# Patient Record
Sex: Female | Born: 1955 | Race: White | Hispanic: No | Marital: Married | Smoking: Never smoker
Health system: Southern US, Community
[De-identification: ages and names within clinical notes are randomized; demographics above are authoritative.]

## PROBLEM LIST (undated history)

## (undated) DIAGNOSIS — C4491 Basal cell carcinoma of skin, unspecified: Secondary | ICD-10-CM

## (undated) DIAGNOSIS — H04123 Dry eye syndrome of bilateral lacrimal glands: Secondary | ICD-10-CM

## (undated) DIAGNOSIS — F419 Anxiety disorder, unspecified: Secondary | ICD-10-CM

## (undated) DIAGNOSIS — M069 Rheumatoid arthritis, unspecified: Secondary | ICD-10-CM

## (undated) DIAGNOSIS — M1990 Unspecified osteoarthritis, unspecified site: Secondary | ICD-10-CM

## (undated) DIAGNOSIS — I499 Cardiac arrhythmia, unspecified: Secondary | ICD-10-CM

## (undated) DIAGNOSIS — F32A Depression, unspecified: Secondary | ICD-10-CM

## (undated) DIAGNOSIS — R200 Anesthesia of skin: Secondary | ICD-10-CM

## (undated) DIAGNOSIS — F329 Major depressive disorder, single episode, unspecified: Secondary | ICD-10-CM

## (undated) DIAGNOSIS — G4730 Sleep apnea, unspecified: Secondary | ICD-10-CM

## (undated) DIAGNOSIS — O2230 Deep phlebothrombosis in pregnancy, unspecified trimester: Secondary | ICD-10-CM

## (undated) HISTORY — DX: Basal cell carcinoma of skin, unspecified: C44.91

## (undated) HISTORY — DX: Anxiety disorder, unspecified: F41.9

## (undated) HISTORY — DX: Rheumatoid arthritis, unspecified: M06.9

## (undated) HISTORY — DX: Deep phlebothrombosis in pregnancy, unspecified trimester: O22.30

## (undated) HISTORY — DX: Depression, unspecified: F32.A

## (undated) HISTORY — DX: Major depressive disorder, single episode, unspecified: F32.9

---

## 1987-04-06 HISTORY — PX: KNEE ARTHROSCOPY: SUR90

## 2004-04-05 HISTORY — PX: ENDOMETRIAL ABLATION: SHX621

## 2004-04-05 HISTORY — PX: FACIAL COSMETIC SURGERY: SHX629

## 2007-04-06 DIAGNOSIS — O2230 Deep phlebothrombosis in pregnancy, unspecified trimester: Secondary | ICD-10-CM

## 2007-04-06 HISTORY — PX: KNEE ARTHROSCOPY: SUR90

## 2007-04-06 HISTORY — DX: Deep phlebothrombosis in pregnancy, unspecified trimester: O22.30

## 2007-06-06 ENCOUNTER — Ambulatory Visit (HOSPITAL_COMMUNITY): Admission: RE | Admit: 2007-06-06 | Discharge: 2007-06-06 | Payer: Self-pay | Admitting: Orthopedic Surgery

## 2007-06-06 ENCOUNTER — Encounter (INDEPENDENT_AMBULATORY_CARE_PROVIDER_SITE_OTHER): Payer: Self-pay | Admitting: Orthopedic Surgery

## 2007-06-06 ENCOUNTER — Ambulatory Visit: Payer: Self-pay | Admitting: Vascular Surgery

## 2007-06-06 ENCOUNTER — Encounter: Payer: Self-pay | Admitting: Internal Medicine

## 2007-06-07 ENCOUNTER — Telehealth: Payer: Self-pay | Admitting: Internal Medicine

## 2007-06-07 ENCOUNTER — Ambulatory Visit: Payer: Self-pay | Admitting: Internal Medicine

## 2007-06-07 DIAGNOSIS — I82409 Acute embolism and thrombosis of unspecified deep veins of unspecified lower extremity: Secondary | ICD-10-CM | POA: Insufficient documentation

## 2007-06-07 DIAGNOSIS — Z86718 Personal history of other venous thrombosis and embolism: Secondary | ICD-10-CM | POA: Insufficient documentation

## 2007-06-07 LAB — CONVERTED CEMR LAB
Albumin: 4 g/dL (ref 3.5–5.2)
Basophils Absolute: 0 10*3/uL (ref 0.0–0.1)
Bilirubin, Direct: 0.1 mg/dL (ref 0.0–0.3)
Calcium: 9.5 mg/dL (ref 8.4–10.5)
Chloride: 102 meq/L (ref 96–112)
Cholesterol: 185 mg/dL (ref 0–200)
Eosinophils Absolute: 0.3 10*3/uL (ref 0.0–0.6)
Eosinophils Relative: 4.5 % (ref 0.0–5.0)
GFR calc Af Amer: 113 mL/min
GFR calc non Af Amer: 93 mL/min
Glucose, Bld: 89 mg/dL (ref 70–99)
Glucose, Urine, Semiquant: NEGATIVE
INR: 1
Lymphocytes Relative: 26.3 % (ref 12.0–46.0)
MCHC: 34.2 g/dL (ref 30.0–36.0)
MCV: 92.9 fL (ref 78.0–100.0)
Neutro Abs: 3.4 10*3/uL (ref 1.4–7.7)
Neutrophils Relative %: 61.2 % (ref 43.0–77.0)
Nitrite: NEGATIVE
Platelets: 220 10*3/uL (ref 150–400)
Prothrombin Time: 12.2 s
RBC: 4.33 M/uL (ref 3.87–5.11)
Sodium: 137 meq/L (ref 135–145)
Specific Gravity, Urine: 1.02
TSH: 1.35 microintl units/mL (ref 0.35–5.50)
Total CHOL/HDL Ratio: 3.4
Triglycerides: 83 mg/dL (ref 0–149)
WBC Urine, dipstick: NEGATIVE
WBC: 5.6 10*3/uL (ref 4.5–10.5)
pH: 6

## 2007-06-09 ENCOUNTER — Ambulatory Visit: Payer: Self-pay | Admitting: Internal Medicine

## 2007-06-13 ENCOUNTER — Ambulatory Visit: Payer: Self-pay | Admitting: Internal Medicine

## 2007-06-15 ENCOUNTER — Other Ambulatory Visit: Admission: RE | Admit: 2007-06-15 | Discharge: 2007-06-15 | Payer: Self-pay | Admitting: Obstetrics & Gynecology

## 2007-06-15 ENCOUNTER — Ambulatory Visit: Payer: Self-pay | Admitting: Internal Medicine

## 2007-06-15 DIAGNOSIS — R7401 Elevation of levels of liver transaminase levels: Secondary | ICD-10-CM | POA: Insufficient documentation

## 2007-06-15 DIAGNOSIS — R7402 Elevation of levels of lactic acid dehydrogenase (LDH): Secondary | ICD-10-CM | POA: Insufficient documentation

## 2007-06-15 DIAGNOSIS — IMO0002 Reserved for concepts with insufficient information to code with codable children: Secondary | ICD-10-CM | POA: Insufficient documentation

## 2007-06-15 DIAGNOSIS — R740 Nonspecific elevation of levels of transaminase and lactic acid dehydrogenase [LDH]: Secondary | ICD-10-CM

## 2007-06-15 DIAGNOSIS — J309 Allergic rhinitis, unspecified: Secondary | ICD-10-CM | POA: Insufficient documentation

## 2007-06-15 LAB — CONVERTED CEMR LAB
HCV Ab: NEGATIVE
Hep A IgM: NEGATIVE
Hep B C IgM: NEGATIVE
Hepatitis B Surface Ag: NEGATIVE
INR: 2.8
Prothrombin Time: 20.2 s

## 2007-06-16 LAB — CONVERTED CEMR LAB
ALT: 72 units/L — ABNORMAL HIGH (ref 0–35)
AST: 29 units/L (ref 0–37)
Albumin: 4.1 g/dL (ref 3.5–5.2)
Alkaline Phosphatase: 49 units/L (ref 39–117)
Saturation Ratios: 30.5 % (ref 20.0–50.0)

## 2007-07-03 ENCOUNTER — Ambulatory Visit: Payer: Self-pay | Admitting: Cardiovascular Disease

## 2007-07-03 ENCOUNTER — Ambulatory Visit: Payer: Self-pay | Admitting: Internal Medicine

## 2007-07-05 ENCOUNTER — Ambulatory Visit: Payer: Self-pay | Admitting: Internal Medicine

## 2007-07-05 DIAGNOSIS — J0120 Acute ethmoidal sinusitis, unspecified: Secondary | ICD-10-CM | POA: Insufficient documentation

## 2007-07-05 LAB — CONVERTED CEMR LAB
AST: 18 units/L (ref 0–37)
Albumin: 4.1 g/dL (ref 3.5–5.2)
Total Bilirubin: 1.2 mg/dL (ref 0.3–1.2)

## 2007-07-11 ENCOUNTER — Encounter: Payer: Self-pay | Admitting: Internal Medicine

## 2007-08-03 ENCOUNTER — Ambulatory Visit: Payer: Self-pay | Admitting: Internal Medicine

## 2007-08-03 LAB — CONVERTED CEMR LAB
INR: 3.1
Prothrombin Time: 21.3 s

## 2007-08-23 ENCOUNTER — Ambulatory Visit: Payer: Self-pay

## 2007-08-23 ENCOUNTER — Ambulatory Visit: Payer: Self-pay | Admitting: Cardiovascular Disease

## 2007-08-30 ENCOUNTER — Telehealth: Payer: Self-pay | Admitting: Internal Medicine

## 2007-09-01 ENCOUNTER — Ambulatory Visit: Payer: Self-pay | Admitting: Internal Medicine

## 2007-09-06 ENCOUNTER — Ambulatory Visit: Payer: Self-pay | Admitting: Internal Medicine

## 2007-09-06 LAB — CONVERTED CEMR LAB
INR: 1.1
Prothrombin Time: 13.3 s

## 2007-09-11 LAB — CONVERTED CEMR LAB: Protein S Ag, Total: 92 % (ref 70–140)

## 2007-09-12 ENCOUNTER — Encounter: Payer: Self-pay | Admitting: Internal Medicine

## 2007-10-11 ENCOUNTER — Telehealth: Payer: Self-pay | Admitting: Internal Medicine

## 2007-10-12 ENCOUNTER — Ambulatory Visit: Payer: Self-pay | Admitting: Internal Medicine

## 2007-10-12 DIAGNOSIS — J329 Chronic sinusitis, unspecified: Secondary | ICD-10-CM | POA: Insufficient documentation

## 2007-10-13 ENCOUNTER — Encounter: Admission: RE | Admit: 2007-10-13 | Discharge: 2007-10-13 | Payer: Self-pay | Admitting: Internal Medicine

## 2007-10-27 ENCOUNTER — Telehealth: Payer: Self-pay | Admitting: Internal Medicine

## 2007-11-02 ENCOUNTER — Ambulatory Visit: Payer: Self-pay | Admitting: Internal Medicine

## 2007-11-02 LAB — CONVERTED CEMR LAB: Anticardiolipin IgM: <7 (ref <10)

## 2007-11-08 ENCOUNTER — Ambulatory Visit: Payer: Self-pay | Admitting: Cardiology

## 2007-11-10 ENCOUNTER — Telehealth: Payer: Self-pay | Admitting: Internal Medicine

## 2007-11-13 ENCOUNTER — Telehealth: Payer: Self-pay | Admitting: Internal Medicine

## 2007-11-14 ENCOUNTER — Telehealth: Payer: Self-pay | Admitting: Internal Medicine

## 2007-11-24 ENCOUNTER — Telehealth: Payer: Self-pay | Admitting: Internal Medicine

## 2007-11-28 ENCOUNTER — Ambulatory Visit: Payer: Self-pay | Admitting: Internal Medicine

## 2007-11-28 DIAGNOSIS — M67919 Unspecified disorder of synovium and tendon, unspecified shoulder: Secondary | ICD-10-CM | POA: Insufficient documentation

## 2007-11-28 DIAGNOSIS — M719 Bursopathy, unspecified: Secondary | ICD-10-CM

## 2007-12-04 ENCOUNTER — Telehealth: Payer: Self-pay | Admitting: Internal Medicine

## 2007-12-07 ENCOUNTER — Encounter: Admission: RE | Admit: 2007-12-07 | Discharge: 2007-12-07 | Payer: Self-pay | Admitting: Internal Medicine

## 2007-12-13 ENCOUNTER — Encounter: Payer: Self-pay | Admitting: Internal Medicine

## 2008-01-04 HISTORY — PX: NASAL POLYP SURGERY: SHX186

## 2008-01-10 ENCOUNTER — Encounter: Payer: Self-pay | Admitting: Internal Medicine

## 2008-01-30 ENCOUNTER — Encounter: Payer: Self-pay | Admitting: Internal Medicine

## 2008-02-14 ENCOUNTER — Encounter: Payer: Self-pay | Admitting: Internal Medicine

## 2008-03-20 ENCOUNTER — Encounter: Payer: Self-pay | Admitting: Internal Medicine

## 2008-04-09 ENCOUNTER — Telehealth: Payer: Self-pay | Admitting: Internal Medicine

## 2008-04-09 ENCOUNTER — Ambulatory Visit: Payer: Self-pay | Admitting: Internal Medicine

## 2008-04-09 DIAGNOSIS — N3000 Acute cystitis without hematuria: Secondary | ICD-10-CM | POA: Insufficient documentation

## 2008-04-10 ENCOUNTER — Encounter: Payer: Self-pay | Admitting: Internal Medicine

## 2008-04-12 ENCOUNTER — Telehealth: Payer: Self-pay | Admitting: Internal Medicine

## 2008-05-01 ENCOUNTER — Encounter: Payer: Self-pay | Admitting: Internal Medicine

## 2008-06-26 ENCOUNTER — Other Ambulatory Visit: Admission: RE | Admit: 2008-06-26 | Discharge: 2008-06-26 | Payer: Self-pay | Admitting: Obstetrics & Gynecology

## 2008-07-30 ENCOUNTER — Encounter: Payer: Self-pay | Admitting: Internal Medicine

## 2008-11-26 ENCOUNTER — Encounter: Payer: Self-pay | Admitting: Internal Medicine

## 2009-01-29 ENCOUNTER — Telehealth: Payer: Self-pay | Admitting: Internal Medicine

## 2009-05-08 ENCOUNTER — Ambulatory Visit: Payer: Self-pay | Admitting: Internal Medicine

## 2009-05-26 ENCOUNTER — Telehealth: Payer: Self-pay | Admitting: Internal Medicine

## 2009-06-05 ENCOUNTER — Ambulatory Visit: Payer: Self-pay | Admitting: Internal Medicine

## 2009-07-29 ENCOUNTER — Encounter: Payer: Self-pay | Admitting: Internal Medicine

## 2009-08-01 LAB — CONVERTED CEMR LAB: Pap Smear: NORMAL

## 2009-09-04 ENCOUNTER — Ambulatory Visit: Payer: Self-pay | Admitting: Internal Medicine

## 2009-09-04 DIAGNOSIS — R635 Abnormal weight gain: Secondary | ICD-10-CM | POA: Insufficient documentation

## 2009-12-18 ENCOUNTER — Ambulatory Visit: Payer: Self-pay | Admitting: Internal Medicine

## 2010-03-10 ENCOUNTER — Ambulatory Visit: Payer: Self-pay | Admitting: Internal Medicine

## 2010-03-10 LAB — CONVERTED CEMR LAB
ALT: 18 units/L (ref 0–35)
AST: 19 units/L (ref 0–37)
Albumin: 4.4 g/dL (ref 3.5–5.2)
Alkaline Phosphatase: 62 units/L (ref 39–117)
BUN: 14 mg/dL (ref 6–23)
Basophils Absolute: 0 10*3/uL (ref 0.0–0.1)
Calcium: 9.3 mg/dL (ref 8.4–10.5)
Chloride: 102 meq/L (ref 96–112)
Cholesterol: 236 mg/dL — ABNORMAL HIGH (ref 0–200)
Creatinine, Ser: 0.6 mg/dL (ref 0.4–1.2)
Eosinophils Absolute: 0.2 10*3/uL (ref 0.0–0.7)
Eosinophils Relative: 3.7 % (ref 0.0–5.0)
MCHC: 34.2 g/dL (ref 30.0–36.0)
MCV: 91.1 fL (ref 78.0–100.0)
Monocytes Absolute: 0.4 10*3/uL (ref 0.1–1.0)
Neutrophils Relative %: 57.4 % (ref 43.0–77.0)
Nitrite: NEGATIVE
Platelets: 198 10*3/uL (ref 150.0–400.0)
Protein, U semiquant: NEGATIVE
TSH: 2.14 microintl units/mL (ref 0.35–5.50)
Total Protein: 7.2 g/dL (ref 6.0–8.3)
Triglycerides: 99 mg/dL (ref 0.0–149.0)
Urobilinogen, UA: 0.2
VLDL: 19.8 mg/dL (ref 0.0–40.0)
WBC: 4.7 10*3/uL (ref 4.5–10.5)

## 2010-03-23 ENCOUNTER — Ambulatory Visit: Payer: Self-pay | Admitting: Internal Medicine

## 2010-03-23 DIAGNOSIS — E785 Hyperlipidemia, unspecified: Secondary | ICD-10-CM | POA: Insufficient documentation

## 2010-03-23 DIAGNOSIS — N943 Premenstrual tension syndrome: Secondary | ICD-10-CM | POA: Insufficient documentation

## 2010-03-23 DIAGNOSIS — F988 Other specified behavioral and emotional disorders with onset usually occurring in childhood and adolescence: Secondary | ICD-10-CM | POA: Insufficient documentation

## 2010-03-23 LAB — HM COLONOSCOPY

## 2010-03-24 ENCOUNTER — Telehealth: Payer: Self-pay | Admitting: *Deleted

## 2010-04-17 ENCOUNTER — Telehealth: Payer: Self-pay | Admitting: Internal Medicine

## 2010-05-05 NOTE — Assessment & Plan Note (Signed)
Summary: 1 month fup//ccm   Vital Signs:  Patient profile:   55 year old female Height:      66 inches Weight:      185 pounds BMI:     29.97 Temp:     98.2 degrees F oral Pulse rate:   76 / minute Resp:     14 per minute BP sitting:   132 / 80  (left arm)  Vitals Entered By: Willy Eddy, LPN (June 06, 4130 12:10 PM) CC: roa- -wellbutrin caused hives which are improving but contniues to take benadryl each night- pristiq is working well with no side effects   Primary Care Provider:  [name] MD  CC:  roa- -wellbutrin caused hives which are improving but contniues to take benadryl each night- pristiq is working well with no side effects.  History of Present Illness: Just had a flu like ilness good response to  pristiq mild episodes of SOB with walking but can do the treadmill without and new SOB  Preventive Screening-Counseling & Management  Alcohol-Tobacco     Smoking Status: never     Passive Smoke Exposure: no  Current Problems (verified): 1)  ? of Obstructive Sleep Apnea  (ICD-327.23) 2)  Acute Cystitis  (ICD-595.0) 3)  Rotator Cuff Syndrome, Left  (ICD-726.10) 4)  Sinusitis, Chronic  (ICD-473.9) 5)  Acute Ethmoidal Sinusitis  (ICD-461.2) 6)  Transaminases, Serum, Elevated  (ICD-790.4) 7)  Medial Meniscus Tear, Left  (ICD-836.0) 8)  Depression  (ICD-311) 9)  Allergic Rhinitis  (ICD-477.9) 10)  Coumadin Therapy  (ICD-V58.61) 11)  Encounter For Therapeutic Drug Monitoring  (ICD-V58.83) 12)  Dvt  (ICD-453.40) 13)  Physical Examination  (ICD-V70.0)  Current Medications (verified): 1)  Lexapro 20 Mg Tabs (Escitalopram Oxalate) .Marland Kitchen.. 1 Once Daily 2)  Omnaris 50 Mcg/act  Susp (Ciclesonide) .... Two Spary To Each Nostril Once A Day 3)  Pristiq 50 Mg Xr24h-Tab (Desvenlafaxine Succinate) .... One By Mouth Dailt  Allergies (verified): 1)  ! Wellbutrin Xl (Bupropion Hcl)  Past History:  Family History: Last updated: 05/08/2009 Father: living-51 years  old Mother: living 63years old Siblings: 2 brothers that are healthy  daughter with elevated testosterone  Social History: Last updated: 06/15/2007 Married Never Smoked  Risk Factors: Smoking Status: never (06/05/2009) Passive Smoke Exposure: no (06/05/2009)  Past medical, surgical, family and social histories (including risk factors) reviewed, and no changes noted (except as noted below).  Past Medical History: Reviewed history from 11/28/2007 and no changes required. Allergic rhinitis Depression meniscal tear  Past Surgical History: Reviewed history from 08/03/2007 and no changes required. arthroscopy for knee  Family History: Reviewed history from 05/08/2009 and no changes required. Father: living-16 years old Mother: living 38years old Siblings: 2 brothers that are healthy  daughter with elevated testosterone  Social History: Reviewed history from 06/15/2007 and no changes required. Married Never Smoked  Review of Systems  The patient denies anorexia, fever, weight loss, weight gain, vision loss, decreased hearing, hoarseness, chest pain, syncope, dyspnea on exertion, peripheral edema, prolonged cough, headaches, hemoptysis, abdominal pain, melena, severe indigestion/heartburn, hematuria, incontinence, genital sores, muscle weakness, suspicious skin lesions, transient blindness, difficulty walking, depression, unusual weight change, abnormal bleeding, enlarged lymph nodes, angioedema, and breast masses.    Physical Exam  General:  Well-developed, well-nourished, normal body habitus; no deformities, normal grooming.   Eyes:  pupils equal and pupils round.   Ears:  R ear normal and L ear normal.   Nose:  mucosal edema, airflow obstruction, and  R nasal polyp.   Mouth:  Oral mucosa and oropharynx without lesions or exudates.  Teeth in good repair. Neck:  No deformities, masses, or tenderness noted. Lungs:  Normal respiratory effort, chest expands symmetrically. Lungs  are clear to auscultation, no crackles or wheezes. Heart:  Normal rate and regular rhythm. S1 and S2 normal without gallop, murmur, click, rub or other extra sounds.   Impression & Recommendations:  Problem # 1:  DEPRESSION (ICD-311)  The following medications were removed from the medication list:    Lexapro 20 Mg Tabs (Escitalopram oxalate) .Marland Kitchen... 1 once daily Her updated medication list for this problem includes:    Pristiq 50 Mg Xr24h-tab (Desvenlafaxine succinate) ..... One by mouth dailt  Discussed treatment options, including trial of antidpressant medication. Will refer to behavioral health. Follow-up call in in 24-48 hours and recheck in 2 weeks, sooner as needed. Patient agrees to call if any worsening of symptoms or thoughts of doing harm arise. Verified that the patient has no suicidal ideation at this time.   Problem # 2:  COUMADIN THERAPY (ICD-V58.61)  Complete Medication List: 1)  Omnaris 50 Mcg/act Susp (Ciclesonide) .... Two spary to each nostril once a day 2)  Pristiq 50 Mg Xr24h-tab (Desvenlafaxine succinate) .... One by mouth dailt 3)  Phentermine Hcl 37.5 Mg Caps (Phentermine hcl) .... One by mouth q am  Patient Instructions: 1)  Please schedule a follow-up appointment in 3 months. 2)  DASH diet Prescriptions: PHENTERMINE HCL 37.5 MG CAPS (PHENTERMINE HCL) one by mouth q AM  #30 x 2   Entered and Authorized by:   [name] MD   Signed by:   [name] MD on 06/05/2009   Method used:   Print then Give to Patient   RxID:   [id number]

## 2010-05-05 NOTE — Progress Notes (Signed)
Summary: switch meds?  Phone Note Call from Patient   Caller: Patient Call For: [name] MD Summary of Call: started back on Wellbutrin 3 weeks ago- hands and feet are itching. Should she give it more time or switch to another med? [facility] her ph [phone] Initial call taken by: Raechel Ache, RN,  May 26, 2009 10:22 AM  Follow-up for Phone Call        trial of pristiq 50 mg  add to med list to replace welbutrin may call in one by mouth daily Follow-up by: [name] MD,  May 26, 2009 2:31 PM    New/Updated Medications: PRISTIQ 50 MG XR24H-TAB (DESVENLAFAXINE SUCCINATE) one by mouth dailt Prescriptions: PRISTIQ 50 MG XR24H-TAB (DESVENLAFAXINE SUCCINATE) one by mouth dailt  #50 x 1   Entered by:   Lynann Beaver CMA   Authorized by:   [name] MD   Signed by:   Lynann Beaver CMA on 05/26/2009   Method used:   Electronically to        [id number]* (retail)       [address]       [place], [place]  [address]       Ph: [phone]       Fax: [phone]   RxID:   [id number]

## 2010-05-05 NOTE — Letter (Signed)
Summary: [facility]  [facility]   Imported By: Maryln Gottron 08/06/2009 14:43:26  _____________________________________________________________________  External Attachment:    Type:   Image     Comment:   External Document

## 2010-05-05 NOTE — Assessment & Plan Note (Signed)
Summary: CONCERNS ABOUT SLEEP APNEA // RS   Vital Signs:  Patient profile:   55 year old female Height:      66 inches Weight:      188 pounds BMI:     30.45 Temp:     98.2 degrees F oral Pulse rate:   76 / minute Resp:     14 per minute BP sitting:   134 / 80  (left arm)  Vitals Entered By: Willy Eddy, LPN (May 08, 2009 12:55 PM)  Nutrition Counseling: Patient's BMI is greater than 25 and therefore counseled on weight management options. CC: to discuss blood clotting problem/c/o snoring   Primary Care Provider:  [name] MD  CC:  to discuss blood clotting problem/c/o snoring.  History of Present Illness: OV to discuss hx of DVT and   her desire to consider HRT OV to reviewed apnea pt possible has obstructive type apnea with snoring due to hx of nasal polyps ( see [facility] ENt NOTE) She does not have excessive daytime sleepyness at this time but snores loudly at night she is not obese I have spent greater that 30 min face to face evaluating this patient over 1/2 was counsilling  Preventive Screening-Counseling & Management  Alcohol-Tobacco     Smoking Status: never     Passive Smoke Exposure: no  Problems Prior to Update: 1)  Acute Cystitis  (ICD-595.0) 2)  Rotator Cuff Syndrome, Left  (ICD-726.10) 3)  Sinusitis, Chronic  (ICD-473.9) 4)  Acute Ethmoidal Sinusitis  (ICD-461.2) 5)  Transaminases, Serum, Elevated  (ICD-790.4) 6)  Medial Meniscus Tear, Left  (ICD-836.0) 7)  Depression  (ICD-311) 8)  Allergic Rhinitis  (ICD-477.9) 9)  Coumadin Therapy  (ICD-V58.61) 10)  Encounter For Therapeutic Drug Monitoring  (ICD-V58.83) 11)  Dvt  (ICD-453.40) 12)  Physical Examination  (ICD-V70.0)  Current Problems (verified): 1)  Acute Cystitis  (ICD-595.0) 2)  Rotator Cuff Syndrome, Left  (ICD-726.10) 3)  Sinusitis, Chronic  (ICD-473.9) 4)  Acute Ethmoidal Sinusitis  (ICD-461.2) 5)  Transaminases, Serum, Elevated  (ICD-790.4) 6)  Medial Meniscus Tear,  Left  (ICD-836.0) 7)  Depression  (ICD-311) 8)  Allergic Rhinitis  (ICD-477.9) 9)  Coumadin Therapy  (ICD-V58.61) 10)  Encounter For Therapeutic Drug Monitoring  (ICD-V58.83) 11)  Dvt  (ICD-453.40) 12)  Physical Examination  (ICD-V70.0)  Medications Prior to Update: 1)  Prozac 20 Mg  Caps (Fluoxetine Hcl) .... Once Daily 2)  Omnaris 50 Mcg/act  Susp (Ciclesonide) .... Two Spary To Each Nostril Once A Day 3)  Prednisone 2.5 Mg Tabs (Prednisone) .Marland Kitchen.. 1 Once Daily Titrating Off Since Nasal Surgery 4)  Levaquin 750 Mg Tabs (Levofloxacin) .... One By Mouth Daily For 3 Days  Current Medications (verified): 1)  Lexapro 20 Mg Tabs (Escitalopram Oxalate) .Marland Kitchen.. 1 Once Daily 2)  Omnaris 50 Mcg/act  Susp (Ciclesonide) .... Two Spary To Each Nostril Once A Day 3)  Bupropion Hcl 150 Mg Xr24h-Tab (Bupropion Hcl) .... One By Mouth Q Am  Allergies (verified): No Known Drug Allergies  Past History:  Family History: Last updated: 05/08/2009 Father: living-69 years old Mother: living 81years old Siblings: 2 brothers that are healthy  daughter with elevated testosterone  Social History: Last updated: 06/15/2007 Married Never Smoked  Risk Factors: Smoking Status: never (05/08/2009) Passive Smoke Exposure: no (05/08/2009)  Past medical, surgical, family and social histories (including risk factors) reviewed, and no changes noted (except as noted below).  Past Medical History: Reviewed history from 11/28/2007 and no  changes required. Allergic rhinitis Depression meniscal tear  Past Surgical History: Reviewed history from 08/03/2007 and no changes required. arthroscopy for knee  Family History: Reviewed history from 06/15/2007 and no changes required. Father: living-38 years old Mother: living 28years old Siblings: 2 brothers that are healthy  daughter with elevated testosterone  Social History: Reviewed history from 06/15/2007 and no changes required. Married Never  Smoked  Review of Systems  The patient denies anorexia, fever, weight loss, weight gain, vision loss, decreased hearing, hoarseness, chest pain, syncope, dyspnea on exertion, peripheral edema, prolonged cough, headaches, hemoptysis, abdominal pain, melena, hematochezia, severe indigestion/heartburn, hematuria, incontinence, genital sores, muscle weakness, suspicious skin lesions, transient blindness, difficulty walking, depression, unusual weight change, abnormal bleeding, enlarged lymph nodes, angioedema, and breast masses.    Physical Exam  General:  Well-developed, well-nourished, normal body habitus; no deformities, normal grooming.   Eyes:  pupils equal and pupils round.   Ears:  R ear normal and L ear normal.   Nose:  mucosal edema, airflow obstruction, and R nasal polyp.   Mouth:  Oral mucosa and oropharynx without lesions or exudates.  Teeth in good repair. Neck:  No deformities, masses, or tenderness noted. Lungs:  Normal respiratory effort, chest expands symmetrically. Lungs are clear to auscultation, no crackles or wheezes. Heart:  Normal rate and regular rhythm. S1 and S2 normal without gallop, murmur, click, rub or other extra sounds. Abdomen:  Bowel sounds positive,abdomen soft and non-tender without masses, organomegaly or hernias noted.   Impression & Recommendations:  Problem # 1:  DVT (ICD-453.40) Assessment Unchanged monitering hx of dvt with desire to resum HRT for symptomatice menopause Orders: Venipuncture ([address]) T- * Misc. Laboratory test [phone])  Problem # 2:  DEPRESSION (ICD-311) weigth gain failed weight watchers menopausal mood improved with change from prozac  Her updated medication list for this problem includes:    Lexapro 20 Mg Tabs (Escitalopram oxalate) .Marland Kitchen... 1 once daily    Bupropion Hcl 150 Mg Xr24h-tab (Bupropion hcl) ..... One by mouth q am  Discussed treatment options, including trial of antidpressant medication. Will refer to behavioral  health. Follow-up call in in 24-48 hours and recheck in 2 weeks, sooner as needed. Patient agrees to call if any worsening of symptoms or thoughts of doing harm arise. Verified that the patient has no suicidal ideation at this time.   Problem # 3:  SINUSITIS, CHRONIC (ICD-473.9) Assessment: Deteriorated due to the snoirn add nasal sterioid and consider formal sleep study The following medications were removed from the medication list:    Levaquin 750 Mg Tabs (Levofloxacin) ..... One by mouth daily for 3 days Her updated medication list for this problem includes:    Omnaris 50 Mcg/act Susp (Ciclesonide) .Marland Kitchen..Marland Kitchen Two spary to each nostril once a day  Take antibiotics for full duration. Discussed treatment options including indications for coronal CT scan of sinuses and ENT referral.   Complete Medication List: 1)  Lexapro 20 Mg Tabs (Escitalopram oxalate) .Marland Kitchen.. 1 once daily 2)  Omnaris 50 Mcg/act Susp (Ciclesonide) .... Two spary to each nostril once a day 3)  Bupropion Hcl 150 Mg Xr24h-tab (Bupropion hcl) .... One by mouth q am  Patient Instructions: 1)  Please schedule a follow-up appointment in 1 month. 2)  cut the lexapro in 1/2 for now Prescriptions: BUPROPION HCL 150 MG XR24H-TAB (BUPROPION HCL) one by mouth q AM  #30 x 6   Entered by:   Willy Eddy, LPN   Authorized by:   Balinda Quails  Lovell Sheehan MD   Signed by:   Willy Eddy, LPN on 13/11/6576   Method used:   Electronically to        [facility] [id number]* (retail)       [address]       [place], [place]  [address]       Ph: [phone]       Fax: [phone]   RxID:   [id number]

## 2010-05-05 NOTE — Assessment & Plan Note (Signed)
Summary: 3 month rov.//njr [facility] BMP/NJR   Vital Signs:  Patient profile:   55 year old female Height:      66 inches Weight:      166 pounds BMI:     26.89 Temp:     98.2 degrees F oral Pulse rate:   68 / minute Resp:     14 per minute BP sitting:   120 / 70  (left arm)  Vitals Entered By: Willy Eddy, LPN (December 18, 2009 12:11 PM) CC: roa Is Patient Diabetic? No   Primary Care Provider:  [name] MD  CC:  roa.  History of Present Illness: The phenterimine did not work she is discussing the optifast  vs the HcG diet we dicussed the rationsal for the optifast  I have spent greater that 30 min face to face evaluating this patient of which 20 min was in counsilling for weight loss efforts and review of exisiting diet  Preventive Screening-Counseling & Management  Alcohol-Tobacco     Smoking Status: never     Passive Smoke Exposure: no  Problems Prior to Update: 1)  Weight Gain  (ICD-783.1) 2)  ? of Obstructive Sleep Apnea  (ICD-327.23) 3)  Acute Cystitis  (ICD-595.0) 4)  Rotator Cuff Syndrome, Left  (ICD-726.10) 5)  Sinusitis, Chronic  (ICD-473.9) 6)  Acute Ethmoidal Sinusitis  (ICD-461.2) 7)  Transaminases, Serum, Elevated  (ICD-790.4) 8)  Medial Meniscus Tear, Left  (ICD-836.0) 9)  Depression  (ICD-311) 10)  Allergic Rhinitis  (ICD-477.9) 11)  Coumadin Therapy  (ICD-V58.61) 12)  Encounter For Therapeutic Drug Monitoring  (ICD-V58.83) 13)  Dvt  (ICD-453.40) 14)  Physical Examination  (ICD-V70.0)  Current Problems (verified): 1)  Weight Gain  (ICD-783.1) 2)  ? of Obstructive Sleep Apnea  (ICD-327.23) 3)  Acute Cystitis  (ICD-595.0) 4)  Rotator Cuff Syndrome, Left  (ICD-726.10) 5)  Sinusitis, Chronic  (ICD-473.9) 6)  Acute Ethmoidal Sinusitis  (ICD-461.2) 7)  Transaminases, Serum, Elevated  (ICD-790.4) 8)  Medial Meniscus Tear, Left  (ICD-836.0) 9)  Depression  (ICD-311) 10)  Allergic Rhinitis  (ICD-477.9) 11)  Coumadin Therapy   (ICD-V58.61) 12)  Encounter For Therapeutic Drug Monitoring  (ICD-V58.83) 13)  Dvt  (ICD-453.40) 14)  Physical Examination  (ICD-V70.0)  Medications Prior to Update: 1)  Omnaris 50 Mcg/act  Susp (Ciclesonide) .... Two Spary To Each Nostril Once A Day 2)  Pristiq 50 Mg Xr24h-Tab (Desvenlafaxine Succinate) .... One By Mouth Dailt 3)  Phentermine Hcl 37.5 Mg Caps (Phentermine Hcl) .... One By Mouth Daily  Current Medications (verified): 1)  Omnaris 50 Mcg/act  Susp (Ciclesonide) .... Two Spary To Each Nostril Once A Day 2)  Pristiq 50 Mg Xr24h-Tab (Desvenlafaxine Succinate) .... One By Mouth Dailt 3)  Diclofenac Sodium 50 Mg Tbec (Diclofenac Sodium) .Marland Kitchen.. 1 Once Daily  Allergies (verified): 1)  ! Wellbutrin Xl (Bupropion Hcl)  Past History:  Family History: Last updated: 05/08/2009 Father: living-71 years old Mother: living 24years old Siblings: 2 brothers that are healthy  daughter with elevated testosterone  Social History: Last updated: 06/15/2007 Married Never Smoked  Risk Factors: Smoking Status: never (12/18/2009) Passive Smoke Exposure: no (12/18/2009)  Past medical, surgical, family and social histories (including risk factors) reviewed, and no changes noted (except as noted below).  Past Medical History: Reviewed history from 11/28/2007 and no changes required. Allergic rhinitis Depression meniscal tear  Past Surgical History: Reviewed history from 08/03/2007 and no changes required. arthroscopy for knee  Family History: Reviewed history from 05/08/2009 and  no changes required. Father: living-12 years old Mother: living 41years old Siblings: 2 brothers that are healthy  daughter with elevated testosterone  Social History: Reviewed history from 06/15/2007 and no changes required. Married Never Smoked  Review of Systems       Flu Vaccine Consent Questions     Do you have a history of severe allergic reactions to this vaccine? no    Any prior history  of allergic reactions to egg and/or gelatin? no    Do you have a sensitivity to the preservative Thimersol? no    Do you have a past history of Guillan-Barre Syndrome? no    Do you currently have an acute febrile illness? no    Have you ever had a severe reaction to latex? no    Vaccine information given and explained to patient? yes    Are you currently pregnant? no    Lot Number:[id number]   Exp Date:10/03/2010   Site Given  Left Deltoid IM   Physical Exam  General:  Well-developed, well-nourished, normal body habitus; no deformities, normal grooming.   Eyes:  pupils equal and pupils round.   Ears:  R ear normal and L ear normal.   Nose:  mucosal edema, airflow obstruction, and R nasal polyp.   Mouth:  Oral mucosa and oropharynx without lesions or exudates.  Teeth in good repair. Neck:  No deformities, masses, or tenderness noted. Lungs:  Normal respiratory effort, chest expands symmetrically. Lungs are clear to auscultation, no crackles or wheezes. Heart:  Normal rate and regular rhythm. S1 and S2 normal without gallop, murmur, click, rub or other extra sounds.   Impression & Recommendations:  Problem # 1:  DEPRESSION (ICD-311)  Her updated medication list for this problem includes:    Pristiq 50 Mg Xr24h-tab (Desvenlafaxine succinate) ..... One by mouth dailt  Discussed treatment options, including trial of antidpressant medication. Will refer to behavioral health. Follow-up call in in 24-48 hours and recheck in 2 weeks, sooner as needed. Patient agrees to call if any worsening of symptoms or thoughts of doing harm arise. Verified that the patient has no suicidal ideation at this time.   Problem # 2:  WEIGHT GAIN (ICD-783.1) Assessment: Unchanged  Orders: Nutrition Referral (Nutrition)  Problem # 3:  SINUSITIS, CHRONIC (ICD-473.9)  stable, has hx of polyps and is being montered at [facility] Her updated medication list for this problem includes:    Omnaris 50 Mcg/act Susp  (Ciclesonide) .Marland Kitchen..Marland Kitchen Two spary to each nostril once a day  Take antibiotics for full duration. Discussed treatment options including indications for coronal CT scan of sinuses and ENT referral.   Problem # 4:  ALLERGIC RHINITIS (ICD-477.9)  Her updated medication list for this problem includes:    Omnaris 50 Mcg/act Susp (Ciclesonide) .Marland Kitchen..Marland Kitchen Two spary to each nostril once a day  Discussed use of allergy medications and environmental measures.   Problem # 5:  WEIGHT GAIN (ICD-783.1)  Orders: Nutrition Referral (Nutrition)  Complete Medication List: 1)  Omnaris 50 Mcg/act Susp (Ciclesonide) .... Two spary to each nostril once a day 2)  Pristiq 50 Mg Xr24h-tab (Desvenlafaxine succinate) .... One by mouth dailt 3)  Diclofenac Sodium 50 Mg Tbec (Diclofenac sodium) .Marland Kitchen.. 1 once daily  Other Orders: Admin 1st Vaccine (57846) Flu Vaccine 7yrs + (96295)  Patient Instructions: 1)  Please schedule a follow-up appointment in 4-11months.  CPX

## 2010-05-05 NOTE — Assessment & Plan Note (Signed)
Summary: 3 month fup//ccm   Vital Signs:  Patient profile:   55 year old female Height:      66 inches Weight:      165 pounds BMI:     26.73 Temp:     98.2 degrees F oral Pulse rate:   72 / minute Resp:     14 per minute BP sitting:   122 / 80  (left arm)  Vitals Entered By: Willy Eddy, LPN (September 04, 1608 11:56 AM) CC: roa   Primary Care Provider:  [name] MD  CC:  roa.  History of Present Illness: The results of the sinus surveilence showed minute  polyps with persistant imporvemnt from the presurgical state. Weigth loss and metabolic discussion moold stable review of diet and fitness I have spent greater that 30 min face to face evaluating this patient   Preventive Screening-Counseling & Management  Alcohol-Tobacco     Smoking Status: never     Passive Smoke Exposure: no  Problems Prior to Update: 1)  Weight Gain  (ICD-783.1) 2)  ? of Obstructive Sleep Apnea  (ICD-327.23) 3)  Acute Cystitis  (ICD-595.0) 4)  Rotator Cuff Syndrome, Left  (ICD-726.10) 5)  Sinusitis, Chronic  (ICD-473.9) 6)  Acute Ethmoidal Sinusitis  (ICD-461.2) 7)  Transaminases, Serum, Elevated  (ICD-790.4) 8)  Medial Meniscus Tear, Left  (ICD-836.0) 9)  Depression  (ICD-311) 10)  Allergic Rhinitis  (ICD-477.9) 11)  Coumadin Therapy  (ICD-V58.61) 12)  Encounter For Therapeutic Drug Monitoring  (ICD-V58.83) 13)  Dvt  (ICD-453.40) 14)  Physical Examination  (ICD-V70.0)  Current Problems (verified): 1)  ? of Obstructive Sleep Apnea  (ICD-327.23) 2)  Acute Cystitis  (ICD-595.0) 3)  Rotator Cuff Syndrome, Left  (ICD-726.10) 4)  Sinusitis, Chronic  (ICD-473.9) 5)  Acute Ethmoidal Sinusitis  (ICD-461.2) 6)  Transaminases, Serum, Elevated  (ICD-790.4) 7)  Medial Meniscus Tear, Left  (ICD-836.0) 8)  Depression  (ICD-311) 9)  Allergic Rhinitis  (ICD-477.9) 10)  Coumadin Therapy  (ICD-V58.61) 11)  Encounter For Therapeutic Drug Monitoring  (ICD-V58.83) 12)  Dvt  (ICD-453.40) 13)   Physical Examination  (ICD-V70.0)  Medications Prior to Update: 1)  Omnaris 50 Mcg/act  Susp (Ciclesonide) .... Two Spary To Each Nostril Once A Day 2)  Pristiq 50 Mg Xr24h-Tab (Desvenlafaxine Succinate) .... One By Mouth Dailt 3)  Phentermine Hcl 37.5 Mg Caps (Phentermine Hcl) .... One By Mouth Q Am  Current Medications (verified): 1)  Omnaris 50 Mcg/act  Susp (Ciclesonide) .... Two Spary To Each Nostril Once A Day 2)  Pristiq 50 Mg Xr24h-Tab (Desvenlafaxine Succinate) .... One By Mouth Dailt 3)  Phentermine Hcl 37.5 Mg Caps (Phentermine Hcl) .... One By Mouth Daily  Allergies (verified): 1)  ! Wellbutrin Xl (Bupropion Hcl)  Past History:  Family History: Last updated: 05/08/2009 Father: living-73 years old Mother: living 44years old Siblings: 2 brothers that are healthy  daughter with elevated testosterone  Social History: Last updated: 06/15/2007 Married Never Smoked  Risk Factors: Smoking Status: never (09/04/2009) Passive Smoke Exposure: no (09/04/2009)  Past medical, surgical, family and social histories (including risk factors) reviewed, and no changes noted (except as noted below).  Past Medical History: Reviewed history from 11/28/2007 and no changes required. Allergic rhinitis Depression meniscal tear  Past Surgical History: Reviewed history from 08/03/2007 and no changes required. arthroscopy for knee  Family History: Reviewed history from 05/08/2009 and no changes required. Father: living-59 years old Mother: living 26years old Siblings: 2 brothers that are healthy  daughter with elevated testosterone  Social History: Reviewed history from 06/15/2007 and no changes required. Married Never Smoked  Review of Systems  The patient denies anorexia, fever, weight loss, weight gain, vision loss, decreased hearing, hoarseness, chest pain, syncope, dyspnea on exertion, peripheral edema, prolonged cough, headaches, hemoptysis, abdominal pain, melena,  hematochezia, severe indigestion/heartburn, hematuria, incontinence, genital sores, muscle weakness, suspicious skin lesions, transient blindness, difficulty walking, depression, unusual weight change, abnormal bleeding, enlarged lymph nodes, angioedema, and breast masses.    Physical Exam  General:  Well-developed, well-nourished, normal body habitus; no deformities, normal grooming.   Eyes:  pupils equal and pupils round.   Ears:  R ear normal and L ear normal.   Nose:  mucosal edema, airflow obstruction, and R nasal polyp.   Mouth:  Oral mucosa and oropharynx without lesions or exudates.  Teeth in good repair. Neck:  No deformities, masses, or tenderness noted. Lungs:  Normal respiratory effort, chest expands symmetrically. Lungs are clear to auscultation, no crackles or wheezes. Heart:  Normal rate and regular rhythm. S1 and S2 normal without gallop, murmur, click, rub or other extra sounds. Abdomen:  Bowel sounds positive,abdomen soft and non-tender without masses, organomegaly or hernias noted.   Impression & Recommendations:  Problem # 1:  SINUSITIS, CHRONIC (ICD-473.9)  Her updated medication list for this problem includes:    Omnaris 50 Mcg/act Susp (Ciclesonide) .Marland Kitchen..Marland Kitchen Two spary to each nostril once a day  Take antibiotics for full duration. Discussed treatment options including indications for coronal CT scan of sinuses and ENT referral.   Problem # 2:  WEIGHT GAIN (ICD-783.1) the pheterimine helped weight loss  Problem # 3:  TRANSAMINASES, SERUM, ELEVATED (ICD-790.4) moniter  Problem # 4:  DEPRESSION (ICD-311) Assessment: Unchanged reviewee medications and effects Her updated medication list for this problem includes:    Pristiq 50 Mg Xr24h-tab (Desvenlafaxine succinate) ..... One by mouth dailt  Discussed treatment options, including trial of antidpressant medication. Will refer to behavioral health. Follow-up call in in 24-48 hours and recheck in 2 weeks, sooner as  needed. Patient agrees to call if any worsening of symptoms or thoughts of doing harm arise. Verified that the patient has no suicidal ideation at this time.   Complete Medication List: 1)  Omnaris 50 Mcg/act Susp (Ciclesonide) .... Two spary to each nostril once a day 2)  Pristiq 50 Mg Xr24h-tab (Desvenlafaxine succinate) .... One by mouth dailt 3)  Phentermine Hcl 37.5 Mg Caps (Phentermine hcl) .... One by mouth daily  Patient Instructions: 1)  "dr oz hcg diet" 2)  Please schedule a follow-up appointment in 3 months.  Prescriptions: PHENTERMINE HCL 37.5 MG CAPS (PHENTERMINE HCL) one by mouth daily  #30 x 2   Entered and Authorized by:   [name] MD   Signed by:   [name] MD on 09/04/2009   Method used:   Print then Give to Patient   RxID:   [id number]

## 2010-05-07 NOTE — Progress Notes (Signed)
Summary: please fax lab results  Phone Note Call from Patient Call back at Work Phone [phone]   Caller: Patient---live call Reason for Call: Lab or Test Results Summary of Call: pt would like for her labs to be sent to Dr Leda Quail ----fax---[phone]. Initial call taken by: Warnell Forester,  April 17, 2010 10:12 AM  Follow-up for Phone Call        done Follow-up by: Willy Eddy, LPN,  April 17, 2010 10:47 AM

## 2010-05-07 NOTE — Assessment & Plan Note (Signed)
Summary: CPX // RS---PT [facility] // RS rsc bmp/njr   Vital Signs:  Patient profile:   55 year old female Height:      66 inches Weight:      174 pounds BMI:     28.19 Temp:     98.2 degrees F oral Pulse rate:   68 / minute Resp:     14 per minute BP sitting:   138 / 82  (left arm)  Vitals Entered By: Willy Eddy, LPN (March 23, 2010 12:08 PM) CC: cpx, Lipid Management Is Patient Diabetic? No   Primary Care Provider:  [name] MD  CC:  cpx and Lipid Management.  History of Present Illness: The pt was asked about all immunizations, health maint. services that are appropriate to their age and was given guidance on diet exercize  and weight management  The  pts lipid panel has deteriorated weight gain recent increase in pristiq hx of ADD? no testing but both childern have ben diagnosed has issues with focus and hx of depression ?PMDD  Lipid Management History:      Positive NCEP/ATP III risk factors include early menopause without estrogen hormone replacement.  Negative NCEP/ATP III risk factors include female age less than 55 years old, HDL cholesterol greater than 60, no family history for ischemic heart disease, non-tobacco-user status, non-hypertensive, no ASHD (atherosclerotic heart disease), no prior stroke/TIA, no peripheral vascular disease, and no history of aortic aneurysm.     Preventive Screening-Counseling & Management  Alcohol-Tobacco     Smoking Status: never     Passive Smoke Exposure: no  Problems Prior to Update: 1)  Weight Gain  (ICD-783.1) 2)  ? of Obstructive Sleep Apnea  (ICD-327.23) 3)  Acute Cystitis  (ICD-595.0) 4)  Rotator Cuff Syndrome, Left  (ICD-726.10) 5)  Sinusitis, Chronic  (ICD-473.9) 6)  Acute Ethmoidal Sinusitis  (ICD-461.2) 7)  Transaminases, Serum, Elevated  (ICD-790.4) 8)  Medial Meniscus Tear, Left  (ICD-836.0) 9)  Depression  (ICD-311) 10)  Allergic Rhinitis  (ICD-477.9) 11)  Coumadin Therapy  (ICD-V58.61) 12)   Encounter For Therapeutic Drug Monitoring  (ICD-V58.83) 13)  Dvt  (ICD-453.40) 14)  Physical Examination  (ICD-V70.0)  Current Problems (verified): 1)  Weight Gain  (ICD-783.1) 2)  ? of Obstructive Sleep Apnea  (ICD-327.23) 3)  Acute Cystitis  (ICD-595.0) 4)  Rotator Cuff Syndrome, Left  (ICD-726.10) 5)  Sinusitis, Chronic  (ICD-473.9) 6)  Acute Ethmoidal Sinusitis  (ICD-461.2) 7)  Transaminases, Serum, Elevated  (ICD-790.4) 8)  Medial Meniscus Tear, Left  (ICD-836.0) 9)  Depression  (ICD-311) 10)  Allergic Rhinitis  (ICD-477.9) 11)  Coumadin Therapy  (ICD-V58.61) 12)  Encounter For Therapeutic Drug Monitoring  (ICD-V58.83) 13)  Dvt  (ICD-453.40) 14)  Physical Examination  (ICD-V70.0)  Medications Prior to Update: 1)  Omnaris 50 Mcg/act  Susp (Ciclesonide) .... Two Spary To Each Nostril Once A Day 2)  Pristiq 50 Mg Xr24h-Tab (Desvenlafaxine Succinate) .... One By Mouth Dailt 3)  Diclofenac Sodium 50 Mg Tbec (Diclofenac Sodium) .Marland Kitchen.. 1 Once Daily  Current Medications (verified): 1)  Omnaris 50 Mcg/act  Susp (Ciclesonide) .... Two Spary To Each Nostril Once A Day 2)  Pristiq 100 Mg Xr24h-Tab (Desvenlafaxine Succinate) .Marland Kitchen.. 1 Once Daily 3)  Diclofenac Sodium 50 Mg Tbec (Diclofenac Sodium) .Marland Kitchen.. 1 Once Daily  Allergies (verified): 1)  ! Wellbutrin Xl (Bupropion Hcl)  Past History:  Family History: Last updated: 05/08/2009 Father: living-75 years old Mother: living 12years old Siblings: 2 brothers that are healthy  daughter with elevated testosterone  Social History: Last updated: 06/15/2007 Married Never Smoked  Risk Factors: Smoking Status: never (03/23/2010) Passive Smoke Exposure: no (03/23/2010)  Past medical, surgical, family and social histories (including risk factors) reviewed, and no changes noted (except as noted below).  Past Medical History: Reviewed history from 11/28/2007 and no changes required. Allergic rhinitis Depression meniscal tear  Past  Surgical History: Reviewed history from 08/03/2007 and no changes required. arthroscopy for knee  Family History: Reviewed history from 05/08/2009 and no changes required. Father: living-18 years old Mother: living 72years old Siblings: 2 brothers that are healthy  daughter with elevated testosterone  Social History: Reviewed history from 06/15/2007 and no changes required. Married Never Smoked  Review of Systems  The patient denies anorexia, fever, weight loss, weight gain, vision loss, decreased hearing, hoarseness, chest pain, syncope, dyspnea on exertion, peripheral edema, prolonged cough, headaches, hemoptysis, abdominal pain, melena, hematochezia, severe indigestion/heartburn, hematuria, incontinence, genital sores, muscle weakness, suspicious skin lesions, transient blindness, difficulty walking, depression, unusual weight change, abnormal bleeding, enlarged lymph nodes, angioedema, and breast masses.    Physical Exam  General:  Well-developed, well-nourished, normal body habitus; no deformities, normal grooming.   Head:  normocephalic and atraumatic.   Eyes:  pupils equal and pupils round.   Ears:  R ear normal and L ear normal.   Nose:  no external deformity and no nasal discharge.   Mouth:  good dentition and pharynx pink and moist.   Neck:  No deformities, masses, or tenderness noted. Chest Wall:  No deformities, masses, or tenderness noted. Lungs:  Normal respiratory effort, chest expands symmetrically. Lungs are clear to auscultation, no crackles or wheezes. Heart:  Normal rate and regular rhythm. S1 and S2 normal without gallop, murmur, click, rub or other extra sounds. Abdomen:  Bowel sounds positive,abdomen soft and non-tender without masses, organomegaly or hernias noted. Msk:  normal ROM and no joint tenderness.   Pulses:  R and L carotid,radial,femoral,dorsalis pedis and posterior tibial pulses are full and equal bilaterally Extremities:  No clubbing, cyanosis,  edema, or deformity noted with normal full range of motion of all joints.   Neurologic:  No cranial nerve deficits noted. Station and gait are normal. Plantar reflexes are down-going bilaterally. DTRs are symmetrical throughout. Sensory, motor and coordinative functions appear intact.   Impression & Recommendations:  Problem # 1:  TRANSAMINASES, SERUM, ELEVATED (ICD-790.4) Assessment Improved resolved  Problem # 2:  HYPERCHOLESTEROLEMIA, BORDERLINE (ICD-272.4) Assessment: New  Labs Reviewed: SGOT: 19 (03/10/2010)   SGPT: 18 (03/10/2010)  Lipid Goals: Chol Goal: 200 (03/23/2010)   HDL Goal: 40 (03/23/2010)   LDL Goal: 160 (03/23/2010)   TG Goal: 150 (03/23/2010)  10 Yr Risk Heart Disease: 4 %   HDL:67.20 (03/10/2010), 53.9 (06/07/2007)  LDL:115 (06/07/2007)  Chol:236 (03/10/2010), 185 (06/07/2007)  Trig:99.0 (03/10/2010), 83 (06/07/2007)  Problem # 3:  WEIGHT GAIN (ICD-783.1) Assessment: Deteriorated weigth gain   the pristiq the GYN doubled the pristiq for menopasuall symptoms  Problem # 4:  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 1 once daily    Aspirin 81 Mg Tbec (Aspirin) ..... One by mouth daily  Headache diary reviewed.  Problem # 5:  ADD (ICD-314.00) Assessment: New  Problem # 6:  PHYSICAL EXAMINATION (ICD-V70.0) Assessment: Unchanged The pt was asked about all immunizations, health maint. services  that are appropriate to their age and was given guidance on diet exercize  and weight management  Mammogram: normal (08/01/2009) Pap smear: normal (08/01/2009) Colonoscopy: abnormal (03/04/2006) Td Booster: Td (04/05/2006)   Flu Vax: Fluvax 3+ (12/18/2009)   Chol: 236 (03/10/2010)   HDL: 67.20 (03/10/2010)   LDL: 115 (06/07/2007)   TG: 99.0 (03/10/2010) TSH: 2.14  (03/10/2010)   Next mammogram due:: 08/2010 (03/23/2010) Next Colonoscopy due:: 03/2011 (03/23/2010)  Discussed using sunscreen, use of alcohol, drug use, self breast exam, routine dental care, routine eye care, schedule for GYN exam, routine physical exam, seat belts, multiple vitamins, osteoporosis prevention, adequate calcium intake in diet, recommendations for immunizations, mammograms and Pap smears.  Discussed exercise and checking cholesterol.  Discussed gun safety, safe sex, and contraception.  Complete Medication List: 1)  Omnaris 50 Mcg/act Susp (Ciclesonide) .... Two spary to each nostril once a day 2)  Fluoxetine Hcl 40 Mg Caps (Fluoxetine hcl) .... One by mouth daily 3)  Diclofenac Sodium 50 Mg Tbec (Diclofenac sodium) .Marland Kitchen.. 1 once daily 4)  Krill Oil 1000 Mg Caps (Krill oil) .... One by mouth two times a day 5)  Vyvanse 50 Mg Caps (Lisdexamfetamine dimesylate) .... One by mouth daily 6)  Temazepam 30 Mg Caps (Temazepam) .... One by mouth day 7)  Aspirin 81 Mg Tbec (Aspirin) .... One by mouth daily  Lipid Assessment/Plan:      Based on NCEP/ATP III, the patient's risk factor category is "0-1 risk factors".  The patient's lipid goals are as follows: Total cholesterol goal is 200; LDL cholesterol goal is 160; HDL cholesterol goal is 40; Triglyceride goal is 150.  Her LDL cholesterol goal has been met.    Patient Instructions: 1)  Please schedule a follow-up appointment in 3 months. 2)  Hepatic Panel prior to visit, ICD-9:995.20 3)  Lipid Panel prior to visit, ICD-9:272.4 Prescriptions: VYVANSE 50 MG CAPS (LISDEXAMFETAMINE DIMESYLATE) one by mouth daily  #90 x 0   Entered and Authorized by:   [name] MD   Signed by:   [name] MD on 03/23/2010   Method used:   Print then Give to Patient   RxID:   [id number] VYVANSE 50 MG CAPS (LISDEXAMFETAMINE DIMESYLATE) one by mouth daily  #30 x 0   Entered and Authorized by:   [name] MD   Signed by:   [name]  MD on 03/23/2010   Method used:   Print then Give to Patient   RxID:   [id number] FLUOXETINE HCL 40 MG CAPS (FLUOXETINE HCL) one by mouth daily  #30 x 11   Entered and Authorized by:   [name] MD   Signed by:   [name] MD on 03/23/2010   Method used:   Electronically to        [id number]* (retail)       [address]       [place], [place]  [address]       Ph: [phone]       Fax: [phone]   RxID:   [id number]    Orders Added: 1)  Est. Patient 40-64 years [99396] 2)  Est. Patient Level IV [56433]     Preventive Care Screening  Colonoscopy:    Date:  03/04/2006    Next Due:  03/2011    Results:  abnormal   Mammogram:    Date:  08/01/2009    Next Due:  08/2010  Results:  normal   Pap Smear:    Date:  08/01/2009    Next Due:  08/2010    Results:  normal

## 2010-05-07 NOTE — Progress Notes (Signed)
Summary: please update medlist  Phone Note Call from Patient Call back at Work Phone [phone]   Caller: Patient---triage vm Summary of Call: please change on her medlist:   she is on Temazepam 15mg , not 30mg .   Initial call taken by: Warnell Forester,  March 24, 2010 10:12 AM    New/Updated Medications: TEMAZEPAM 15 MG CAPS (TEMAZEPAM) 1 at bedtime as needed sleepi

## 2010-06-23 ENCOUNTER — Other Ambulatory Visit (INDEPENDENT_AMBULATORY_CARE_PROVIDER_SITE_OTHER): Payer: BC Managed Care – PPO

## 2010-06-23 DIAGNOSIS — T887XXA Unspecified adverse effect of drug or medicament, initial encounter: Secondary | ICD-10-CM

## 2010-06-23 DIAGNOSIS — E785 Hyperlipidemia, unspecified: Secondary | ICD-10-CM

## 2010-06-23 LAB — HEPATIC FUNCTION PANEL
ALT: 15 U/L (ref 0–35)
AST: 18 U/L (ref 0–37)
Bilirubin, Direct: 0.2 mg/dL (ref 0.0–0.3)
Total Bilirubin: 0.8 mg/dL (ref 0.3–1.2)

## 2010-06-23 LAB — LIPID PANEL
Cholesterol: 193 mg/dL (ref 0–200)
LDL Cholesterol: 103 mg/dL — ABNORMAL HIGH (ref 0–99)
Total CHOL/HDL Ratio: 3
VLDL: 15.8 mg/dL (ref 0.0–40.0)

## 2010-06-24 ENCOUNTER — Other Ambulatory Visit: Payer: Self-pay | Admitting: *Deleted

## 2010-06-24 DIAGNOSIS — F909 Attention-deficit hyperactivity disorder, unspecified type: Secondary | ICD-10-CM

## 2010-06-24 MED ORDER — LISDEXAMFETAMINE DIMESYLATE 50 MG PO CAPS: 50.0000 mg | ORAL_CAPSULE | ORAL | Status: DC

## 2010-06-29 ENCOUNTER — Encounter: Payer: Self-pay | Admitting: Internal Medicine

## 2010-06-30 ENCOUNTER — Encounter: Payer: Self-pay | Admitting: Internal Medicine

## 2010-06-30 ENCOUNTER — Ambulatory Visit: Payer: BC Managed Care – PPO | Admitting: Internal Medicine

## 2010-06-30 VITALS — BP 144/90 | HR 76 | Temp 98.1°F | Resp 14 | Ht 65.0 in | Wt 170.0 lb

## 2010-06-30 DIAGNOSIS — Z0000 Encounter for general adult medical examination without abnormal findings: Secondary | ICD-10-CM

## 2010-08-18 NOTE — Assessment & Plan Note (Signed)
[facility]                            CARDIOLOGY OFFICE NOTE   Susan, Esparza                       MRN:          [id number]  DATE:08/23/2007                            DOB:          [birth year]    Susan Esparza returns for followup at the [facility] Cardiology Office on  Aug 23, 2007.  Susan Esparza is a 55 year old whom I saw back in early  April for evaluation of a DVT.  She underwent arthroscopic left knee  surgery and had a postoperative left calf DVT.  Her duplex scan showed a  small DVT in the peroneal vein of the left leg without the presence of  any proximal DVT.  She has been on Coumadin since February 21.  She has  had no further complaints.  She notices minor swelling of the left leg  compared to the right.  She denies chest pain, dyspnea or any other  problems.  She has been walking about three miles daily.  She has been  considering right knee surgery but has been able to walk a good  distance.  She underwent a left lower extremity venous duplex today that  showed no evidence of residual thrombus.  Of note, her gynecologist  checked a lupus anticoagulant which apparently was positive.  She  presents today for followup and further discussion of this.   MEDICATIONS:  1. Include Prozac 20 mg daily.  2. Coumadin as directed.   ALLERGIES:  PENICILLIN.   EXAM:  The patient is alert and oriented.  No acute distress.  Weight is 175, blood pressure 120/80, heart rate 64, respiratory rate  12.  HEENT:  Normal.  NECK:  Normal carotid upstrokes.  No bruits.  JVP normal.  LUNGS:  Clear bilaterally.  HEART:  Regular rate and rhythm without murmurs or gallops.  ABDOMEN:  Soft, nontender no organomegaly.  EXTREMITIES:  No clubbing, cyanosis or edema.  Peripheral pulses 2+ and  equal throughout.  SKIN:  Warm and dry without rash.   ASSESSMENT:  This is a 55 year old woman with a postoperative deep vein  thrombosis.  She is now 3 months.  I am  comfortable with her  discontinuing Coumadin at this point.  I am not sure what to make of the  positive lupus anticoagulant study.  She does not have a history of  multiple miscarriages or recurrent deep vein thromboses.  She has had no  arterial thrombotic events.  I think her single deep vein thrombosis is  explainable as it occurred postoperatively.  I do not know that there  would be a benefit from continuing Coumadin for the long-term.  I told  her the only other way I knew to get more information would be to  undergo a hematology evaluation and she would like to do this.  We will  make a referral and she will be evaluated to see if any other blood work  is needed and to get a final recommendation on whether she needs ongoing  Coumadin.   If she decides to have right knee surgery done, she clearly will need  very aggressive DVT prophylaxis in the perioperative period.     Veverly Fells. Excell Seltzer, MD  Electronically Signed    MDC/MedQ  DD: 08/23/2007  DT: 08/23/2007  Job #: [id number]   cc:   [name], MD

## 2010-08-21 NOTE — Letter (Signed)
July 12, 2007    Susan Glaze, MD  [address] [place], [place] [address]   RE:  Susan Esparza  MRN:  [id number]  /  DOB:  10-May-1955   Dear Susan Esparza,   It was my pleasure to see Susan Esparza an outpatient on March 30 for  evaluation of lower extremity deep vein thrombosis.   As you know, Susan Esparza is a delightful, 55 year old woman who underwent  arthroscopic medicine left knee surgery for a medial meniscus tear on  February 21. Within a few days of surgery, she developed left calf  swelling and pain.  Her clinical symptoms were suggestive of a DVT.  She  had a lower extremity venous duplex performed on February 25 that  demonstrated a small DVT in a peroneal vein of the left leg.  There was  no proximal DVT present. She was treated with Lovenox and Coumadin and  had rapid resolution of her leg symptoms.  She denies any residual  swelling.  She has no further pain in the calf.  She has no prior  history of DVT.  She has had no other arterial or venous thrombotic  events.   PERTINENT PAST MEDICAL HISTORY:  Depression, left knee medial meniscus  tear as detailed above, previous right knee injury and arthroscopic  surgery in the 1980s, osteoarthritis with upcoming right knee  replacement not yet scheduled.   MEDICATIONS:  1. Prozac 20 mg daily.  2. Coumadin as directed.  3. Claritin-D.   ALLERGIES:  PENICILLIN causes hives.   SOCIAL HISTORY:  The patient is married with two children. 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  No  history of cigarettes or other tobacco use. She drinks alcohol rarely,  two cups of coffee daily and does regular exercise with walking,  elliptical training and light weights.   FAMILY HISTORY:  There is no history of DVT, stroke, coronary artery  disease or other vascular disease in the family.   REVIEW OF SYSTEMS:  A complete 12-point review of systems was performed.  Pertinent positives included  seasonal allergies, anemia and history of  one miscarriage. All other systems were reviewed and are negative except  as detailed above.   PHYSICAL EXAMINATION:  Patient is alert and oriented.  She is in no  distress.  Weight is 175, blood pressure 128/90, heart rate 68, respiratory rate  16.  HEENT:  Normal.  NECK:  Normal carotid upstrokes without bruits.  Jugular venous pressure  is normal.  No thyromegaly or thyroid nodules.  LUNGS:  Clear to auscultation bilaterally.  HEART:  The apex is discreet and nondisplaced. The heart is regular rate  and rhythm without murmurs or gallops.  ABDOMEN:  Soft, nontender, no organomegaly.  No abdominal bruits, no  masses, no rebound or guarding.  BACK:  No CVA tenderness.  EXTREMITIES:  There is no clubbing, cyanosis or edema.  Peripheral  pulses 2+ and equal throughout.  SKIN:  Warm and dry without rash.  LYMPHATICS:  No adenopathy.  NEUROLOGIC:  Cranial nerves II-XII are intact.  Strength 5/5 and equal.   Vascular ultrasound study dated March 3 showed a small DVT in a  posterior proximal calf vein.   ASSESSMENT:  This is a 55 year old woman with a below-knee DVT in the  setting of a reversible cause (postoperative).   Susan Esparza appears to have no significant sequela from her DVT.  She is  at low  risk for long-term problems such as postphlebitic syndrome since  she has had complete resolution of her edema and has responded well to  therapy.  While below DVT is not as risky for thromboembolism as a major  proximal DVT, there clearly is thromboembolic risk and possibility of  pulmonary embolus with this problem. The current recommendation is for 3  months of  Coumadin.  I looked at the literature for shorter treatment  lengths and there have been studies for below-knee DVT evaluating 4-6  weeks of therapy.  There are slightly more recurrent thromboembolic  events with this duration of therapy.  Therefore, I would recommend the  full 3  months but certainly she does not need a longer treatment course.  Will repeat a duplex ultrasound in approximately 2 months and I suspect  she will have full resolution of her DVT at that point.  She informed me  today that she needs a right total knee replacement and would like to  have it done this calendar year.  She is going to be at high risk of a  recurrent event at that point and that will need to be factored into her  perioperative anticoagulation for DVT prevention.  Since this will be  part of standard protocol for DVT prevention in a postoperative patient  for total joint replacement, we should not need to alter her therapy too  much.   I will plan on seeing Susan Esparza back at the time of her ultrasound in 2  months for a final recommendation under anticoagulation.   Susan Esparza, thanks again for asking me to see this nice lady.  Please feel  free to contact me at any time with questions.    Sincerely,      Susan Esparza. Susan Seltzer, MD  Electronically Signed    MDC/MedQ  DD: 07/12/2007  DT: 07/13/2007  Job #: [id number]   CC:    Susan Esparza. Susan Esparza, M.D.

## 2010-09-29 ENCOUNTER — Telehealth: Payer: Self-pay | Admitting: Internal Medicine

## 2010-09-29 MED ORDER — LISDEXAMFETAMINE DIMESYLATE 50 MG PO CAPS: 50.0000 mg | ORAL_CAPSULE | ORAL | Status: DC

## 2010-09-29 NOTE — Telephone Encounter (Signed)
Refill Vyvanse  

## 2010-10-19 ENCOUNTER — Telehealth: Payer: Self-pay | Admitting: Internal Medicine

## 2010-10-19 NOTE — Telephone Encounter (Signed)
Please advise 

## 2010-10-19 NOTE — Telephone Encounter (Signed)
Patients mother was following up with a call she had with the triage nurse about a month ago in regards to a referral for her daughter to go to an endocrinoloigist for her P.C.O.S systems.  Please contact.

## 2010-10-20 NOTE — Telephone Encounter (Signed)
Note was documented under wrong chart.

## 2011-01-05 ENCOUNTER — Other Ambulatory Visit: Payer: Self-pay | Admitting: *Deleted

## 2011-01-05 DIAGNOSIS — Z0000 Encounter for general adult medical examination without abnormal findings: Secondary | ICD-10-CM

## 2011-01-05 MED ORDER — LISDEXAMFETAMINE DIMESYLATE 50 MG PO CAPS: 50.0000 mg | ORAL_CAPSULE | ORAL | Status: DC

## 2011-01-11 ENCOUNTER — Ambulatory Visit (INDEPENDENT_AMBULATORY_CARE_PROVIDER_SITE_OTHER): Payer: BC Managed Care – PPO | Admitting: *Deleted

## 2011-01-11 DIAGNOSIS — Z23 Encounter for immunization: Secondary | ICD-10-CM

## 2011-01-13 ENCOUNTER — Encounter: Payer: Self-pay | Admitting: Gastroenterology

## 2011-01-19 ENCOUNTER — Ambulatory Visit (AMBULATORY_SURGERY_CENTER): Payer: BC Managed Care – PPO | Admitting: *Deleted

## 2011-01-19 ENCOUNTER — Encounter: Payer: Self-pay | Admitting: Gastroenterology

## 2011-01-19 DIAGNOSIS — R197 Diarrhea, unspecified: Secondary | ICD-10-CM

## 2011-01-19 DIAGNOSIS — Z86010 Personal history of colonic polyps: Secondary | ICD-10-CM

## 2011-01-19 DIAGNOSIS — Z1211 Encounter for screening for malignant neoplasm of colon: Secondary | ICD-10-CM

## 2011-01-19 NOTE — Progress Notes (Signed)
At her previsit, pt states that she has been having some GI problems recently.  For the past 6 weeks, she has had diarrhea.  She states that usually she has to take Psyllium to help her bowels move, but not since she was on a camping trip 6 weeks ago.  She has also had some abdominal cramping and pain.  She recently had a UTI that test positive for E Coli within the last 3 weeks.  Colonoscopy for 01-25-11 cancelled and office appointment made to see Dr. Russella Dar on 02-10-11.

## 2011-01-25 ENCOUNTER — Other Ambulatory Visit: Payer: BC Managed Care – PPO | Admitting: Gastroenterology

## 2011-02-09 ENCOUNTER — Other Ambulatory Visit: Payer: BC Managed Care – PPO | Admitting: Gastroenterology

## 2011-02-10 ENCOUNTER — Ambulatory Visit: Payer: BC Managed Care – PPO | Admitting: Gastroenterology

## 2011-02-10 NOTE — Progress Notes (Signed)
Addended by: Maple Hudson on: 02/10/2011 03:21 PM   Modules accepted: Level of Service

## 2011-03-24 ENCOUNTER — Other Ambulatory Visit: Payer: Self-pay | Admitting: *Deleted

## 2011-03-24 MED ORDER — FLUOXETINE HCL 40 MG PO CAPS: 40.0000 mg | ORAL_CAPSULE | Freq: Every day | ORAL | Status: DC

## 2011-04-07 ENCOUNTER — Telehealth: Payer: Self-pay | Admitting: Internal Medicine

## 2011-04-07 NOTE — Telephone Encounter (Signed)
Pt has been notified that Vit D lvl has been added to cpx labs on 04/09/11, as noted.

## 2011-04-07 NOTE — Telephone Encounter (Signed)
Pt called and said that she is coming in for labs on 04/08/11 and would like to know if she can get Vit D lvl added to her labs?

## 2011-04-07 NOTE — Telephone Encounter (Signed)
Put in computer For lab appointment

## 2011-04-07 NOTE — Telephone Encounter (Signed)
Her appointment for labs are 1-4 --do you  Need to tell her that since youo have 1-3 listed as appointment?

## 2011-04-09 ENCOUNTER — Other Ambulatory Visit (INDEPENDENT_AMBULATORY_CARE_PROVIDER_SITE_OTHER): Payer: BC Managed Care – PPO

## 2011-04-09 DIAGNOSIS — Z0000 Encounter for general adult medical examination without abnormal findings: Secondary | ICD-10-CM

## 2011-04-09 LAB — CBC WITH DIFFERENTIAL/PLATELET
Basophils Absolute: 0 10*3/uL (ref 0.0–0.1)
Eosinophils Absolute: 0.2 10*3/uL (ref 0.0–0.7)
Eosinophils Relative: 3.6 % (ref 0.0–5.0)
MCV: 90.2 fl (ref 78.0–100.0)
Monocytes Absolute: 0.5 10*3/uL (ref 0.1–1.0)
Neutrophils Relative %: 55.2 % (ref 43.0–77.0)
Platelets: 214 10*3/uL (ref 150.0–400.0)
RDW: 14.5 % (ref 11.5–14.6)
WBC: 4.8 10*3/uL (ref 4.5–10.5)

## 2011-04-09 LAB — POCT URINALYSIS DIPSTICK
Ketones, UA: NEGATIVE
Leukocytes, UA: NEGATIVE
Nitrite, UA: NEGATIVE
Urobilinogen, UA: 0.2

## 2011-04-09 LAB — HEPATIC FUNCTION PANEL
ALT: 17 U/L (ref 0–35)
Total Bilirubin: 0.6 mg/dL (ref 0.3–1.2)

## 2011-04-09 LAB — BASIC METABOLIC PANEL
CO2: 30 mEq/L (ref 19–32)
Calcium: 9.4 mg/dL (ref 8.4–10.5)
Creatinine, Ser: 0.7 mg/dL (ref 0.4–1.2)

## 2011-04-09 LAB — LIPID PANEL
Cholesterol: 208 mg/dL — ABNORMAL HIGH (ref 0–200)
Total CHOL/HDL Ratio: 2
Triglycerides: 71 mg/dL (ref 0.0–149.0)
VLDL: 14.2 mg/dL (ref 0.0–40.0)

## 2011-04-10 LAB — VITAMIN D 25 HYDROXY (VIT D DEFICIENCY, FRACTURES): Vit D, 25-Hydroxy: 23 ng/mL — ABNORMAL LOW (ref 30–89)

## 2011-04-16 ENCOUNTER — Encounter: Payer: Self-pay | Admitting: Internal Medicine

## 2011-04-16 ENCOUNTER — Ambulatory Visit (INDEPENDENT_AMBULATORY_CARE_PROVIDER_SITE_OTHER): Payer: BC Managed Care – PPO | Admitting: Internal Medicine

## 2011-04-16 ENCOUNTER — Telehealth: Payer: Self-pay | Admitting: Internal Medicine

## 2011-04-16 DIAGNOSIS — Z0000 Encounter for general adult medical examination without abnormal findings: Secondary | ICD-10-CM

## 2011-04-16 MED ORDER — LISDEXAMFETAMINE DIMESYLATE 50 MG PO CAPS: 50.0000 mg | ORAL_CAPSULE | ORAL | Status: DC

## 2011-04-16 NOTE — Progress Notes (Signed)
Subjective:    Patient ID: Susan Esparza, female    DOB: 1955-05-13, 56 y.o.   MRN: [id number]  HPI  cpx Susan Esparza to see the the GI for colon and the probiotic has helped significantly. The GI doctor suggested stopping the  NSAIDs   Review of Systems  Constitutional: Negative for activity change, appetite change and fatigue.  HENT: Negative for ear pain, congestion, neck pain, postnasal drip and sinus pressure.   Eyes: Negative for redness and visual disturbance.  Respiratory: Negative for cough, shortness of breath and wheezing.   Gastrointestinal: Negative for abdominal pain and abdominal distention.  Genitourinary: Negative for dysuria, frequency and menstrual problem.  Musculoskeletal: Negative for myalgias, joint swelling and arthralgias.  Skin: Negative for rash and wound.  Neurological: Negative for dizziness, weakness and headaches.  Hematological: Negative for adenopathy. Does not bruise/bleed easily.  Psychiatric/Behavioral: Negative for sleep disturbance and decreased concentration.   Past Medical History  Diagnosis Date  . Allergic rhinitis   . Depression   . Acute meniscal tear of knee   . Ocular rosacea     History   Social History  . Marital Status: Married    Spouse Name: N/A    Number of Children: N/A  . Years of Education: N/A   Occupational History  . Not on file.   Social History Main Topics  . Smoking status: Never Smoker   . Smokeless tobacco: Not on file  . Alcohol Use: Yes  . Drug Use: No  . Sexually Active: Not on file   Other Topics Concern  . Not on file   Social History Narrative  . No narrative on file    Past Surgical History  Procedure Date  . Arthroscopy for knee     No family history on file.  Allergies  Allergen Reactions  . Bupropion Hcl     REACTION: hives    Current Outpatient Prescriptions on File Prior to Visit  Medication Sig Dispense Refill  . aspirin 81 MG tablet Take 81 mg by mouth daily.        .  ciclesonide (OMNARIS) 50 MCG/ACT nasal spray 2 sprays by Each Nare route daily.        Marland Kitchen FLUoxetine (PROZAC) 40 MG capsule Take 40 mg by mouth daily.        Marland Kitchen lisdexamfetamine (VYVANSE) 50 MG capsule Take 1 capsule (50 mg total) by mouth every morning.  30 capsule  0  . temazepam (RESTORIL) 15 MG capsule Take 15 mg by mouth at bedtime as needed.          BP 140/90  Pulse 72  Temp 98.3 F (36.8 C)  Resp 16  Ht 5\' 5"  (1.651 m)  Wt 168 lb (76.204 kg)  BMI 27.96 kg/m2        Objective:   Physical Exam  Nursing note and vitals reviewed. Constitutional: She is oriented to person, place, and time. She appears well-developed and well-nourished. No distress.  HENT:  Head: Normocephalic and atraumatic.  Right Ear: External ear normal.  Left Ear: External ear normal.  Nose: Nose normal.  Mouth/Throat: Oropharynx is clear and moist.  Eyes: Conjunctivae and EOM are normal. Pupils are equal, round, and reactive to light.  Neck: Normal range of motion. Neck supple. No JVD present. No tracheal deviation present. No thyromegaly present.  Cardiovascular: Normal rate, regular rhythm, normal heart sounds and intact distal pulses.   No murmur heard. Pulmonary/Chest: Effort normal and breath sounds normal. She has no wheezes.  She exhibits no tenderness.  Abdominal: Soft. Bowel sounds are normal.  Musculoskeletal: Normal range of motion. She exhibits no edema and no tenderness.  Lymphadenopathy:    She has no cervical adenopathy.  Neurological: She is alert and oriented to person, place, and time. She has normal reflexes. No cranial nerve deficit.  Skin: Skin is warm and dry. She is not diaphoretic.  Psychiatric: She has a normal mood and affect. Her behavior is normal.          Assessment & Plan:   This is a routine physical examination for this healthy  Female. Reviewed all health maintenance protocols including mammography colonoscopy bone density and reviewed appropriate screening labs.  Her immunization history was reviewed as well as her current medications and allergies refills of her chronic medications were given and the plan for yearly health maintenance was discussed all orders and referrals were made as appropriate.   The patient brings with her a strip from her colonoscopy in which she is in a regular bigeminy.  There is a family history of atrial fibrillation and she states that both her brothers have discussed that they feel occasional irregular heartbeat.  There is no history of sudden cardiac death to suggest HOCM But if she experiences upon or increase in frequency of the bigeminy we would initiate a workup including an echocardiogram and Holter monitor.

## 2011-04-16 NOTE — Telephone Encounter (Signed)
Per dr Lovell Sheehan- take 2000 u vitamind daily--Left message on machine

## 2011-04-16 NOTE — Patient Instructions (Signed)
Resume the diclofenac slowly and report and effects  monitor the bigeminy and if there is an increase in frequency or any "runs" contact out office ASAP

## 2011-04-16 NOTE — Telephone Encounter (Signed)
Was seen this morning and she did not get her rx for Vitamin D. Please call new [facility]. Thanks.

## 2011-06-11 ENCOUNTER — Telehealth: Payer: Self-pay | Admitting: Internal Medicine

## 2011-06-11 MED ORDER — DICLOFENAC POTASSIUM 50 MG PO TABS: 50.0000 mg | ORAL_TABLET | Freq: Two times a day (BID) | ORAL | Status: AC

## 2011-06-11 NOTE — Telephone Encounter (Signed)
Pt called req script for Diclofenac DR 50 mg tab 1 tab every 12 hrs after meals. Pt said that Dr Kaylyn Layer, usually prescribes this med, but pt is req that Dr Lovell Sheehan start prescribing since pt doesn't see Dr Kaylyn Layer that often. Anti-inflamatory for plantar fasciitis and arthiritus. [facility] [address]

## 2011-06-11 NOTE — Telephone Encounter (Signed)
Left message on machine Med called to pharmacy

## 2011-07-09 ENCOUNTER — Ambulatory Visit (INDEPENDENT_AMBULATORY_CARE_PROVIDER_SITE_OTHER): Payer: BC Managed Care – PPO | Admitting: Internal Medicine

## 2011-07-09 DIAGNOSIS — Z0000 Encounter for general adult medical examination without abnormal findings: Secondary | ICD-10-CM

## 2011-08-12 ENCOUNTER — Encounter: Payer: Self-pay | Admitting: Internal Medicine

## 2011-08-12 ENCOUNTER — Ambulatory Visit (INDEPENDENT_AMBULATORY_CARE_PROVIDER_SITE_OTHER): Payer: BC Managed Care – PPO | Admitting: Internal Medicine

## 2011-08-12 VITALS — BP 136/80 | HR 72 | Temp 98.0°F | Resp 16 | Ht 63.0 in | Wt 174.0 lb

## 2011-08-12 DIAGNOSIS — T887XXA Unspecified adverse effect of drug or medicament, initial encounter: Secondary | ICD-10-CM

## 2011-08-12 DIAGNOSIS — R030 Elevated blood-pressure reading, without diagnosis of hypertension: Secondary | ICD-10-CM

## 2011-08-12 DIAGNOSIS — R635 Abnormal weight gain: Secondary | ICD-10-CM

## 2011-08-12 NOTE — Progress Notes (Signed)
Subjective:    Patient ID: Susan Esparza, female    DOB: [birth year], 56 y.o.   MRN: [id number]  HPI persistent hot flashes ( vasomotor) Blood pressure at podiatrist, denist and GYN has been high... Stopped the vvyanse and the blood pressure has improved Diet has been poor.  Review of Systems  Constitutional: Negative for activity change, appetite change and fatigue.  HENT: Negative for ear pain, congestion, neck pain, postnasal drip and sinus pressure.   Eyes: Negative for redness and visual disturbance.  Respiratory: Negative for cough, shortness of breath and wheezing.   Gastrointestinal: Negative for abdominal pain and abdominal distention.  Genitourinary: Negative for dysuria, frequency and menstrual problem.  Musculoskeletal: Negative for myalgias, joint swelling and arthralgias.  Skin: Negative for rash and wound.  Neurological: Negative for dizziness, weakness and headaches.  Hematological: Negative for adenopathy. Does not bruise/bleed easily.  Psychiatric/Behavioral: Negative for sleep disturbance and decreased concentration.   Past Medical History  Diagnosis Date  . Allergic rhinitis   . Depression   . Acute meniscal tear of knee   . Ocular rosacea     History   Social History  . Marital Status: Married    Spouse Name: N/A    Number of Children: N/A  . Years of Education: N/A   Occupational History  . Not on file.   Social History Main Topics  . Smoking status: Never Smoker   . Smokeless tobacco: Not on file  . Alcohol Use: Yes  . Drug Use: No  . Sexually Active: Not on file   Other Topics Concern  . Not on file   Social History Narrative  . No narrative on file    Past Surgical History  Procedure Date  . Arthroscopy for knee     No family history on file.  Allergies  Allergen Reactions  . Bupropion Hcl     REACTION: hives    Current Outpatient Prescriptions on File Prior to Visit  Medication Sig Dispense Refill  . mometasone  (NASONEX) 50 MCG/ACT nasal spray Place 2 sprays into the nose daily.      Marland Kitchen aspirin 81 MG tablet Take 81 mg by mouth daily.        . diclofenac (CATAFLAM) 50 MG tablet Take 1 tablet (50 mg total) by mouth 2 (two) times daily.  60 tablet  3  . FLUoxetine (PROZAC) 40 MG capsule Take 40 mg by mouth daily.        Marland Kitchen lisdexamfetamine (VYVANSE) 50 MG capsule Take 1 capsule (50 mg total) by mouth every morning.  30 capsule  0  . DISCONTD: lisdexamfetamine (VYVANSE) 50 MG capsule Take 1 capsule (50 mg total) by mouth every morning.  30 capsule  0    BP 136/80  Pulse 72  Temp 98 F (36.7 C)  Resp 16  Ht 5\' 3"  (1.6 m)  Wt 174 lb (78.926 kg)  BMI 30.82 kg/m2        Objective:   Physical Exam  Nursing note reviewed. Constitutional: She is oriented to person, place, and time. She appears well-developed and well-nourished. No distress.  HENT:  Head: Normocephalic and atraumatic.  Right Ear: External ear normal.  Left Ear: External ear normal.  Nose: Nose normal.  Mouth/Throat: Oropharynx is clear and moist.  Eyes: Conjunctivae and EOM are normal. Pupils are equal, round, and reactive to light.  Neck: Normal range of motion. Neck supple. No JVD present. No tracheal deviation present. No thyromegaly present.  Cardiovascular: Normal rate, regular  rhythm, normal heart sounds and intact distal pulses.   No murmur heard. Pulmonary/Chest: Effort normal and breath sounds normal. She has no wheezes. She exhibits no tenderness.  Abdominal: Soft. Bowel sounds are normal.  Musculoskeletal: Normal range of motion. She exhibits no edema and no tenderness.  Lymphadenopathy:    She has no cervical adenopathy.  Neurological: She is alert and oriented to person, place, and time. She has normal reflexes. No cranial nerve deficit.  Skin: Skin is warm and dry. She is not diaphoretic.  Psychiatric: She has a normal mood and affect. Her behavior is normal.          Assessment & Plan:  Allergies  stable Blood pressure reading increased due to the ADD medications Mood stable weight gain reviewed diet

## 2011-08-12 NOTE — Patient Instructions (Signed)
Practical paleo 

## 2011-10-01 ENCOUNTER — Other Ambulatory Visit: Payer: Self-pay | Admitting: *Deleted

## 2011-10-01 MED ORDER — PHENTERMINE HCL 37.5 MG PO CAPS: 37.5000 mg | ORAL_CAPSULE | ORAL | Status: DC

## 2011-11-16 ENCOUNTER — Ambulatory Visit: Payer: BC Managed Care – PPO | Admitting: Internal Medicine

## 2012-01-21 ENCOUNTER — Ambulatory Visit: Payer: Self-pay | Admitting: Internal Medicine

## 2012-02-14 ENCOUNTER — Encounter: Payer: Self-pay | Admitting: Internal Medicine

## 2012-02-14 ENCOUNTER — Ambulatory Visit (INDEPENDENT_AMBULATORY_CARE_PROVIDER_SITE_OTHER): Payer: BC Managed Care – PPO | Admitting: Internal Medicine

## 2012-02-14 VITALS — BP 130/80 | HR 76 | Temp 98.3°F | Resp 16 | Ht 63.0 in | Wt 180.0 lb

## 2012-02-14 DIAGNOSIS — Z23 Encounter for immunization: Secondary | ICD-10-CM

## 2012-02-14 DIAGNOSIS — E669 Obesity, unspecified: Secondary | ICD-10-CM

## 2012-02-14 DIAGNOSIS — M19049 Primary osteoarthritis, unspecified hand: Secondary | ICD-10-CM

## 2012-02-14 NOTE — Progress Notes (Signed)
Subjective:    Patient ID: Susan Esparza, female    DOB: January 22, 1956, 56 y.o.   MRN: [id number]  HPI Increased arthritic pain in joints   Review of Systems  Constitutional: Negative for activity change, appetite change and fatigue.  HENT: Negative for ear pain, congestion, neck pain, postnasal drip and sinus pressure.   Eyes: Negative for redness and visual disturbance.  Respiratory: Negative for cough, shortness of breath and wheezing.   Gastrointestinal: Negative for abdominal pain and abdominal distention.  Genitourinary: Negative for dysuria, frequency and menstrual problem.  Musculoskeletal: Negative for myalgias, joint swelling and arthralgias.  Skin: Negative for rash and wound.  Neurological: Negative for dizziness, weakness and headaches.  Hematological: Negative for adenopathy. Does not bruise/bleed easily.  Psychiatric/Behavioral: Negative for sleep disturbance and decreased concentration.   Past Medical History  Diagnosis Date  . Allergic rhinitis   . Depression   . Acute meniscal tear of knee   . Ocular rosacea     History   Social History  . Marital Status: Married    Spouse Name: N/A    Number of Children: N/A  . Years of Education: N/A   Occupational History  . Not on file.   Social History Main Topics  . Smoking status: Never Smoker   . Smokeless tobacco: Not on file  . Alcohol Use: Yes  . Drug Use: No  . Sexually Active: Not on file   Other Topics Concern  . Not on file   Social History Narrative  . No narrative on file    Past Surgical History  Procedure Date  . Arthroscopy for knee     No family history on file.  Allergies  Allergen Reactions  . Bupropion Hcl     REACTION: hives    Current Outpatient Prescriptions on File Prior to Visit  Medication Sig Dispense Refill  . diclofenac (CATAFLAM) 50 MG tablet Take 1 tablet (50 mg total) by mouth 2 (two) times daily.  60 tablet  3  . FLUoxetine (PROZAC) 40 MG capsule Take 40 mg by  mouth daily.        . mometasone (NASONEX) 50 MCG/ACT nasal spray Place 2 sprays into the nose daily.        BP 130/80  Pulse 76  Temp 98.3 F (36.8 C)  Resp 16  Ht 5\' 3"  (1.6 m)  Wt 180 lb (81.647 kg)  BMI 31.89 kg/m2       Objective:   Physical Exam  Nursing note and vitals reviewed. Constitutional: She is oriented to person, place, and time. She appears well-developed and well-nourished. No distress.  HENT:  Head: Normocephalic and atraumatic.  Right Ear: External ear normal.  Left Ear: External ear normal.  Nose: Nose normal.  Mouth/Throat: Oropharynx is clear and moist.  Eyes: Conjunctivae normal and EOM are normal. Pupils are equal, round, and reactive to light.  Neck: Normal range of motion. Neck supple. No JVD present. No tracheal deviation present. No thyromegaly present.  Cardiovascular: Normal rate, regular rhythm, normal heart sounds and intact distal pulses.   No murmur heard. Pulmonary/Chest: Effort normal and breath sounds normal. She has no wheezes. She exhibits no tenderness.  Abdominal: Soft. Bowel sounds are normal.  Musculoskeletal: Normal range of motion. She exhibits no edema and no tenderness.  Lymphadenopathy:    She has no cervical adenopathy.  Neurological: She is alert and oriented to person, place, and time. She has normal reflexes. No cranial nerve deficit.  Skin: Skin is warm  and dry. She is not diaphoretic.  Psychiatric: She has a normal mood and affect. Her behavior is normal.          Assessment & Plan:  Severe OA in hand Screen for RA Discussed NSAID change consider referral to rhematology

## 2012-02-14 NOTE — Patient Instructions (Signed)
Practical paleo 

## 2012-03-09 ENCOUNTER — Other Ambulatory Visit: Payer: Self-pay | Admitting: Internal Medicine

## 2012-04-07 ENCOUNTER — Other Ambulatory Visit: Payer: Self-pay | Admitting: *Deleted

## 2012-04-07 MED ORDER — PHENTERMINE HCL 37.5 MG PO CAPS: 37.5000 mg | ORAL_CAPSULE | ORAL | Status: DC

## 2012-04-28 ENCOUNTER — Ambulatory Visit (INDEPENDENT_AMBULATORY_CARE_PROVIDER_SITE_OTHER): Payer: BC Managed Care – PPO | Admitting: Internal Medicine

## 2012-04-28 ENCOUNTER — Telehealth: Payer: Self-pay | Admitting: Internal Medicine

## 2012-04-28 ENCOUNTER — Encounter (INDEPENDENT_AMBULATORY_CARE_PROVIDER_SITE_OTHER): Payer: Self-pay

## 2012-04-28 ENCOUNTER — Encounter: Payer: Self-pay | Admitting: Internal Medicine

## 2012-04-28 VITALS — BP 132/84 | Temp 97.9°F | Wt 185.0 lb

## 2012-04-28 DIAGNOSIS — M79609 Pain in unspecified limb: Secondary | ICD-10-CM

## 2012-04-28 DIAGNOSIS — I499 Cardiac arrhythmia, unspecified: Secondary | ICD-10-CM

## 2012-04-28 DIAGNOSIS — Z86718 Personal history of other venous thrombosis and embolism: Secondary | ICD-10-CM

## 2012-04-28 DIAGNOSIS — M79604 Pain in right leg: Secondary | ICD-10-CM

## 2012-04-28 DIAGNOSIS — R0989 Other specified symptoms and signs involving the circulatory and respiratory systems: Secondary | ICD-10-CM

## 2012-04-28 NOTE — Telephone Encounter (Signed)
Patient Information:  Caller Name: Susan Esparza  Phone: [phone]  Patient: Susan, Esparza  Gender: Female  DOB: [birth year]  Age: 57 Years  PCP: Darryll Capers (Adults only)  Office Follow Up:  Does the office need to follow up with this patient?: No  Instructions For The Office: N/A   Symptoms  Reason For Call & Symptoms: Sharp intermittent pain behind her right knee for several days.  No redness or swelling.  She fell 2 weeks prior to getting the first pain ~ 12/8.  She didn't have recurrent pain for 2 weeks and then it started reoccuring again.  There is no significant event that causes the pain, it occurs with rest, exercise.  "Feels like a hot poker stabbing her in the back of the knee" but only last about 5-8 seconds.  Does have some numbness and tingling in the right foot, lower leg.  Reviewed Health History In EMR: Yes  Reviewed Medications In EMR: Yes  Reviewed Allergies In EMR: Yes  Reviewed Surgeries / Procedures: Yes  Date of Onset of Symptoms: 03/12/2012  Guideline(s) Used:  Leg Pain  Disposition Per Guideline:   Go to ED Now (or to Office with PCP Approval)  Reason For Disposition Reached:   History of prior "blood clot" in leg or lungs (i.e., deep vein thrombosis, pulmonary embolism)  Advice Given:  N/A  RN Overrode Recommendation:  Make Appointment  see in office  Appointment Scheduled:  04/28/2012 13:15:00 Appointment Scheduled Provider:  Artist Pais, Doe-Hyun Molly Maduro) (Adults only)

## 2012-04-28 NOTE — Assessment & Plan Note (Signed)
57 year old white female complains of intermittent sharp deep aching pain behind her right knee. Symptoms started after she fell onto her right knee in early December. Unclear whether symptoms secondary to DVT. She is at higher risk since she has history of DVT in her left leg after arthroscopic surgery. If Doppler is negative we discussed obtaining MRI of knee.   Her symptoms not consistent with bakers cyst.

## 2012-04-28 NOTE — Patient Instructions (Addendum)
Please follow up with Dr. Lovell Sheehan within 2 weeks

## 2012-04-28 NOTE — Progress Notes (Signed)
  Subjective:    Patient ID: Susan Esparza, female    DOB: [birth year], 57 y.o.   MRN: [id number]  HPI  57 year old white female complains of intermittent pain behind her right knee. They have been getting worse over the last one month. She thought symptoms related to a fall she suffered in early December. Patient describes a sharp, burning/deep pain. She tried performing stretching exercises which did not help. Patient also complains of intermittent numbness down her right foot. No specific trigger to her symptoms.  Patient has a history of left DVT after arthroscopic knee surgery in 2009. Patient not sure whether she tested positive for lupus anticoagulant.   Review of Systems Negative for chest pain or shortness of breath  Past Medical History  Diagnosis Date  . DVT (deep vein thrombosis) in pregnancy 2009    left leg after knee Arthroscopy    History   Social History  . Marital Status: Unknown    Spouse Name: N/A    Number of Children: N/A  . Years of Education: N/A   Occupational History  . Not on file.   Social History Main Topics  . Smoking status: Not on file  . Smokeless tobacco: Not on file  . Alcohol Use: Not on file  . Drug Use: Not on file  . Sexually Active: Not on file   Other Topics Concern  . Not on file   Social History Narrative  . No narrative on file    Past Surgical History  Procedure Date  . Knee arthroscopy 2009    No family history on file.  Allergies not on file  Current Outpatient Prescriptions on File Prior to Visit  Medication Sig Dispense Refill  . FLUoxetine (PROZAC) 40 MG capsule TAKE ONE CAPSULE BY MOUTH ONE TIME DAILY  30 capsule  5  . phentermine 37.5 MG capsule Take 1 capsule (37.5 mg total) by mouth every morning.  30 capsule  2    BP 132/84  Temp 97.9 F (36.6 C) (Oral)  Wt 185 lb (83.915 kg)         Objective:   Physical Exam  Constitutional: She is oriented to person, place, and time. She appears  well-developed and well-nourished.  HENT:  Head: Normocephalic and atraumatic.  Cardiovascular: Normal rate, regular rhythm, normal heart sounds and intact distal pulses.   Pulmonary/Chest: Effort normal and breath sounds normal. She has no wheezes.  Musculoskeletal: She exhibits no edema.       No tenderness of right popliteal fossa, mild tenderness of posterior right thigh, no redness or swelling Right knee joint is stable  Neurological: She is alert and oriented to person, place, and time. No cranial nerve deficit.       Normal sensation of temperature and vibration in her lower extremities          Assessment & Plan:

## 2012-05-01 ENCOUNTER — Encounter: Payer: Self-pay | Admitting: Internal Medicine

## 2012-05-12 ENCOUNTER — Ambulatory Visit (INDEPENDENT_AMBULATORY_CARE_PROVIDER_SITE_OTHER): Payer: BC Managed Care – PPO | Admitting: Internal Medicine

## 2012-05-12 VITALS — BP 142/96 | Temp 98.7°F | Wt 179.0 lb

## 2012-05-12 DIAGNOSIS — M79609 Pain in unspecified limb: Secondary | ICD-10-CM

## 2012-05-12 DIAGNOSIS — M79604 Pain in right leg: Secondary | ICD-10-CM

## 2012-05-12 MED ORDER — CELECOXIB 200 MG PO CAPS: 200.0000 mg | ORAL_CAPSULE | Freq: Every day | ORAL | Status: DC

## 2012-05-12 NOTE — Progress Notes (Signed)
  Subjective:    Patient ID: Susan Esparza, female    DOB: 04-20-1955, 57 y.o.   MRN: [id number]  HPI  57 year old white female previously seen for right leg pain for followup. Patient was experiencing intermittent sharp pain in back of right knee. There is no specific injury or trauma. She has history of DVT. Venous Doppler of right lower extremity was negative for blood clot. Patient continues to have intermittent symptoms. She called her orthopedic specialist and has an appointment in late February.  Patient reports history of degenerative joint disease and right knee. The ultrasound technician did not see any signs of Baker cyst.  Review of Systems Negative for fever or chills  Past Medical History  Diagnosis Date  . Allergic rhinitis   . Depression   . Acute meniscal tear of knee   . Ocular rosacea   . DVT (deep vein thrombosis) in pregnancy 2009    left leg after knee Arthroscopy    History   Social History  . Marital Status: Unknown    Spouse Name: N/A    Number of Children: N/A  . Years of Education: N/A   Occupational History  . Not on file.   Social History Main Topics  . Smoking status: Never Smoker   . Smokeless tobacco: Not on file  . Alcohol Use: Yes  . Drug Use: No  . Sexually Active: Not on file   Other Topics Concern  . Not on file   Social History Narrative   ** Merged History Encounter **     Past Surgical History  Procedure Date  . Arthroscopy for knee   . Knee arthroscopy 2009    No family history on file.  Allergies  Allergen Reactions  . Bupropion Hcl     REACTION: hives    Current Outpatient Prescriptions on File Prior to Visit  Medication Sig Dispense Refill  . diclofenac (CATAFLAM) 50 MG tablet Take 1 tablet (50 mg total) by mouth 2 (two) times daily.  60 tablet  3  . FLUoxetine (PROZAC) 40 MG capsule TAKE ONE CAPSULE BY MOUTH ONE TIME DAILY  30 capsule  5  . mometasone (NASONEX) 50 MCG/ACT nasal spray Place 2 sprays into the  nose daily.      . phentermine 37.5 MG capsule Take 1 capsule (37.5 mg total) by mouth every morning.  30 capsule  2    BP 142/96  Temp 98.7 F (37.1 C) (Oral)  Wt 179 lb (81.194 kg)       Objective:   Physical Exam  Constitutional: She appears well-developed and well-nourished.  Cardiovascular: Normal rate, regular rhythm and normal heart sounds.   Pulmonary/Chest: Effort normal and breath sounds normal. She has no wheezes.  Musculoskeletal:       Mild crepitus with flexion and extension of right knee, right knee joint is stable, negative Apley's compression test Right popliteal fossa slightly prominent versus left          Assessment & Plan:

## 2012-05-12 NOTE — Assessment & Plan Note (Signed)
Doppler of right knee negative for DVT.  Patient having persistent intermittent symptoms. Consider meniscal tear versus other intra-articular pathology. I agree with consultation with orthopedic specialist. Defer imaging studies to orthopedic specialist. Trial of Celebrex 200 mg once daily in the interim.

## 2012-05-12 NOTE — Patient Instructions (Addendum)
Follow up with your orthopedic physician.

## 2012-06-27 ENCOUNTER — Other Ambulatory Visit: Payer: Self-pay | Admitting: Certified Nurse Midwife

## 2012-06-27 NOTE — Telephone Encounter (Signed)
Patient requesting rx for macrobid. Started having symptoms of frequency and pain yesterday. Had one pill of macrobid left from previous rx-took that yesterday but would like to continue. Please advise if can refill or needs to be seen.

## 2012-06-27 NOTE — Telephone Encounter (Signed)
Can refill Macrobid  Bid x 7 days  # 14 if symptoms are not resolving she needs OV DL

## 2012-06-27 NOTE — Telephone Encounter (Signed)
rx sent, patient aware 

## 2012-07-24 ENCOUNTER — Other Ambulatory Visit: Payer: Self-pay | Admitting: Internal Medicine

## 2012-09-19 ENCOUNTER — Other Ambulatory Visit: Payer: Self-pay | Admitting: Internal Medicine

## 2012-10-09 ENCOUNTER — Telehealth: Payer: Self-pay | Admitting: Internal Medicine

## 2012-10-09 DIAGNOSIS — G4730 Sleep apnea, unspecified: Secondary | ICD-10-CM

## 2012-10-09 NOTE — Telephone Encounter (Signed)
ORDER WAS PUT IN

## 2012-10-09 NOTE — Telephone Encounter (Signed)
Pt hus thinks she has sleep apnea. Pt is requesting a referral for sleep study

## 2012-10-23 ENCOUNTER — Ambulatory Visit (HOSPITAL_BASED_OUTPATIENT_CLINIC_OR_DEPARTMENT_OTHER): Payer: BC Managed Care – PPO | Attending: Internal Medicine | Admitting: Radiology

## 2012-10-23 VITALS — Ht 65.0 in | Wt 180.0 lb

## 2012-10-23 DIAGNOSIS — G4733 Obstructive sleep apnea (adult) (pediatric): Secondary | ICD-10-CM | POA: Insufficient documentation

## 2012-10-23 DIAGNOSIS — G4730 Sleep apnea, unspecified: Secondary | ICD-10-CM

## 2012-10-26 DIAGNOSIS — G4733 Obstructive sleep apnea (adult) (pediatric): Secondary | ICD-10-CM

## 2012-10-27 ENCOUNTER — Encounter: Payer: Self-pay | Admitting: *Deleted

## 2012-10-27 NOTE — Procedures (Signed)
Susan Esparza, Susan Esparza              ACCOUNT NO.:  [id number]  MEDICAL RECORD NO.:  [id number]          PATIENT TYPE:  OUT  LOCATION:  SLEEP CENTER                 FACILITY:  [facility]  PHYSICIAN:  Barbaraann Share, MD,FCCPDATE OF BIRTH:  02/03/1956  DATE OF STUDY:  10/23/2012                           NOCTURNAL POLYSOMNOGRAM  REFERRING PHYSICIAN:  [name], MD  INDICATION FOR STUDY:  Hypersomnia with sleep apnea.  EPWORTH SLEEPINESS SCORE:  13.  MEDICATIONS:  SLEEP ARCHITECTURE:  The patient had a total sleep time of 324 minutes with decreased slow wave sleep as well as REM.  Sleep onset latency was normal at 12 minutes and REM onset was delayed at 144 minutes.  Sleep efficiency was mildly reduced.  RESPIRATORY DATA:  The patient was found to have 2 apneas and 39 obstructive hypopneas, giving her an apnea-hypopnea index of only 8 events per hour.  The events were not positional, and there was moderate- to-loud snoring noted throughout.  The patient did not meet split night protocol secondary to the small numbers of events.  OXYGEN DATA:  There was O2 desaturation as low as 89% with the patient's obstructive events.  CARDIAC DATA:  Occasional PAC noted, but no clinically significant arrhythmias were seen.  MOVEMENT-PARASOMNIA:  The patient had no significant leg jerks or other behavioral abnormalities.  IMPRESSIONS-RECOMMENDATIONS: 1. Very mild obstructive sleep apnea/hypopnea syndrome with an AHI of     8 events per hour and oxygen desaturation as low as 89%.  Treatment     for this degree of sleep apnea can include a trial of weight loss     alone, upper airway surgery, dental appliance, and also CPAP.     Given the mild nature of her sleep apnea, this does not represent a     significant cardiovascular risk.  Therefore, the decision to treat     this aggressively should be     based upon its impact to the patient's quality of life. 2. Occasional premature atrial  contraction noted, but no clinically     significant arrhythmias were seen.     Barbaraann Share, MD,FCCP Diplomate, American Board of Sleep Medicine    [facility]/MED[facility]  D:  10/26/2012 14:18:34  T:  10/27/2012 02:55:21  Job:  [id number]

## 2012-11-03 ENCOUNTER — Ambulatory Visit: Payer: Self-pay | Admitting: Nurse Practitioner

## 2012-11-13 ENCOUNTER — Other Ambulatory Visit: Payer: Self-pay | Admitting: *Deleted

## 2012-11-13 MED ORDER — PHENTERMINE HCL 37.5 MG PO CAPS: 37.5000 mg | ORAL_CAPSULE | ORAL | Status: DC

## 2012-11-21 ENCOUNTER — Telehealth: Payer: Self-pay | Admitting: Internal Medicine

## 2012-11-21 NOTE — Telephone Encounter (Signed)
PT calling to inquire about the results of her sleep study from 10/23/12. Please assist.

## 2012-11-21 NOTE — Telephone Encounter (Signed)
Results given-not   Enough apnea episodes for c-pap.but do suggest weight loss-pt to call insurance and find if there is any where she can go that insurance will pay for

## 2012-11-24 ENCOUNTER — Ambulatory Visit (INDEPENDENT_AMBULATORY_CARE_PROVIDER_SITE_OTHER): Payer: BC Managed Care – PPO | Admitting: Obstetrics & Gynecology

## 2012-11-24 ENCOUNTER — Encounter: Payer: Self-pay | Admitting: Obstetrics & Gynecology

## 2012-11-24 VITALS — BP 132/82 | HR 60 | Resp 12 | Ht 64.75 in | Wt 179.8 lb

## 2012-11-24 DIAGNOSIS — Z01419 Encounter for gynecological examination (general) (routine) without abnormal findings: Secondary | ICD-10-CM

## 2012-11-24 DIAGNOSIS — Z0000 Encounter for general adult medical examination without abnormal findings: Secondary | ICD-10-CM

## 2012-11-24 NOTE — Patient Instructions (Signed)

## 2012-11-24 NOTE — Progress Notes (Signed)
57 y.o. [id number] UnknownCaucasianF here for annual exam.  No vaginal bleeding.  Wants blood work done today.  Non fasting.  Daughter getting married in October.  Has dress.  Trying out different hairstyles.     Patient's last menstrual period was 01/04/2008.          Sexually active: yes  The current method of family planning is vasectomy.    Exercising: yes  walking and yoga Smoker:  no  Health Maintenance: Pap:  11/01/11 WNL/negative HR HPV History of abnormal Pap:  no MMG:  11/04/11 diag mmg and us-normal Colonoscopy:  11/12 repeat in 5 years, Dr. Loreta Ave BMD:   Heel test years ago TDaP:  Up to date Screening Labs: today, Hb today: today, Urine today: WBC-trace, RBC-trace   reports that she has never smoked. She has never used smokeless tobacco. She reports that she drinks about 2.5 ounces of alcohol per week. She reports that she does not use illicit drugs.  Past Medical History  Diagnosis Date  . Allergic rhinitis   . Depression   . Acute meniscal tear of knee   . Ocular rosacea   . DVT (deep vein thrombosis) in pregnancy 2009    left leg after knee Arthroscopy  . DVT (deep venous thrombosis) 05/2007  . Nasal polyps   . Anxiety     Past Surgical History  Procedure Laterality Date  . Arthroscopy for knee    . Knee arthroscopy  2009  . Facial cosmetic surgery  2006  . Polypectomy  01/2008    nasal surgery    Current Outpatient Prescriptions  Medication Sig Dispense Refill  . diclofenac (CATAFLAM) 50 MG tablet TAKE ONE TABLET BY MOUTH TWICE DAILY  60 tablet  3  . ergocalciferol (VITAMIN D2) 50000 UNITS capsule Take 50,000 Units by mouth once a week.      Marland Kitchen FLUoxetine (PROZAC) 40 MG capsule TAKE ONE CAPSULE BY MOUTH ONE TIME DAILY  30 capsule  5  . ibuprofen (ADVIL,MOTRIN) 200 MG tablet Take 200 mg by mouth every 6 (six) hours as needed for pain.      . nitrofurantoin, macrocrystal-monohydrate, (MACROBID) 100 MG capsule Take 100 mg by mouth 2 (two) times daily.      Marland Kitchen  LORazepam (ATIVAN) 0.5 MG tablet Take 0.5 mg by mouth every 8 (eight) hours.      . mometasone (NASONEX) 50 MCG/ACT nasal spray Place 2 sprays into the nose daily.      . phentermine 37.5 MG capsule Take 1 capsule (37.5 mg total) by mouth every morning.  30 capsule  2   No current facility-administered medications for this visit.    Family History  Problem Relation Age of Onset  . Hypertension Mother   . Osteoarthritis Mother   . Cancer Father     colon cancer  . Heart disease Father     tacycardia  . Diabetes Maternal Grandfather     ROS:  Pertinent items are noted in HPI.  Otherwise, a comprehensive ROS was negative.  Exam:   BP 132/82  Pulse 60  Resp 12  Ht 5' 4.75" (1.645 m)  Wt 179 lb 12.8 oz (81.557 kg)  BMI 30.14 kg/m2  LMP 01/04/2008  Weight change: +5  Height: 5' 4.75" (164.5 cm)  Ht Readings from Last 3 Encounters:  11/24/12 5' 4.75" (1.645 m)  10/23/12 5\' 5"  (1.651 m)  02/14/12 5\' 3"  (1.6 m)    General appearance: alert, cooperative and appears stated age Head: Normocephalic, without  obvious abnormality, atraumatic Neck: no adenopathy, supple, symmetrical, trachea midline and thyroid normal to inspection and palpation Lungs: clear to auscultation bilaterally Breasts: normal appearance, no masses or tenderness Heart: regular rate and rhythm Abdomen: soft, non-tender; bowel sounds normal; no masses,  no organomegaly Extremities: extremities normal, atraumatic, no cyanosis or edema Skin: Skin color, texture, turgor normal. No rashes or lesions Lymph nodes: Cervical, supraclavicular, and axillary nodes normal. No abnormal inguinal nodes palpated Neurologic: Grossly normal   Pelvic: External genitalia:  no lesions              Urethra:  normal appearing urethra with no masses, tenderness or lesions              Bartholins and Skenes: normal                 Vagina: normal appearing vagina with normal color and discharge, no lesions              Cervix: no  lesions              Pap taken: no Bimanual Exam:  Uterus:  normal size, contour, position, consistency, mobility, non-tender              Adnexa: normal adnexa and no mass, fullness, tenderness               Rectovaginal: Confirms               Anus:  normal sphincter tone, no lesions  A:  Well Woman with normal exam PMP, no HRT H/O DVT Vit D deficiency  P:   Mammogram yearly.  Doing 3D MMG pap smear with neg HR HPV 7/13 TSH, Vit D, CMP, Lipids (nonfasting) return annually or prn  An After Visit Summary was printed and given to the patient.

## 2012-11-25 LAB — VITAMIN D 25 HYDROXY (VIT D DEFICIENCY, FRACTURES): Vit D, 25-Hydroxy: 42 ng/mL (ref 30–89)

## 2012-11-25 LAB — COMPREHENSIVE METABOLIC PANEL
Albumin: 4.5 g/dL (ref 3.5–5.2)
CO2: 29 mEq/L (ref 19–32)
Glucose, Bld: 78 mg/dL (ref 70–99)
Potassium: 4.9 mEq/L (ref 3.5–5.3)
Sodium: 138 mEq/L (ref 135–145)
Total Bilirubin: 0.8 mg/dL (ref 0.3–1.2)
Total Protein: 7.3 g/dL (ref 6.0–8.3)

## 2012-11-25 LAB — LIPID PANEL
Cholesterol: 203 mg/dL — ABNORMAL HIGH (ref 0–200)
Total CHOL/HDL Ratio: 3 Ratio
Triglycerides: 89 mg/dL (ref <150)

## 2012-11-27 ENCOUNTER — Telehealth: Payer: Self-pay

## 2012-11-27 NOTE — Telephone Encounter (Signed)
Message copied by Elisha Headland on Mon Nov 27, 2012 10:57 AM ------      Message from: Jerene Bears      Created: Sat Nov 25, 2012  8:05 PM       Inform cholesterol is a little elevated--total 203 and LDL 118 but TG's and HDLs good.  This was non-fasting.  Does not need to repeat.  TSH nl.  CMP nl.  Vit D nl.  Needs to continue 50,000 IU Vit D weekly.  Rx sent to pharmacy. ------

## 2012-11-27 NOTE — Telephone Encounter (Signed)
8/25 lmtcb

## 2012-12-01 ENCOUNTER — Encounter: Payer: Self-pay | Admitting: Obstetrics & Gynecology

## 2012-12-07 NOTE — Telephone Encounter (Signed)
Patient aware.

## 2012-12-19 ENCOUNTER — Other Ambulatory Visit: Payer: Self-pay | Admitting: Internal Medicine

## 2012-12-25 ENCOUNTER — Other Ambulatory Visit: Payer: Self-pay | Admitting: Nurse Practitioner

## 2012-12-25 NOTE — Telephone Encounter (Signed)
Patient was seen 11/24/12 for AEX no rx was given/sent. According to last Vit D patient is to continue Vit D 50,000 q wk.  Please advise.

## 2013-01-16 ENCOUNTER — Encounter: Payer: Self-pay | Admitting: Internal Medicine

## 2013-04-19 ENCOUNTER — Other Ambulatory Visit: Payer: Self-pay | Admitting: Nurse Practitioner

## 2013-04-20 NOTE — Telephone Encounter (Signed)
Last Refilled: 12/23/11 #30/1 refill Last AEX was 11/24/12 no rx was given/sent  Please advise  Chart In Your [employer]

## 2013-05-24 ENCOUNTER — Encounter: Payer: Self-pay | Admitting: Nurse Practitioner

## 2013-05-24 ENCOUNTER — Ambulatory Visit (INDEPENDENT_AMBULATORY_CARE_PROVIDER_SITE_OTHER): Payer: 59 | Admitting: Nurse Practitioner

## 2013-05-24 VITALS — BP 124/70 | HR 64 | Ht 64.75 in | Wt 188.0 lb

## 2013-05-24 DIAGNOSIS — E669 Obesity, unspecified: Secondary | ICD-10-CM

## 2013-05-24 NOTE — Progress Notes (Signed)
Reviewed personally.  M. Suzanne Celest Reitz, MD.  

## 2013-05-24 NOTE — Patient Instructions (Signed)
To see Dr. Arnoldo Morale

## 2013-05-24 NOTE — Progress Notes (Signed)
Patient ID: Susan Esparza, female   DOB: Sep 29, 1955, 58 y.o.   MRN: [id number] S: This 58 yo WM Fe here to discuss taking Vyvance.  In the past took this med at 50 mg and did well with diet and appetite suppressant.  Has been off since 2012. In the past took phentermine by PCP and then it stopped working.  Now wants restart Vyvance at a lower dosage since she thinks it may have also caused BP to be elevated.  The only other med is Prozac started for menopausal symptoms.  She does have less vaso symptoms.   A:    Weight gain and wants appetite suppressant  P:   Discussed with Dr. Sabra Heck             Since we do not prescribe these med's she is referred to see PCP Dr. Arnoldo Morale.  She will need close monitoring of this med if she does start. She is agreeable to see PCP.  Consult time with Dr. Sabra Heck and face to face consultation of 10 minutes.

## 2013-07-02 ENCOUNTER — Encounter: Payer: Self-pay | Admitting: Internal Medicine

## 2013-07-02 ENCOUNTER — Ambulatory Visit (INDEPENDENT_AMBULATORY_CARE_PROVIDER_SITE_OTHER): Payer: No Typology Code available for payment source | Admitting: Internal Medicine

## 2013-07-02 VITALS — BP 110/72 | HR 89 | Temp 98.6°F | Ht 64.75 in | Wt 186.5 lb

## 2013-07-02 DIAGNOSIS — F900 Attention-deficit hyperactivity disorder, predominantly inattentive type: Secondary | ICD-10-CM

## 2013-07-02 DIAGNOSIS — F988 Other specified behavioral and emotional disorders with onset usually occurring in childhood and adolescence: Secondary | ICD-10-CM

## 2013-07-02 MED ORDER — AMPHETAMINE-DEXTROAMPHETAMINE 20 MG PO TABS: 20.0000 mg | ORAL_TABLET | Freq: Two times a day (BID) | ORAL | Status: DC

## 2013-07-02 NOTE — Progress Notes (Signed)
Pre visit review using our clinic review tool, if applicable. No additional management support is needed unless otherwise documented below in the visit note. 

## 2013-07-02 NOTE — Progress Notes (Signed)
   Subjective:    Patient ID: Susan Esparza, female    DOB: 1955/05/04, 58 y.o.   MRN: [id number]  HPI  Wants to resume a lower does of vyvance due to ADD ADD testing today  I have spent more than 30 minutes examining this patient face-to-face of which over half was spent in counseling   Review of Systems  Constitutional: Negative for activity change, appetite change and fatigue.  HENT: Negative for congestion, ear pain, postnasal drip and sinus pressure.   Eyes: Negative for redness and visual disturbance.  Respiratory: Negative for cough, shortness of breath and wheezing.   Gastrointestinal: Negative for abdominal pain and abdominal distention.  Genitourinary: Negative for dysuria, frequency and menstrual problem.  Musculoskeletal: Negative for arthralgias, joint swelling, myalgias and neck pain.  Skin: Negative for rash and wound.  Neurological: Negative for dizziness, weakness and headaches.  Hematological: Negative for adenopathy. Does not bruise/bleed easily.  Psychiatric/Behavioral: Negative for sleep disturbance and decreased concentration.       Objective:   Physical Exam  Constitutional: She is oriented to person, place, and time. She appears well-developed and well-nourished.  HENT:  Head: Normocephalic and atraumatic.  Eyes: Conjunctivae are normal. Pupils are equal, round, and reactive to light.  Neck: Neck supple.  Cardiovascular: Normal rate and regular rhythm.  Exam reveals no gallop.   No murmur heard. Pulmonary/Chest: Effort normal and breath sounds normal.  Abdominal: Soft.  Neurological: She is alert and oriented to person, place, and time.  Skin: Skin is warm and dry.  Psychiatric: She has a normal mood and affect. Her behavior is normal.          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

## 2013-07-02 NOTE — Patient Instructions (Signed)
The patient is instructed to continue all medications as prescribed. Schedule followup with check out clerk upon leaving the clinic  

## 2013-07-03 ENCOUNTER — Other Ambulatory Visit: Payer: Self-pay | Admitting: Internal Medicine

## 2013-08-21 ENCOUNTER — Telehealth: Payer: Self-pay | Admitting: Internal Medicine

## 2013-08-21 MED ORDER — DICLOFENAC POTASSIUM 50 MG PO TABS: ORAL_TABLET | ORAL | Status: DC

## 2013-08-21 NOTE — Telephone Encounter (Signed)
[facility] is requesting re-fill on diclofenac (CATAFLAM) 50 MG tablet

## 2013-10-05 ENCOUNTER — Other Ambulatory Visit: Payer: Self-pay | Admitting: Internal Medicine

## 2013-10-25 ENCOUNTER — Telehealth: Payer: Self-pay | Admitting: Obstetrics & Gynecology

## 2013-10-25 MED ORDER — NITROFURANTOIN MONOHYD MACRO 100 MG PO CAPS: 100.0000 mg | ORAL_CAPSULE | Freq: Two times a day (BID) | ORAL | Status: DC

## 2013-10-25 NOTE — Telephone Encounter (Signed)
Please send Rx to her pharmacy of choice for Macrobid 100 mg po bid for 5 days.  Needs office visit next week if symptoms are not resolved.   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  We usually do request office visits for treatment of infection.

## 2013-10-25 NOTE — Telephone Encounter (Signed)
Spoke with patient. Advised of message as seen below from [place]. Patient agreeable. Rx sent to [facility] pharmacy [phone].  Routing to provider for final review. Patient agreeable to disposition. Will close encounter

## 2013-10-25 NOTE — Telephone Encounter (Signed)
Patient is out of town and will not be back in town til Wednesday next week. She thinks she has a uti symptoms started yesterday. Frequent urination pain with urination and blood in urine. Said she has a history or these and has medication but it is at home and she is hoping we could send her something in to a pharmacy near her. I advised we typically want patients to come in for appointments  But i would send a message to the nurse for her   Pharmacy: [facility] [phone]

## 2013-10-25 NOTE — Telephone Encounter (Signed)
Pulled patient's paper chart. Patient has history of post coital UTI and E.coli UTI's. Patient was last seen 11/24/12 for annual exam. Paper chart to Dr.Silva's desk for review and advise.

## 2013-11-19 ENCOUNTER — Telehealth: Payer: Self-pay | Admitting: Certified Nurse Midwife

## 2013-11-19 NOTE — Telephone Encounter (Signed)
Patient returned call. She took Macrobid 100 mg PO bid for 5 days starting on 10/25/13. Patient states she felt improved after treatment until this morning and developed dysuria. Has taken uribel and felt improved today. She states she is out of town and cannot come in for office visit. No fevers, flank pain or nausea.  Advised cannot do telephone treatment again as she needs urine culture to ensure correct bacteria is being treated as just recently was treated for UTI. Advised understanding that she is out of town but that she needs to seek care at local urgent care for evaluation and urine culture.  Patient thinks she may be able to rearrange her schedule and requests late afternoon appointment for tomorrow.  Offered 1615 with Milford Cage, FNP on 11/20/13. Patient is advised to not wait to seek care if develops fevers, flank pain, abdominal pain, nausea or vomiting. Patient agrees.  Routing to provider for final review. Patient agreeable to disposition. Will close encounter

## 2013-11-19 NOTE — Telephone Encounter (Signed)
Patient said she thinks she has a uti unable to come in for appt til Wednesday said that she wants to know if debbi could give her something until she comes in for appt. Scheduled pt an appt for 11/21/13 at 2:30.    [facility] [phone]

## 2013-11-19 NOTE — Telephone Encounter (Signed)
Patient was treated with Macrobid 100 mg po bid x 5 days on 10/25/13 via phone encounter.   Message left to return call to [place] at [phone] for triage.

## 2013-11-20 ENCOUNTER — Telehealth: Payer: Self-pay | Admitting: Nurse Practitioner

## 2013-11-20 ENCOUNTER — Ambulatory Visit: Payer: 59 | Admitting: Nurse Practitioner

## 2013-11-20 NOTE — Telephone Encounter (Signed)
Pt rescheduled her appt today because she is out of town and will not be back. Appointment was already rescheduled from Wednesday so she will be charged a missed appt fee. Rescheduled back to Debbie's schedule for tomorrow.

## 2013-11-21 ENCOUNTER — Encounter: Payer: Self-pay | Admitting: Certified Nurse Midwife

## 2013-11-21 ENCOUNTER — Ambulatory Visit (INDEPENDENT_AMBULATORY_CARE_PROVIDER_SITE_OTHER): Payer: 59 | Admitting: Certified Nurse Midwife

## 2013-11-21 ENCOUNTER — Ambulatory Visit: Payer: 59 | Admitting: Certified Nurse Midwife

## 2013-11-21 VITALS — BP 114/70 | HR 72 | Temp 98.4°F | Resp 16 | Ht 64.75 in | Wt 184.0 lb

## 2013-11-21 DIAGNOSIS — N390 Urinary tract infection, site not specified: Secondary | ICD-10-CM

## 2013-11-21 DIAGNOSIS — B3731 Acute candidiasis of vulva and vagina: Secondary | ICD-10-CM

## 2013-11-21 DIAGNOSIS — B373 Candidiasis of vulva and vagina: Secondary | ICD-10-CM

## 2013-11-21 LAB — POCT URINALYSIS DIPSTICK
Bilirubin, UA: NEGATIVE
Blood, UA: NEGATIVE
Glucose, UA: NEGATIVE
KETONES UA: NEGATIVE
NITRITE UA: NEGATIVE
PROTEIN UA: NEGATIVE
Urobilinogen, UA: NEGATIVE
pH, UA: 8

## 2013-11-21 MED ORDER — CIPROFLOXACIN HCL 500 MG PO TABS: 500.0000 mg | ORAL_TABLET | Freq: Two times a day (BID) | ORAL | Status: DC

## 2013-11-21 MED ORDER — FLUCONAZOLE 150 MG PO TABS: ORAL_TABLET | ORAL | Status: DC

## 2013-11-21 NOTE — Patient Instructions (Signed)
Monilial Vaginitis Vaginitis in a soreness, swelling and redness (inflammation) of the vagina and vulva. Monilial vaginitis is not a sexually transmitted infection. CAUSES  Yeast vaginitis is caused by yeast (candida) that is normally found in your vagina. With a yeast infection, the candida has overgrown in number to a point that upsets the chemical balance. SYMPTOMS   White, thick vaginal discharge.  Swelling, itching, redness and irritation of the vagina and possibly the lips of the vagina (vulva).  Burning or painful urination.  Painful intercourse. DIAGNOSIS  Things that may contribute to monilial vaginitis are:  Postmenopausal and virginal states.  Pregnancy.  Infections.  Being tired, sick or stressed, especially if you had monilial vaginitis in the past.  Diabetes. Good control will help lower the chance.  Birth control pills.  Tight fitting garments.  Using bubble bath, feminine sprays, douches or deodorant tampons.  Taking certain medications that kill germs (antibiotics).  Sporadic recurrence can occur if you become ill. TREATMENT  Your caregiver will give you medication.  There are several kinds of anti monilial vaginal creams and suppositories specific for monilial vaginitis. For recurrent yeast infections, use a suppository or cream in the vagina 2 times a week, or as directed.  Anti-monilial or steroid cream for the itching or irritation of the vulva may also be used. Get your caregiver's permission.  Painting the vagina with methylene blue solution may help if the monilial cream does not work.  Eating yogurt may help prevent monilial vaginitis. HOME CARE INSTRUCTIONS   Finish all medication as prescribed.  Do not have sex until treatment is completed or after your caregiver tells you it is okay.  Take warm sitz baths.  Do not douche.  Do not use tampons, especially scented ones.  Wear cotton underwear.  Avoid tight pants and panty  hose.  Tell your sexual partner that you have a yeast infection. They should go to their caregiver if they have symptoms such as mild rash or itching.  Your sexual partner should be treated as well if your infection is difficult to eliminate.  Practice safer sex. Use condoms.  Some vaginal medications cause latex condoms to fail. Vaginal medications that harm condoms are:  Cleocin cream.  Butoconazole (Femstat).  Terconazole (Terazol) vaginal suppository.  Miconazole (Monistat) (may be purchased over the counter). SEEK MEDICAL CARE IF:   You have a temperature by mouth above 102 F (38.9 C).  The infection is getting worse after 2 days of treatment.  The infection is not getting better after 3 days of treatment.  You develop blisters in or around your vagina.  You develop vaginal bleeding, and it is not your menstrual period.  You have pain when you urinate.  You develop intestinal problems.  You have pain with sexual intercourse. Document Released: 12/30/2004 Document Revised: 06/14/2011 Document Reviewed: 09/13/2008 ExitCare Patient Information 2015 [employer]. This information is not intended to replace advice given to you by your health care provider. Make sure you discuss any questions you have with your health care provider. Urinary Tract Infection Urinary tract infections (UTIs) can develop anywhere along your urinary tract. Your urinary tract is your body's drainage system for removing wastes and extra water. Your urinary tract includes two kidneys, two ureters, a bladder, and a urethra. Your kidneys are a pair of bean-shaped organs. Each kidney is about the size of your fist. They are located below your ribs, one on each side of your spine. CAUSES Infections are caused by microbes, which are   microscopic organisms, including fungi, viruses, and bacteria. These organisms are so small that they can only be seen through a microscope. Bacteria are the microbes that  most commonly cause UTIs. SYMPTOMS  Symptoms of UTIs may vary by age and gender of the patient and by the location of the infection. Symptoms in young women typically include a frequent and intense urge to urinate and a painful, burning feeling in the bladder or urethra during urination. Older women and men are more likely to be tired, shaky, and weak and have muscle aches and abdominal pain. A fever may mean the infection is in your kidneys. Other symptoms of a kidney infection include pain in your back or sides below the ribs, nausea, and vomiting. DIAGNOSIS To diagnose a UTI, your caregiver will ask you about your symptoms. Your caregiver also will ask to provide a urine sample. The urine sample will be tested for bacteria and white blood cells. White blood cells are made by your body to help fight infection. TREATMENT  Typically, UTIs can be treated with medication. Because most UTIs are caused by a bacterial infection, they usually can be treated with the use of antibiotics. The choice of antibiotic and length of treatment depend on your symptoms and the type of bacteria causing your infection. HOME CARE INSTRUCTIONS  If you were prescribed antibiotics, take them exactly as your caregiver instructs you. Finish the medication even if you feel better after you have only taken some of the medication.  Drink enough water and fluids to keep your urine clear or pale yellow.  Avoid caffeine, tea, and carbonated beverages. They tend to irritate your bladder.  Empty your bladder often. Avoid holding urine for long periods of time.  Empty your bladder before and after sexual intercourse.  After a bowel movement, women should cleanse from front to back. Use each tissue only once. SEEK MEDICAL CARE IF:   You have back pain.  You develop a fever.  Your symptoms do not begin to resolve within 3 days. SEEK IMMEDIATE MEDICAL CARE IF:   You have severe back pain or lower abdominal pain.  You  develop chills.  You have nausea or vomiting.  You have continued burning or discomfort with urination. MAKE SURE YOU:   Understand these instructions.  Will watch your condition.  Will get help right away if you are not doing well or get worse. Document Released: 12/30/2004 Document Revised: 09/21/2011 Document Reviewed: 04/30/2011 ExitCare Patient Information 2015 [employer]. This information is not intended to replace advice given to you by your health care provider. Make sure you discuss any questions you have with your health care provider.  

## 2013-11-21 NOTE — Progress Notes (Signed)
58 y.o.married white female [id number] here with complaint of UTI, with onset  on 2 days.. Patient complaining of urinary frequency/urgency/ and pain with urination. Patient denies fever, chills, nausea or back pain. No new personal products. Patient feels related to sexual activity. Denies any vaginal symptoms or new personal products.  Menopausal with vaginal dryness. Patient took one Macrobid that she had two days ago, symptoms felt better but have continued "really" since treated 10/30/13.  O: Healthy female WDWN Affect: Normal, orientation x 3 Skin : warm and dry CVAT: negative bilateral Abdomen: positive for suprapubic tenderness  Pelvic exam: External genital area: normal, no lesions Bladder,Urethra, Urethral meatus: tender Vagina:thick white vaginal discharge, normal appearance ph 4.0 Wet prep taken Cervix: normal, non tender Uterus:normal,non tender Adnexa: normal non tender, no fullness or masses  Wet prep: positive for yeast  Poct urine-ph 8.0, wbc 2+ A: UTI Yeast vaginitis  P: Reviewed findings of UTI and yeast vaginitis. Discussed etiology and need for treatment. Discussed yeast can also be passed from partner to partner. Check with spouse if symptoms, needs treatment. Questions addressed QS:XQKSK see order SHN:GITJL micro, culture Reviewed warning signs and symptoms of UTI Encouraged to limit soda, tea, and coffee RV 2 week if TOC needed for positive culture Rx Diflucan see order.   RV prn

## 2013-11-22 LAB — URINALYSIS, MICROSCOPIC ONLY
Bacteria, UA: NONE SEEN
CASTS: NONE SEEN
Crystals: NONE SEEN
SQUAMOUS EPITHELIAL / LPF: NONE SEEN

## 2013-11-22 NOTE — Progress Notes (Signed)
Reviewed personally.  M. Suzanne Jahniyah Revere, MD.  

## 2013-11-23 LAB — URINE CULTURE: Colony Count: 50000

## 2013-11-25 NOTE — Addendum Note (Signed)
Addended by: Regina Eck on: 11/25/2013 02:56 PM   Modules accepted: Orders

## 2013-11-26 ENCOUNTER — Telehealth: Payer: Self-pay

## 2013-11-26 NOTE — Telephone Encounter (Signed)
Message copied by Susy Manor on Mon Nov 26, 2013  1:47 PM ------      Message from: Regina Eck      Created: Sun Nov 25, 2013  2:54 PM       Notify patient that she had low growth of group B strep in culture. TOC 2 weeks, if negative can refill Macrobid for post coital use      Order in ------

## 2013-11-26 NOTE — Telephone Encounter (Signed)
Pt informed of results. Pt voiced understanding and made Nurse Appt for 9/8

## 2013-11-26 NOTE — Telephone Encounter (Signed)
lmtcb

## 2013-12-03 ENCOUNTER — Encounter: Payer: Self-pay | Admitting: Obstetrics & Gynecology

## 2013-12-03 ENCOUNTER — Ambulatory Visit (INDEPENDENT_AMBULATORY_CARE_PROVIDER_SITE_OTHER): Payer: 59 | Admitting: Obstetrics & Gynecology

## 2013-12-03 VITALS — BP 132/82 | HR 72 | Ht 64.75 in | Wt 187.0 lb

## 2013-12-03 DIAGNOSIS — Z01419 Encounter for gynecological examination (general) (routine) without abnormal findings: Secondary | ICD-10-CM

## 2013-12-03 DIAGNOSIS — Z124 Encounter for screening for malignant neoplasm of cervix: Secondary | ICD-10-CM

## 2013-12-03 DIAGNOSIS — N390 Urinary tract infection, site not specified: Secondary | ICD-10-CM

## 2013-12-03 DIAGNOSIS — Z0000 Encounter for general adult medical examination without abnormal findings: Secondary | ICD-10-CM

## 2013-12-03 LAB — POCT URINALYSIS DIPSTICK
Bilirubin, UA: NEGATIVE
Blood, UA: NEGATIVE
Glucose, UA: NEGATIVE
Ketones, UA: NEGATIVE
Leukocytes, UA: NEGATIVE
Nitrite, UA: NEGATIVE
PH UA: 6
PROTEIN UA: NEGATIVE
UROBILINOGEN UA: NEGATIVE

## 2013-12-03 LAB — HEMOGLOBIN, FINGERSTICK: HEMOGLOBIN, FINGERSTICK: 14.5 g/dL (ref 12.0–16.0)

## 2013-12-03 MED ORDER — NITROFURANTOIN MONOHYD MACRO 100 MG PO CAPS: 100.0000 mg | ORAL_CAPSULE | ORAL | Status: DC | PRN

## 2013-12-03 NOTE — Progress Notes (Signed)
58 y.o. [id number] MarriedCaucasianF here for annual exam.  Doing well.  Was in [place] for a month this summer.  Brother lives in [place].  No vaginal bleeding.  Has had several UTIs this past year.  Doesn't take the Macrobid every time after intercourse.    Going to [place] in the fall for 10 days.  Really excited about this.  Patient's last menstrual period was 01/04/2008.          Sexually active: Yes.    The current method of family planning is post menopausal status.    Exercising: Yes.    Home exercise routine includes walking at least 30 mins a day. Smoker:  no  Health Maintenance: Pap:  11/01/11 WNL/neg HR HPV History of abnormal Pap:  no MMG:  01/02/13 Bi-Rads Benign Colonoscopy:  11/12 repeat in 5 years, Dr. Collene Mares BMD:   Heel Test years ago TDaP:  Up to date Screening Labs: taday, Hb today: 14.5, Urine today: neg, ph: 6.0   reports that she has never smoked. She has never used smokeless tobacco. She reports that she drinks about 2.5 ounces of alcohol per week. She reports that she does not use illicit drugs.  Past Medical History  Diagnosis Date  . Allergic rhinitis   . Depression   . Acute meniscal tear of knee   . Ocular rosacea   . DVT (deep vein thrombosis) in pregnancy 2009    left leg after knee Arthroscopy  . DVT (deep venous thrombosis) 05/2007    Lupus anticoagulant +  . Nasal polyps   . Anxiety     Past Surgical History  Procedure Laterality Date  . Knee arthroscopy Right 1989  . Knee arthroscopy Left 2009  . Facial cosmetic surgery  2006  . Nasal polyp surgery  01/2008         Current Outpatient Prescriptions  Medication Sig Dispense Refill  . diclofenac (CATAFLAM) 50 MG tablet TAKE ONE TABLET BY MOUTH TWICE DAILY  60 tablet  3  . FLUoxetine (PROZAC) 40 MG capsule TAKE ONE CAPSULE BY MOUTH ONE TIME DAILY   30 capsule  5  . LORazepam (ATIVAN) 0.5 MG tablet Take one tablet by mouth daily as needed  30 tablet  0  . nitrofurantoin, macrocrystal-monohydrate,  (MACROBID) 100 MG capsule Take 100 mg by mouth. Take 1 post coital prn       No current facility-administered medications for this visit.    Family History  Problem Relation Age of Onset  . Hypertension Mother   . Osteoarthritis Mother   . Cancer Father     colon cancer  . Heart disease Father     tacycardia  . Diabetes Maternal Grandfather     ROS:  Pertinent items are noted in HPI.  Otherwise, a comprehensive ROS was negative.  Exam:   BP 132/82  Pulse 72  Ht 5' 4.75" (1.645 m)  Wt 187 lb (84.823 kg)  BMI 31.35 kg/m2  LMP 01/04/2008  Weight change: @WEIGHTCHANGE @ Height:   Height: 5' 4.75" (164.5 cm)  Ht Readings from Last 3 Encounters:  12/03/13 5' 4.75" (1.645 m)  11/21/13 5' 4.75" (1.645 m)  07/02/13 5' 4.75" (1.645 m)    General appearance: alert, cooperative and appears stated age Head: Normocephalic, without obvious abnormality, atraumatic Neck: no adenopathy, supple, symmetrical, trachea midline and thyroid normal to inspection and palpation Lungs: clear to auscultation bilaterally Breasts: normal appearance, no masses or tenderness Heart: regular rate and rhythm Abdomen: soft, non-tender; bowel sounds normal;  no masses,  no organomegaly Extremities: extremities normal, atraumatic, no cyanosis or edema Skin: Skin color, texture, turgor normal. No rashes or lesions Lymph nodes: Cervical, supraclavicular, and axillary nodes normal. No abnormal inguinal nodes palpated Neurologic: Grossly normal   Pelvic: External genitalia:  no lesions              Urethra:  normal appearing urethra with no masses, tenderness or lesions              Bartholins and Skenes: normal                 Vagina: normal appearing vagina with normal color and discharge, no lesions              Cervix: no lesions              Pap taken: Yes.   Bimanual Exam:  Uterus:  normal size, contour, position, consistency, mobility, non-tender              Adnexa: normal adnexa and no mass,  fullness, tenderness               Rectovaginal: Confirms               Anus:  normal sphincter tone, no lesions  A:  Well Woman with normal exam  PMP, no HRT  H/O DVT post knee arthroscopy Vit D deficiency  UTI  P: Mammogram yearly. Doing 3D MMG  pap smear with neg HR HPV 7/13.  Pap obtained today. Rx for Macrobid 100mg  post coitally.  #30/6RF. return annually or prn  An After Visit Summary was printed and given to the patient.

## 2013-12-04 ENCOUNTER — Telehealth: Payer: Self-pay | Admitting: Nurse Practitioner

## 2013-12-04 LAB — URINE CULTURE
Colony Count: NO GROWTH
Organism ID, Bacteria: NO GROWTH

## 2013-12-04 NOTE — Telephone Encounter (Signed)
Patient was charged a $50.00 dnka fee for 11/20/13. Patient says she was told there would not be a charge. Patient is asking if can be waived.

## 2013-12-05 LAB — IPS PAP TEST WITH REFLEX TO HPV

## 2013-12-11 ENCOUNTER — Ambulatory Visit: Payer: Self-pay

## 2013-12-13 NOTE — Telephone Encounter (Signed)
Left message for patient to return my call regarding waived dnka fee.

## 2013-12-13 NOTE — Telephone Encounter (Signed)
Please advise patient fee will be waived this one time as a courtesy. Remind patient of policy in future.

## 2013-12-24 ENCOUNTER — Other Ambulatory Visit: Payer: No Typology Code available for payment source

## 2013-12-25 ENCOUNTER — Other Ambulatory Visit: Payer: No Typology Code available for payment source

## 2013-12-25 ENCOUNTER — Other Ambulatory Visit (INDEPENDENT_AMBULATORY_CARE_PROVIDER_SITE_OTHER): Payer: PRIVATE HEALTH INSURANCE

## 2013-12-25 DIAGNOSIS — Z0000 Encounter for general adult medical examination without abnormal findings: Secondary | ICD-10-CM

## 2013-12-25 LAB — CBC WITH DIFFERENTIAL/PLATELET
Basophils Absolute: 0 10*3/uL (ref 0.0–0.1)
Basophils Relative: 0.4 % (ref 0.0–3.0)
EOS PCT: 3.8 % (ref 0.0–5.0)
Eosinophils Absolute: 0.2 10*3/uL (ref 0.0–0.7)
HCT: 41.1 % (ref 36.0–46.0)
Hemoglobin: 14.1 g/dL (ref 12.0–15.0)
LYMPHS ABS: 1.1 10*3/uL (ref 0.7–4.0)
Lymphocytes Relative: 23.2 % (ref 12.0–46.0)
MCHC: 34.4 g/dL (ref 30.0–36.0)
MCV: 90 fl (ref 78.0–100.0)
Monocytes Absolute: 0.4 10*3/uL (ref 0.1–1.0)
Monocytes Relative: 8.4 % (ref 3.0–12.0)
Neutro Abs: 3 10*3/uL (ref 1.4–7.7)
Neutrophils Relative %: 64.2 % (ref 43.0–77.0)
PLATELETS: 220 10*3/uL (ref 150.0–400.0)
RBC: 4.57 Mil/uL (ref 3.87–5.11)
RDW: 13 % (ref 11.5–15.5)
WBC: 4.7 10*3/uL (ref 4.0–10.5)

## 2013-12-25 LAB — HEPATIC FUNCTION PANEL
ALT: 16 U/L (ref 0–35)
AST: 19 U/L (ref 0–37)
Albumin: 4.3 g/dL (ref 3.5–5.2)
Alkaline Phosphatase: 74 U/L (ref 39–117)
Bilirubin, Direct: 0.1 mg/dL (ref 0.0–0.3)
TOTAL PROTEIN: 7.8 g/dL (ref 6.0–8.3)
Total Bilirubin: 0.8 mg/dL (ref 0.2–1.2)

## 2013-12-25 LAB — POCT URINALYSIS DIPSTICK
Bilirubin, UA: NEGATIVE
Blood, UA: NEGATIVE
Glucose, UA: NEGATIVE
Ketones, UA: NEGATIVE
LEUKOCYTES UA: NEGATIVE
Nitrite, UA: NEGATIVE
PH UA: 5.5
PROTEIN UA: NEGATIVE
Spec Grav, UA: 1.01
UROBILINOGEN UA: 0.2

## 2013-12-25 LAB — BASIC METABOLIC PANEL WITH GFR
BUN: 16 mg/dL (ref 6–23)
CO2: 27 meq/L (ref 19–32)
Calcium: 9.4 mg/dL (ref 8.4–10.5)
Chloride: 103 meq/L (ref 96–112)
Creatinine, Ser: 0.7 mg/dL (ref 0.4–1.2)
GFR: 92.95 mL/min
Glucose, Bld: 89 mg/dL (ref 70–99)
Potassium: 4.7 meq/L (ref 3.5–5.1)
Sodium: 137 meq/L (ref 135–145)

## 2013-12-25 LAB — LIPID PANEL
Cholesterol: 204 mg/dL — ABNORMAL HIGH (ref 0–200)
HDL: 61.3 mg/dL (ref >39.00)
LDL Cholesterol: 117 mg/dL — ABNORMAL HIGH (ref 0–99)
NonHDL: 142.7
Total CHOL/HDL Ratio: 3
Triglycerides: 129 mg/dL (ref 0.0–149.0)
VLDL: 25.8 mg/dL (ref 0.0–40.0)

## 2013-12-25 LAB — TSH: TSH: 2.07 u[IU]/mL (ref 0.35–4.50)

## 2013-12-31 ENCOUNTER — Ambulatory Visit (INDEPENDENT_AMBULATORY_CARE_PROVIDER_SITE_OTHER): Payer: PRIVATE HEALTH INSURANCE | Admitting: Family

## 2013-12-31 ENCOUNTER — Encounter: Payer: Self-pay | Admitting: Family

## 2013-12-31 VITALS — BP 112/70 | HR 67 | Ht 64.5 in | Wt 184.9 lb

## 2013-12-31 DIAGNOSIS — Z23 Encounter for immunization: Secondary | ICD-10-CM

## 2013-12-31 DIAGNOSIS — Z0000 Encounter for general adult medical examination without abnormal findings: Secondary | ICD-10-CM

## 2013-12-31 DIAGNOSIS — F411 Generalized anxiety disorder: Secondary | ICD-10-CM

## 2013-12-31 DIAGNOSIS — F988 Other specified behavioral and emotional disorders with onset usually occurring in childhood and adolescence: Secondary | ICD-10-CM

## 2013-12-31 MED ORDER — LISDEXAMFETAMINE DIMESYLATE 30 MG PO CAPS: 30.0000 mg | ORAL_CAPSULE | Freq: Every day | ORAL | Status: DC

## 2013-12-31 NOTE — Progress Notes (Signed)
Pre visit review using our clinic review tool, if applicable. No additional management support is needed unless otherwise documented below in the visit note. 

## 2013-12-31 NOTE — Patient Instructions (Signed)

## 2013-12-31 NOTE — Progress Notes (Signed)
Subjective:    Patient ID: Susan Esparza, female    DOB: 1955-07-18, 58 y.o.   MRN: [id number]  HPI  58 year old white female, nonsmoker,  This is a routine wellness  examination for this patient . I reviewed all health maintenance protocols including mammography, colonoscopy, bone density Needed referrals were placed. Age and diagnosis  appropriate screening labs were ordered. Her immunization history was reviewed and appropriate vaccinations were ordered. Her current medications and allergies were reviewed and needed refills of her chronic medications were ordered. The plan for yearly health maintenance was discussed all orders and referrals were made as appropriate.  She has a history of attention deficit disorder and has been on Adderall in the past. Had concerns that her blood pressure was elevated and stopped the medication. Would like to try Vyvanse instead.She would also like to wean off of Prozac.  Review of Systems  Constitutional: Negative.   HENT: Negative.   Eyes: Negative.   Respiratory: Negative.   Cardiovascular: Negative.   Gastrointestinal: Negative.   Endocrine: Negative.   Genitourinary: Negative.   Musculoskeletal: Negative.   Skin: Negative.   Allergic/Immunologic: Negative.   Neurological: Negative.   Hematological: Negative.   Psychiatric/Behavioral: Negative.    Past Medical History  Diagnosis Date  . Allergic rhinitis   . Depression   . Acute meniscal tear of knee   . Ocular rosacea   . DVT (deep vein thrombosis) in pregnancy 2009    left leg after knee Arthroscopy  . DVT (deep venous thrombosis) 05/2007    Lupus anticoagulant +  . Nasal polyps   . Anxiety     History   Social History  . Marital Status: Married    Spouse Name: N/A    Number of Children: N/A  . Years of Education: N/A   Occupational History  . Not on file.   Social History Main Topics  . Smoking status: Never Smoker   . Smokeless tobacco: Never Used  . Alcohol Use: 2.5  oz/week    5 drink(s) per week  . Drug Use: No  . Sexual Activity: Yes    Partners: Male    Birth Control/ Protection: Other-see comments     Comment: vasectomy   Other Topics Concern  . Not on file   Social History Narrative   ** Merged History Encounter **        Past Surgical History  Procedure Laterality Date  . Knee arthroscopy Right 1989  . Knee arthroscopy Left 2009  . Facial cosmetic surgery  2006  . Nasal polyp surgery  01/2008         Family History  Problem Relation Age of Onset  . Hypertension Mother   . Osteoarthritis Mother   . Cancer Father     colon cancer  . Heart disease Father     tacycardia  . Diabetes Maternal Grandfather     Allergies  Allergen Reactions  . Bupropion Hcl     REACTION: hives  . Penicillins     Current Outpatient Prescriptions on File Prior to Visit  Medication Sig Dispense Refill  . diclofenac (CATAFLAM) 50 MG tablet TAKE ONE TABLET BY MOUTH TWICE DAILY  60 tablet  3  . FLUoxetine (PROZAC) 40 MG capsule TAKE ONE CAPSULE BY MOUTH ONE TIME DAILY   30 capsule  5  . LORazepam (ATIVAN) 0.5 MG tablet Take one tablet by mouth daily as needed  30 tablet  0  . nitrofurantoin, macrocrystal-monohydrate, (MACROBID) 100  MG capsule Take 1 capsule (100 mg total) by mouth as needed.  30 capsule  6   No current facility-administered medications on file prior to visit.    BP 112/70  Pulse 67  Ht 5' 4.5" (1.638 m)  Wt 184 lb 14.4 oz (83.87 kg)  BMI 31.26 kg/m2  LMP 10/01/2009chart    Objective:   Physical Exam  Constitutional: She is oriented to person, place, and time. She appears well-developed and well-nourished.  HENT:  Head: Normocephalic and atraumatic.  Right Ear: External ear normal.  Left Ear: External ear normal.  Nose: Nose normal.  Mouth/Throat: Oropharynx is clear and moist.  Eyes: Conjunctivae and EOM are normal. Pupils are equal, round, and reactive to light.  Neck: Normal range of motion. Neck supple. No  thyromegaly present.  Cardiovascular: Normal rate, regular rhythm and normal heart sounds.   Pulmonary/Chest: Effort normal and breath sounds normal.  Abdominal: Soft. Bowel sounds are normal.  Musculoskeletal: Normal range of motion.  Neurological: She is alert and oriented to person, place, and time. She has normal reflexes.  Skin: Skin is warm and dry.  Psychiatric: She has a normal mood and affect.          Assessment & Plan:  Susan Esparza was seen today for annual exam.  Diagnoses and associated orders for this visit:  Preventative health care  Need for prophylactic vaccination and inoculation against influenza - Flu Vaccine QUAD 36+ mos PF IM (Fluarix Quad PF)  Generalized anxiety disorder  ADD (attention deficit disorder)  Other Orders - lisdexamfetamine (VYVANSE) 30 MG capsule; Take 1 capsule (30 mg total) by mouth daily.    I have advised that we should not start a new medication and wean off her Prozac at the same time. Therefore, she will continue Prozac. Start Vyvanse has scheduled a 30 mg. Recheck in 4 weeks. If that is the therapeutic dose for her we can consider decreasing Prozac at that time. Encouraged healthy diet, exercise, monthly self breast exams.

## 2014-01-28 ENCOUNTER — Encounter: Payer: Self-pay | Admitting: Family

## 2014-01-28 ENCOUNTER — Ambulatory Visit (INDEPENDENT_AMBULATORY_CARE_PROVIDER_SITE_OTHER): Payer: PRIVATE HEALTH INSURANCE | Admitting: Family

## 2014-01-28 VITALS — BP 122/64 | HR 73 | Wt 183.4 lb

## 2014-01-28 DIAGNOSIS — F411 Generalized anxiety disorder: Secondary | ICD-10-CM

## 2014-01-28 DIAGNOSIS — F909 Attention-deficit hyperactivity disorder, unspecified type: Secondary | ICD-10-CM

## 2014-01-28 DIAGNOSIS — F988 Other specified behavioral and emotional disorders with onset usually occurring in childhood and adolescence: Secondary | ICD-10-CM

## 2014-01-28 MED ORDER — LISDEXAMFETAMINE DIMESYLATE 30 MG PO CAPS: 30.0000 mg | ORAL_CAPSULE | Freq: Every day | ORAL | Status: DC

## 2014-01-28 MED ORDER — FLUOXETINE HCL 20 MG PO TABS: 20.0000 mg | ORAL_TABLET | Freq: Every day | ORAL | Status: DC

## 2014-01-28 NOTE — Patient Instructions (Signed)
Exercise to Stay Healthy Exercise helps you become and stay healthy. EXERCISE IDEAS AND TIPS Choose exercises that:  You enjoy.  Fit into your day. You do not need to exercise really hard to be healthy. You can do exercises at a slow or medium level and stay healthy. You can:  Stretch before and after working out.  Try yoga, Pilates, or tai chi.  Lift weights.  Walk fast, swim, jog, run, climb stairs, bicycle, dance, or rollerskate.  Take aerobic classes. Exercises that burn about 150 calories:  Running 1  miles in 15 minutes.  Playing volleyball for 45 to 60 minutes.  Washing and waxing a car for 45 to 60 minutes.  Playing touch football for 45 minutes.  Walking 1  miles in 35 minutes.  Pushing a stroller 1  miles in 30 minutes.  Playing basketball for 30 minutes.  Raking leaves for 30 minutes.  Bicycling 5 miles in 30 minutes.  Walking 2 miles in 30 minutes.  Dancing for 30 minutes.  Shoveling snow for 15 minutes.  Swimming laps for 20 minutes.  Walking up stairs for 15 minutes.  Bicycling 4 miles in 15 minutes.  Gardening for 30 to 45 minutes.  Jumping rope for 15 minutes.  Washing windows or floors for 45 to 60 minutes. Document Released: 04/24/2010 Document Revised: 06/14/2011 Document Reviewed: 04/24/2010 ExitCare Patient Information 2015 [employer]. This information is not intended to replace advice given to you by your health care provider. Make sure you discuss any questions you have with your health care provider.  

## 2014-01-28 NOTE — Progress Notes (Signed)
Subjective:    Patient ID: Susan Esparza, female    DOB: March 23, 1956, 58 y.o.   MRN: [id number]  HPI 58 year old white female with a history  of anxiety, depression, and ADD. She is doing well on Vyvanse. She is wising to start weaning Prozac. No adverse effects from the medication.    Review of Systems  Constitutional: Negative.   Respiratory: Negative.   Cardiovascular: Negative.   Musculoskeletal: Negative.   Skin: Negative.   Neurological: Negative.   Psychiatric/Behavioral: Negative.   All other systems reviewed and are negative.  Past Medical History  Diagnosis Date  . Allergic rhinitis   . Depression   . Acute meniscal tear of knee   . Ocular rosacea   . DVT (deep vein thrombosis) in pregnancy 2009    left leg after knee Arthroscopy  . DVT (deep venous thrombosis) 05/2007    Lupus anticoagulant +  . Nasal polyps   . Anxiety     History   Social History  . Marital Status: Married    Spouse Name: N/A    Number of Children: N/A  . Years of Education: N/A   Occupational History  . Not on file.   Social History Main Topics  . Smoking status: Never Smoker   . Smokeless tobacco: Never Used  . Alcohol Use: 2.5 oz/week    5 drink(s) per week  . Drug Use: No  . Sexual Activity: Yes    Partners: Male    Birth Control/ Protection: Other-see comments     Comment: vasectomy   Other Topics Concern  . Not on file   Social History Narrative   ** Merged History Encounter **        Past Surgical History  Procedure Laterality Date  . Knee arthroscopy Right 1989  . Knee arthroscopy Left 2009  . Facial cosmetic surgery  2006  . Nasal polyp surgery  01/2008         Family History  Problem Relation Age of Onset  . Hypertension Mother   . Osteoarthritis Mother   . Cancer Father     colon cancer  . Heart disease Father     tacycardia  . Diabetes Maternal Grandfather     Allergies  Allergen Reactions  . Bupropion Hcl     REACTION: hives  .  Penicillins     Current Outpatient Prescriptions on File Prior to Visit  Medication Sig Dispense Refill  . diclofenac (CATAFLAM) 50 MG tablet TAKE ONE TABLET BY MOUTH TWICE DAILY  60 tablet  3  . LORazepam (ATIVAN) 0.5 MG tablet Take one tablet by mouth daily as needed  30 tablet  0  . nitrofurantoin, macrocrystal-monohydrate, (MACROBID) 100 MG capsule Take 1 capsule (100 mg total) by mouth as needed.  30 capsule  6   No current facility-administered medications on file prior to visit.    BP 122/64  Pulse 73  Wt 183 lb 6.4 oz (83.19 kg)  LMP 10/01/2009chart    Objective:   Physical Exam  Constitutional: She is oriented to person, place, and time. She appears well-developed and well-nourished.  HENT:  Right Ear: External ear normal.  Left Ear: External ear normal.  Nose: Nose normal.  Mouth/Throat: Oropharynx is clear and moist.  Neck: Normal range of motion. Neck supple.  Cardiovascular: Normal rate, regular rhythm and normal heart sounds.   Musculoskeletal: Normal range of motion.  Neurological: She is alert and oriented to person, place, and time.  Skin: Skin  is warm and dry.          Assessment & Plan:  Susan Esparza was seen today for follow-up.  Diagnoses and associated orders for this visit:  ADD (attention deficit disorder)  Generalized anxiety disorder  Other Orders - Discontinue: lisdexamfetamine (VYVANSE) 30 MG capsule; Take 1 capsule (30 mg total) by mouth daily. - FLUoxetine (PROZAC) 20 MG tablet; Take 1 tablet (20 mg total) by mouth daily. - Discontinue: lisdexamfetamine (VYVANSE) 30 MG capsule; Take 1 capsule (30 mg total) by mouth daily. - lisdexamfetamine (VYVANSE) 30 MG capsule; Take 1 capsule (30 mg total) by mouth daily.     Decrease Prozac to 20mg  daily x 1 month and then to 10 mg thereafter. Recheck in 6 weeks. Continue Vyvanse.

## 2014-01-28 NOTE — Progress Notes (Signed)
Pre visit review using our clinic review tool, if applicable. No additional management support is needed unless otherwise documented below in the visit note. 

## 2014-02-04 ENCOUNTER — Encounter: Payer: Self-pay | Admitting: Family

## 2014-03-04 ENCOUNTER — Encounter: Payer: Self-pay | Admitting: Family

## 2014-03-04 ENCOUNTER — Ambulatory Visit (INDEPENDENT_AMBULATORY_CARE_PROVIDER_SITE_OTHER): Payer: No Typology Code available for payment source | Admitting: Family

## 2014-03-04 VITALS — BP 130/80 | HR 61 | Wt 186.7 lb

## 2014-03-04 DIAGNOSIS — F411 Generalized anxiety disorder: Secondary | ICD-10-CM

## 2014-03-04 DIAGNOSIS — F909 Attention-deficit hyperactivity disorder, unspecified type: Secondary | ICD-10-CM

## 2014-03-04 DIAGNOSIS — F988 Other specified behavioral and emotional disorders with onset usually occurring in childhood and adolescence: Secondary | ICD-10-CM

## 2014-03-04 NOTE — Progress Notes (Signed)
Pre visit review using our clinic review tool, if applicable. No additional management support is needed unless otherwise documented below in the visit note. 

## 2014-03-04 NOTE — Progress Notes (Signed)
Subjective:    Patient ID: Susan Esparza, female    DOB: June 25, 1955, 58 y.o.   MRN: [id number]  HPI 58 year old white female, nonsmoker with a history of attention deficit disorder and anxiety is in today for recheck of anxiety after attempting to wean off of Prozac. She has decreased Prozac down to 20 mg but has decided not to wean any further at this point.  Reports stress from her mother being in an assisted living facility and recently being bit by her dog. Also reports her husband haven't and alcohol dependence issue.   Review of Systems  Constitutional: Negative.   HENT: Negative.   Respiratory: Negative.   Cardiovascular: Negative.   Gastrointestinal: Negative.   Endocrine: Negative.   Genitourinary: Negative.   Musculoskeletal: Negative.   Skin: Negative.   Allergic/Immunologic: Negative.   Neurological: Negative.   Hematological: Negative.   Psychiatric/Behavioral: Negative.    Past Medical History  Diagnosis Date  . Allergic rhinitis   . Depression   . Acute meniscal tear of knee   . Ocular rosacea   . DVT (deep vein thrombosis) in pregnancy 2009    left leg after knee Arthroscopy  . DVT (deep venous thrombosis) 05/2007    Lupus anticoagulant +  . Nasal polyps   . Anxiety     History   Social History  . Marital Status: Married    Spouse Name: N/A    Number of Children: N/A  . Years of Education: N/A   Occupational History  . Not on file.   Social History Main Topics  . Smoking status: Never Smoker   . Smokeless tobacco: Never Used  . Alcohol Use: 2.5 oz/week    5 drink(s) per week  . Drug Use: No  . Sexual Activity:    Partners: Male    Birth Control/ Protection: Other-see comments     Comment: vasectomy   Other Topics Concern  . Not on file   Social History Narrative   ** Merged History Encounter **        Past Surgical History  Procedure Laterality Date  . Knee arthroscopy Right 1989  . Knee arthroscopy Left 2009  . Facial cosmetic  surgery  2006  . Nasal polyp surgery  01/2008         Family History  Problem Relation Age of Onset  . Hypertension Mother   . Osteoarthritis Mother   . Cancer Father     colon cancer  . Heart disease Father     tacycardia  . Diabetes Maternal Grandfather     Allergies  Allergen Reactions  . Bupropion Hcl     REACTION: hives  . Penicillins     Current Outpatient Prescriptions on File Prior to Visit  Medication Sig Dispense Refill  . diclofenac (CATAFLAM) 50 MG tablet TAKE ONE TABLET BY MOUTH TWICE DAILY 60 tablet 3  . FLUoxetine (PROZAC) 20 MG tablet Take 1 tablet (20 mg total) by mouth daily. 30 tablet 3  . lisdexamfetamine (VYVANSE) 30 MG capsule Take 1 capsule (30 mg total) by mouth daily. 30 capsule 0  . LORazepam (ATIVAN) 0.5 MG tablet Take one tablet by mouth daily as needed 30 tablet 0  . nitrofurantoin, macrocrystal-monohydrate, (MACROBID) 100 MG capsule Take 1 capsule (100 mg total) by mouth as needed. 30 capsule 6   No current facility-administered medications on file prior to visit.    BP 130/80 mmHg  Pulse 61  Wt 186 lb 11.2 oz (84.687 kg)  LMP  10/01/2009chart    Objective:   Physical Exam  Constitutional: She is oriented to person, place, and time. She appears well-developed and well-nourished.  HENT:  Right Ear: External ear normal.  Left Ear: External ear normal.  Nose: Nose normal.  Mouth/Throat: Oropharynx is clear and moist.  Neck: Normal range of motion. Neck supple. No thyromegaly present.  Cardiovascular: Normal rate, regular rhythm and normal heart sounds.   Pulmonary/Chest: Effort normal and breath sounds normal.  Abdominal: Soft. Bowel sounds are normal.  Musculoskeletal: Normal range of motion.  Neurological: She is alert and oriented to person, place, and time.  Skin: Skin is warm and dry.  Psychiatric: She has a normal mood and affect.          Assessment & Plan:  Susan Esparza was seen today for no specified reason.  Diagnoses and  associated orders for this visit:  ADD (attention deficit disorder)  Generalized anxiety disorder    encouraged healthy diet, exercise. Continue current medications. Follow-up in 3 months and sooner as needed. She has 2 current prescriptions were Vyvanse. Will need one additional prescription closer to February, prior to her next office visit.

## 2014-03-04 NOTE — Patient Instructions (Addendum)
Exercise to Stay Healthy Exercise helps you become and stay healthy. EXERCISE IDEAS AND TIPS Choose exercises that:  You enjoy.  Fit into your day. You do not need to exercise really hard to be healthy. You can do exercises at a slow or medium level and stay healthy. You can:  Stretch before and after working out.  Try yoga, Pilates, or tai chi.  Lift weights.  Walk fast, swim, jog, run, climb stairs, bicycle, dance, or rollerskate.  Take aerobic classes. Exercises that burn about 150 calories:  Running 1  miles in 15 minutes.  Playing volleyball for 45 to 60 minutes.  Washing and waxing a car for 45 to 60 minutes.  Playing touch football for 45 minutes.  Walking 1  miles in 35 minutes.  Pushing a stroller 1  miles in 30 minutes.  Playing basketball for 30 minutes.  Raking leaves for 30 minutes.  Bicycling 5 miles in 30 minutes.  Walking 2 miles in 30 minutes.  Dancing for 30 minutes.  Shoveling snow for 15 minutes.  Swimming laps for 20 minutes.  Walking up stairs for 15 minutes.  Bicycling 4 miles in 15 minutes.  Gardening for 30 to 45 minutes.  Jumping rope for 15 minutes.  Washing windows or floors for 45 to 60 minutes. Document Released: 04/24/2010 Document Revised: 06/14/2011 Document Reviewed: 04/24/2010 [employer] Patient Information 2015 [employer]. This information is not intended to replace advice given to you by your health care provider. Make sure you discuss any questions you have with your health care provider.  

## 2014-03-26 ENCOUNTER — Other Ambulatory Visit: Payer: Self-pay | Admitting: Family

## 2014-03-26 ENCOUNTER — Encounter: Payer: Self-pay | Admitting: Family

## 2014-03-26 ENCOUNTER — Other Ambulatory Visit: Payer: Self-pay | Admitting: *Deleted

## 2014-03-26 MED ORDER — FLUOXETINE HCL 20 MG PO TABS: 20.0000 mg | ORAL_TABLET | Freq: Every day | ORAL | Status: DC

## 2014-03-26 MED ORDER — DICLOFENAC POTASSIUM 50 MG PO TABS: ORAL_TABLET | ORAL | Status: DC

## 2014-05-08 ENCOUNTER — Encounter: Payer: Self-pay | Admitting: Obstetrics & Gynecology

## 2014-05-08 ENCOUNTER — Encounter: Payer: Self-pay | Admitting: Family

## 2014-06-03 ENCOUNTER — Ambulatory Visit: Payer: No Typology Code available for payment source | Admitting: Family

## 2014-06-12 ENCOUNTER — Ambulatory Visit: Payer: No Typology Code available for payment source | Admitting: Family

## 2014-06-13 ENCOUNTER — Ambulatory Visit: Payer: No Typology Code available for payment source | Admitting: Family

## 2014-06-14 ENCOUNTER — Ambulatory Visit (INDEPENDENT_AMBULATORY_CARE_PROVIDER_SITE_OTHER)
Admission: RE | Admit: 2014-06-14 | Discharge: 2014-06-14 | Disposition: A | Discharge status: Home / Self Care | Payer: PRIVATE HEALTH INSURANCE | Source: Ambulatory Visit | Attending: Family | Admitting: Family

## 2014-06-14 ENCOUNTER — Ambulatory Visit (INDEPENDENT_AMBULATORY_CARE_PROVIDER_SITE_OTHER): Payer: PRIVATE HEALTH INSURANCE | Admitting: Family

## 2014-06-14 ENCOUNTER — Encounter: Payer: Self-pay | Admitting: Family

## 2014-06-14 VITALS — BP 110/80 | Temp 98.2°F | Wt 184.1 lb

## 2014-06-14 DIAGNOSIS — F988 Other specified behavioral and emotional disorders with onset usually occurring in childhood and adolescence: Secondary | ICD-10-CM

## 2014-06-14 DIAGNOSIS — M25561 Pain in right knee: Secondary | ICD-10-CM

## 2014-06-14 DIAGNOSIS — F909 Attention-deficit hyperactivity disorder, unspecified type: Secondary | ICD-10-CM

## 2014-06-14 DIAGNOSIS — F411 Generalized anxiety disorder: Secondary | ICD-10-CM

## 2014-06-14 LAB — COMPREHENSIVE METABOLIC PANEL
ALBUMIN: 4.7 g/dL (ref 3.5–5.2)
ALK PHOS: 72 U/L (ref 39–117)
ALT: 38 U/L — ABNORMAL HIGH (ref 0–35)
AST: 39 U/L — ABNORMAL HIGH (ref 0–37)
BUN: 16 mg/dL (ref 6–23)
CO2: 31 mEq/L (ref 19–32)
Calcium: 10 mg/dL (ref 8.4–10.5)
Chloride: 103 mEq/L (ref 96–112)
Creatinine, Ser: 0.78 mg/dL (ref 0.40–1.20)
GFR: 80.56 mL/min (ref >60.00)
Glucose, Bld: 88 mg/dL (ref 70–99)
POTASSIUM: 5.1 meq/L (ref 3.5–5.1)
SODIUM: 138 meq/L (ref 135–145)
TOTAL PROTEIN: 7.6 g/dL (ref 6.0–8.3)
Total Bilirubin: 0.6 mg/dL (ref 0.2–1.2)

## 2014-06-14 LAB — CBC WITH DIFFERENTIAL/PLATELET
Basophils Absolute: 0 10*3/uL (ref 0.0–0.1)
Basophils Relative: 0.6 % (ref 0.0–3.0)
EOS PCT: 3 % (ref 0.0–5.0)
Eosinophils Absolute: 0.1 10*3/uL (ref 0.0–0.7)
HEMATOCRIT: 40.8 % (ref 36.0–46.0)
Hemoglobin: 14.2 g/dL (ref 12.0–15.0)
LYMPHS ABS: 1.2 10*3/uL (ref 0.7–4.0)
Lymphocytes Relative: 26 % (ref 12.0–46.0)
MCHC: 34.8 g/dL (ref 30.0–36.0)
MCV: 87.4 fl (ref 78.0–100.0)
Monocytes Absolute: 0.4 10*3/uL (ref 0.1–1.0)
Monocytes Relative: 8.2 % (ref 3.0–12.0)
Neutro Abs: 2.9 10*3/uL (ref 1.4–7.7)
Neutrophils Relative %: 62.2 % (ref 43.0–77.0)
Platelets: 211 10*3/uL (ref 150.0–400.0)
RBC: 4.67 Mil/uL (ref 3.87–5.11)
RDW: 13.4 % (ref 11.5–15.5)
WBC: 4.7 10*3/uL (ref 4.0–10.5)

## 2014-06-14 LAB — RHEUMATOID FACTOR

## 2014-06-14 MED ORDER — LISDEXAMFETAMINE DIMESYLATE 30 MG PO CAPS
30.0000 mg | ORAL_CAPSULE | Freq: Every day | ORAL | Status: DC
Start: 2014-06-14 — End: 2014-09-18

## 2014-06-14 MED ORDER — LORAZEPAM 0.5 MG PO TABS: 0.5000 mg | ORAL_TABLET | Freq: Every day | ORAL | Status: DC | PRN

## 2014-06-14 MED ORDER — LISDEXAMFETAMINE DIMESYLATE 30 MG PO CAPS: 30.0000 mg | ORAL_CAPSULE | Freq: Every day | ORAL | Status: DC

## 2014-06-14 NOTE — Progress Notes (Signed)
Subjective:    Patient ID: Susan Esparza, female    DOB: 1956-01-08, 59 y.o.   MRN: [id number]  HPI 59 year old white female, nonsmoker, with a history of osteoarthritis, attention deficit disorder, anxiety. Reports doing well. Does have concerns of worsening right knee pain. Has a history of osteoarthritis. Pain comes and goes. Reports it being a 9 out of 10 at its worse. At bedtime, typically about a 4 out of 10. Has noticed more swelling. Originally told that she needed a knee replacement approximately 8 years ago. Taking diclofenac that works okay. Stable on Vyvanse for ADD. Requesting a refill.   Review of Systems  Constitutional: Negative.   HENT: Negative.   Respiratory: Negative.   Cardiovascular: Negative.   Gastrointestinal: Negative.   Endocrine: Negative.   Genitourinary: Negative.   Musculoskeletal: Positive for arthralgias.       Right knee pain   Skin: Negative.   Allergic/Immunologic: Negative.   Neurological: Negative.   Psychiatric/Behavioral: Negative.    Past Medical History  Diagnosis Date  . Allergic rhinitis   . Depression   . Acute meniscal tear of knee   . Ocular rosacea   . DVT (deep vein thrombosis) in pregnancy 2009    left leg after knee Arthroscopy  . DVT (deep venous thrombosis) 05/2007    Lupus anticoagulant +  . Nasal polyps   . Anxiety     History   Social History  . Marital Status: Married    Spouse Name: N/A  . Number of Children: N/A  . Years of Education: N/A   Occupational History  . Not on file.   Social History Main Topics  . Smoking status: Never Smoker   . Smokeless tobacco: Never Used  . Alcohol Use: 2.5 oz/week    5 drink(s) per week  . Drug Use: No  . Sexual Activity:    Partners: Male    Birth Control/ Protection: Other-see comments     Comment: vasectomy   Other Topics Concern  . Not on file   Social History Narrative   ** Merged History Encounter **        Past Surgical History  Procedure  Laterality Date  . Knee arthroscopy Right 1989  . Knee arthroscopy Left 2009  . Facial cosmetic surgery  2006  . Nasal polyp surgery  01/2008         Family History  Problem Relation Age of Onset  . Hypertension Mother   . Osteoarthritis Mother   . Cancer Father     colon cancer  . Heart disease Father     tacycardia  . Diabetes Maternal Grandfather     Allergies  Allergen Reactions  . Bupropion Hcl     REACTION: hives  . Penicillins     Current Outpatient Prescriptions on File Prior to Visit  Medication Sig Dispense Refill  . diclofenac (CATAFLAM) 50 MG tablet TAKE ONE TABLET BY MOUTH TWICE DAILY 60 tablet 3  . FLUoxetine (PROZAC) 20 MG tablet Take 1 tablet (20 mg total) by mouth daily. 30 tablet 5   No current facility-administered medications on file prior to visit.    BP 110/80 mmHg  Temp(Src) 98.2 F (36.8 C) (Oral)  Wt 184 lb 1.6 oz (83.507 kg)  LMP 10/01/2009chart    Objective:   Physical Exam  Constitutional: She is oriented to person, place, and time. She appears well-developed and well-nourished.  HENT:  Right Ear: External ear normal.  Left Ear: External ear normal.  Nose: Nose normal.  Mouth/Throat: Oropharynx is clear and moist.  Neck: Normal range of motion. Neck supple.  Cardiovascular: Normal rate, regular rhythm and normal heart sounds.   Pulmonary/Chest: Effort normal and breath sounds normal.  Abdominal: Soft. Bowel sounds are normal.  Musculoskeletal: Normal range of motion.  Neurological: She is alert and oriented to person, place, and time.  Skin: Skin is warm and dry.  Psychiatric: She has a normal mood and affect.          Assessment & Plan:  Susan Esparza was seen today for 3 month follow up.  Diagnoses and all orders for this visit:  Right knee pain Orders: -     DG Knee Complete 4 Views Right; Future -     Rheumatoid Factor -     CMP -     CBC with Differential  Generalized anxiety disorder Orders: -     Rheumatoid  Factor -     CMP -     CBC with Differential  ADD (attention deficit disorder) Orders: -     Rheumatoid Factor -     CMP -     CBC with Differential  Other orders -     LORazepam (ATIVAN) 0.5 MG tablet; Take 1 tablet (0.5 mg total) by mouth daily as needed. -     Discontinue: lisdexamfetamine (VYVANSE) 30 MG capsule; Take 1 capsule (30 mg total) by mouth daily. -     Discontinue: lisdexamfetamine (VYVANSE) 30 MG capsule; Take 1 capsule (30 mg total) by mouth daily. -     lisdexamfetamine (VYVANSE) 30 MG capsule; Take 1 capsule (30 mg total) by mouth daily.   Refer to Dr. Gardenia Phlegm for further management. X-ray obtained. Will notify patient pending results. Prescriptions renewed. Call the office with any questions or concerns. Recheck in 3 months and sooner as needed.

## 2014-06-14 NOTE — Patient Instructions (Signed)

## 2014-06-14 NOTE — Progress Notes (Signed)
Pre visit review using our clinic review tool, if applicable. No additional management support is needed unless otherwise documented below in the visit note. 

## 2014-06-16 ENCOUNTER — Other Ambulatory Visit: Payer: Self-pay | Admitting: Family

## 2014-06-16 DIAGNOSIS — M1711 Unilateral primary osteoarthritis, right knee: Secondary | ICD-10-CM

## 2014-06-19 ENCOUNTER — Other Ambulatory Visit: Payer: Self-pay | Admitting: Family

## 2014-06-26 ENCOUNTER — Encounter: Payer: Self-pay | Admitting: Family Medicine

## 2014-06-26 ENCOUNTER — Other Ambulatory Visit (INDEPENDENT_AMBULATORY_CARE_PROVIDER_SITE_OTHER): Payer: PRIVATE HEALTH INSURANCE

## 2014-06-26 ENCOUNTER — Ambulatory Visit (INDEPENDENT_AMBULATORY_CARE_PROVIDER_SITE_OTHER): Payer: PRIVATE HEALTH INSURANCE | Admitting: Family Medicine

## 2014-06-26 VITALS — BP 122/80 | HR 55 | Ht 64.5 in | Wt 186.0 lb

## 2014-06-26 DIAGNOSIS — M1990 Unspecified osteoarthritis, unspecified site: Secondary | ICD-10-CM

## 2014-06-26 DIAGNOSIS — M1991 Primary osteoarthritis, unspecified site: Secondary | ICD-10-CM

## 2014-06-26 DIAGNOSIS — M25561 Pain in right knee: Secondary | ICD-10-CM

## 2014-06-26 NOTE — Progress Notes (Signed)
[name] [address] [place], [place] [address] Phone: [phone] Subjective:    I'm seeing this patient by the request  of:  CAMPBELL, PADONDA BOYD, FNP   CC: Knee pain, right  [id number] Susan Esparza is a 59 y.o. female coming in with complaint of right knee pain.  Patient has had known arthritis in this knee previously but is having worsening symptoms. Patient states that the pain is stopping her from daily activities. Patient states that sometimes she can feeling that she has some instability of the knee. States that the severity as 9 out of 10. Can seem to wake her up out of sleep.      Past medical history, social, surgical and family history all reviewed in electronic medical record.   X-rays patient right knee 06/14/2014 shows patient does have severe osteophytic changes most prominent of the patellofemoral and lateral compartments. Patient also had a prominent joint effusion.  Review of Systems: No headache, visual changes, nausea, vomiting, diarrhea, constipation, dizziness, abdominal pain, skin rash, fevers, chills, night sweats, weight loss, swollen lymph nodes, body aches, joint swelling, muscle aches, chest pain, shortness of breath, mood changes.   Objective Blood pressure 122/80, pulse 55, height 5' 4.5" (1.638 m), weight 186 lb (84.369 kg), last menstrual period 01/04/2008, SpO2 98 %.  General: No apparent distress alert and oriented x3 mood and affect normal, dressed appropriately.  HEENT: Pupils equal, extraocular movements intact  Respiratory: Patient's speak in full sentences and does not appear short of breath  Cardiovascular: No lower extremity edema, non tender, no erythema  Skin: Warm dry intact with no signs of infection or rash on extremities or on axial skeleton.  Abdomen: Soft nontender  Neuro: Cranial nerves II through XII are intact, neurovascularly intact in all extremities with 2+ DTRs and 2+ pulses.  Lymph: No  lymphadenopathy of posterior or anterior cervical chain or axillae bilaterally.  Gait normal with good balance and coordination.  MSK:  Non tender with full range of motion and good stability and symmetric strength and tone of shoulders, elbows, wrist, hip, and ankles bilaterally.  Foot exam shows the patient does have some mild oversupination of the hindfoot bilaterally causing more lateral aspect of her knee. Mild bunion and bunionette formation bilaterally. Knee: Right Normal to inspection with no erythema or effusion or obvious bony abnormalities. Tender to palpation over the lateral joint line ROM full in flexion and extension and lower leg rotation. Patient does have some laxity of the anterior cruciate ligament compared to the contralateral side Negative Mcmurray's, Apley's, and Thessalonian tests. Non painful patellar compression. Patellar glide with moderate crepitus. Patellar and quadriceps tendons unremarkable. Hamstring and quadriceps strength is normal.   Procedure: Real-time Ultrasound Guided Injection of right knee Device: GE Logiq E  Ultrasound guided injection is preferred based studies that show increased duration, increased effect, greater accuracy, decreased procedural pain, increased response rate, and decreased cost with ultrasound guided versus blind injection.  Verbal informed consent obtained.  Time-out conducted.  Noted no overlying erythema, induration, or other signs of local infection.  Skin prepped in a sterile fashion.  Local anesthesia: Topical Ethyl chloride.  With sterile technique and under real time ultrasound guidance: With a 22-gauge 2 inch needle patient was injected with 4 cc of 0.5% Marcaine and 1 cc of Kenalog 40 mg/dL. This was from a superior lateral approach.  Completed without difficulty  Pain immediately resolved suggesting accurate placement of the medication.  Advised to call  if fevers/chills, erythema, induration, drainage, or persistent  bleeding.  Images permanently stored and available for review in the ultrasound unit.  Impression: Technically successful ultrasound guided injection.   Impression and Recommendations:     This case required medical decision making of moderate complexity.

## 2014-06-26 NOTE — Progress Notes (Signed)
Pre visit review using our clinic review tool, if applicable. No additional management support is needed unless otherwise documented below in the visit note. 

## 2014-06-26 NOTE — Assessment & Plan Note (Signed)
She was given an injection today and will take some over-the-counter medications. Patient was given some topical anti-inflammatory that I think will be beneficial as well. We discussed bracing and patient was given a lateral unloader brace with patient's arthritis be more on the lateral aspect. This is secondary to her anterior cruciate ligament deficient knee. We discussed proper shoes and patient will also get into custom orthotics. Patient and will come back and see me again in 3-4 weeks for further evaluation and treatment.

## 2014-06-26 NOTE — Patient Instructions (Signed)
Good to see you Ice 20 minutes 2 times daily. Usually after activity and before bed. Exercises 3 times a week.  Take tylenol 650 mg three times a day is the best evidence based medicine we have for arthritis.  Glucosamine sulfate 1500mg  twice a day is a supplement that has been shown to help moderate to severe arthritis. Vitamin D 2000 IU daily Fish oil 2 grams daily.  Tumeric 500mg  twice daily.  Capsaicin topically up to four times a day may also help with pain. Cortisone injections are an option if these interventions do not seem to make a difference or need more relief.  If cortisone injections do not help, there are different types of shots that may help but they take longer to take effect.  We can discuss this at follow up.  It's important that you continue to stay active. Shoe inserts with good arch support may be helpful.  [employer] orthotics at [employer] [facility] could help.  Water aerobics and cycling with low resistance are the best two types of exercise for arthritis. Come back and see me in 3 weeks.

## 2014-06-27 ENCOUNTER — Telehealth: Payer: Self-pay | Admitting: Family

## 2014-06-27 NOTE — Telephone Encounter (Signed)
lmovm for pt to return call.  

## 2014-06-27 NOTE — Telephone Encounter (Signed)
Pt called in and would like a call back from nurse about the meds she is suppose to be taking.  She is not sure which ones she should be mixing?    Best number [phone]

## 2014-06-27 NOTE — Telephone Encounter (Signed)
Discussed with pt that she could take voltaren & tylenol together.

## 2014-07-11 ENCOUNTER — Ambulatory Visit (INDEPENDENT_AMBULATORY_CARE_PROVIDER_SITE_OTHER): Payer: PRIVATE HEALTH INSURANCE | Admitting: Family Medicine

## 2014-07-11 DIAGNOSIS — M1991 Primary osteoarthritis, unspecified site: Secondary | ICD-10-CM

## 2014-07-11 DIAGNOSIS — M1990 Unspecified osteoarthritis, unspecified site: Secondary | ICD-10-CM

## 2014-07-11 NOTE — Patient Instructions (Signed)

## 2014-07-11 NOTE — Progress Notes (Signed)
Patient was fitted for a : standard, cushioned, semi-rigid orthotic. The orthotic was heated and afterward the patient was in a seated position and the orthotic molded. The patient was positioned in subtalar neutral position and 10 degrees of ankle dorsiflexion in a non-weight bearing stance. After completion of molding, patient did have orthotic management which included instructions on acclimating to the orthotics, signs of ill fit as well as care for the orthotic. I spent 30 minutes with the patient discussing the orthotic, fit, and proper care of the orthotics, as well as general footwear and foot strike patterns.   The blank was ground to a stable position for weight bearing. Size: 8 (Igli Alround) Base: Carbon fiber Additional Posting and Padding: The following postings were fitted onto the molded orthotics to help maintain a talar neutral position - Wedge posting for transverse arch: none       Silicone posting for longitudinal arch:  250/100 and 250/70 bilaterally  The patient ambulated these, and they were very comfortable and supportive. The patient has been wearing a softer orthotic so we discussed the differences in wear patterns and what to expect with moving forward with a slightly more rigid orthotic.

## 2014-07-12 ENCOUNTER — Encounter: Payer: Self-pay | Admitting: Family Medicine

## 2014-07-12 NOTE — Assessment & Plan Note (Signed)
Orthotics made, discussed icing and wear over time, see patient instructions.

## 2014-07-16 ENCOUNTER — Ambulatory Visit: Payer: PRIVATE HEALTH INSURANCE | Admitting: Family Medicine

## 2014-07-17 ENCOUNTER — Encounter: Payer: Self-pay | Admitting: Family Medicine

## 2014-07-17 ENCOUNTER — Ambulatory Visit (INDEPENDENT_AMBULATORY_CARE_PROVIDER_SITE_OTHER): Payer: PRIVATE HEALTH INSURANCE | Admitting: Family Medicine

## 2014-07-17 VITALS — BP 124/72 | HR 63 | Wt 187.0 lb

## 2014-07-17 DIAGNOSIS — M1990 Unspecified osteoarthritis, unspecified site: Secondary | ICD-10-CM | POA: Diagnosis not present

## 2014-07-17 DIAGNOSIS — M1991 Primary osteoarthritis, unspecified site: Secondary | ICD-10-CM

## 2014-07-17 NOTE — Progress Notes (Signed)
  Susan Esparza,  [id number] Phone: [phone] Subjective:    I'm seeing this patient by the request  of:  CAMPBELL, PADONDA BOYD, FNP   CC: Knee pain, right  [id number] Susan Esparza is a 59 y.o. female coming in with complaint of right knee pain.  Patient has had known arthritis in this knee.  Patient actually has severe osteophytic changes of the knees. Patient was putting custom orthotics, icing protocol, discussed bracing, over-the-counter medications and topical anti-inflammatories. Patient was also given a corticosteroid injection at last exam. Patient states the injection did help for approximately 2 weeks and then the pain started to return. Patient is still had 2 different times when patient knee has given out on her. Patient is concern that this will become more frequent. States that the severe pain though is better since the injection.     Past medical history, social, surgical and family history all reviewed in electronic medical record.   X-rays patient right knee 06/14/2014 shows patient does have severe osteophytic changes most prominent of the patellofemoral and lateral compartments. Patient also had a prominent joint effusion.  Review of Systems: No headache, visual changes, nausea, vomiting, diarrhea, constipation, dizziness, abdominal pain, skin rash, fevers, chills, night sweats, weight loss, swollen lymph nodes, body aches, joint swelling, muscle aches, chest pain, shortness of breath, mood changes.   Objective Blood pressure 124/72, pulse 63, weight 187 lb (84.823 kg), last menstrual period 01/04/2008, SpO2 98 %.  General: No apparent distress alert and oriented x3 mood and affect normal, dressed appropriately.  HEENT: Pupils equal, extraocular movements intact  Respiratory: Patient's speak in full sentences and does not appear short of breath  Cardiovascular: No lower extremity edema, non tender, no erythema   Skin: Warm dry intact with no signs of infection or rash on extremities or on axial skeleton.  Abdomen: Soft nontender  Neuro: Cranial nerves II through XII are intact, neurovascularly intact in all extremities with 2+ DTRs and 2+ pulses.  Lymph: No lymphadenopathy of posterior or anterior cervical chain or axillae bilaterally.  Gait normal with good balance and coordination.  MSK:  Non tender with full range of motion and good stability and symmetric strength and tone of shoulders, elbows, wrist, hip, and ankles bilaterally.  Foot exam shows the patient does have some mild oversupination of the hindfoot bilaterally causing more lateral aspect of her knee. Mild bunion and bunionette formation bilaterally. Knee: Right Normal to inspection with no erythema or effusion or obvious bony abnormalities. Mild to moderate tenderness same as before.  ROM full in flexion and extension and lower leg rotation. Patient does have some laxity of the anterior cruciate ligament compared to the contralateral side Negative Mcmurray's, Apley's, and Thessalonian tests. Non painful patellar compression. Patellar glide with moderate crepitus. Patellar and quadriceps tendons unremarkable. Hamstring and quadriceps strength is normal.       Impression and Recommendations:     This case required medical decision making of moderate complexity.

## 2014-07-17 NOTE — Assessment & Plan Note (Signed)
Patient did respond somewhat to the injection but still has some instability of the knee. Patient does have some moderate meniscal degenerative change but I do not think that this is likely contributing. Patient's severe osteophytic changes is likely was given her the pain. We discussed different treatment options and at this moment patient would like to discuss with an orthopedic surgeon different options. Patient is been taking she would consider a knee replacement in this is a good time to have it done. Patient has been referred and can follow-up with me if she has any questions.  Spent  25 minutes with patient face-to-face and had greater than 50% of counseling including as described above in assessment and plan.

## 2014-07-17 NOTE — Patient Instructions (Addendum)
Good to see you So lets get the knee done.  Dr. Rhona Raider at [facility] [phone] Continue the brace and icing. Ask me any questions you need.  See me when you need me.

## 2014-07-17 NOTE — Progress Notes (Signed)
Pre visit review using our clinic review tool, if applicable. No additional management support is needed unless otherwise documented below in the visit note. 

## 2014-07-30 ENCOUNTER — Encounter: Payer: Self-pay | Admitting: Family Medicine

## 2014-07-30 DIAGNOSIS — M1991 Primary osteoarthritis, unspecified site: Secondary | ICD-10-CM

## 2014-08-18 ENCOUNTER — Other Ambulatory Visit: Payer: Self-pay | Admitting: Family

## 2014-09-11 ENCOUNTER — Other Ambulatory Visit: Payer: Self-pay | Admitting: Orthopaedic Surgery

## 2014-09-11 ENCOUNTER — Encounter: Payer: Self-pay | Admitting: Internal Medicine

## 2014-09-17 ENCOUNTER — Other Ambulatory Visit: Payer: Self-pay | Admitting: Family

## 2014-09-18 ENCOUNTER — Ambulatory Visit (INDEPENDENT_AMBULATORY_CARE_PROVIDER_SITE_OTHER): Payer: PRIVATE HEALTH INSURANCE | Admitting: Internal Medicine

## 2014-09-18 ENCOUNTER — Encounter: Payer: Self-pay | Admitting: Internal Medicine

## 2014-09-18 VITALS — BP 124/78 | HR 86 | Temp 98.6°F | Resp 14 | Ht 66.0 in | Wt 186.0 lb

## 2014-09-18 DIAGNOSIS — Z0000 Encounter for general adult medical examination without abnormal findings: Secondary | ICD-10-CM

## 2014-09-18 DIAGNOSIS — Z23 Encounter for immunization: Secondary | ICD-10-CM

## 2014-09-18 DIAGNOSIS — F988 Other specified behavioral and emotional disorders with onset usually occurring in childhood and adolescence: Secondary | ICD-10-CM

## 2014-09-18 MED ORDER — LISDEXAMFETAMINE DIMESYLATE 30 MG PO CAPS: 30.0000 mg | ORAL_CAPSULE | Freq: Every day | ORAL | Status: DC

## 2014-09-18 MED ORDER — LISDEXAMFETAMINE DIMESYLATE 30 MG PO CAPS
30.0000 mg | ORAL_CAPSULE | Freq: Every day | ORAL | Status: DC
Start: 2014-09-18 — End: 2014-09-18

## 2014-09-18 NOTE — Assessment & Plan Note (Signed)
Up to date on colonoscopy and mammogram. Due for repeat colonoscopy Nov 2017. Tdap given today as last tetanus did not include pertussis protection and she is expecting a new grandchild soon. Talked to her about sun safety and decreasing risk of skin changes and cancer.

## 2014-09-18 NOTE — Patient Instructions (Addendum)
We will give you the 3 month supply of the vyvanse. You do not need any labs today.   We have given you the tetanus shot today with the whooping cough (also called pertussis) protection.   Come back in about 6 months to check in.   Health Maintenance Adopting a healthy lifestyle and getting preventive care can go a long way to promote health and wellness. Talk with your health care provider about what schedule of regular examinations is right for you. This is a good chance for you to check in with your provider about disease prevention and staying healthy. In between checkups, there are plenty of things you can do on your own. Experts have done a lot of research about which lifestyle changes and preventive measures are most likely to keep you healthy. Ask your health care provider for more information. WEIGHT AND DIET  Eat a healthy diet  Be sure to include plenty of vegetables, fruits, low-fat dairy products, and lean protein.  Do not eat a lot of foods high in solid fats, added sugars, or salt.  Get regular exercise. This is one of the most important things you can do for your health.  Most adults should exercise for at least 150 minutes each week. The exercise should increase your heart rate and make you sweat (moderate-intensity exercise).  Most adults should also do strengthening exercises at least twice a week. This is in addition to the moderate-intensity exercise.  Maintain a healthy weight  Body mass index (BMI) is a measurement that can be used to identify possible weight problems. It estimates body fat based on height and weight. Your health care provider can help determine your BMI and help you achieve or maintain a healthy weight.  For females 64 years of age and older:   A BMI below 18.5 is considered underweight.  A BMI of 18.5 to 24.9 is normal.  A BMI of 25 to 29.9 is considered overweight.  A BMI of 30 and above is considered obese.  Watch levels of cholesterol  and blood lipids  You should start having your blood tested for lipids and cholesterol at 59 years of age, then have this test every 5 years.  You may need to have your cholesterol levels checked more often if:  Your lipid or cholesterol levels are high.  You are older than 59 years of age.  You are at high risk for heart disease.  CANCER SCREENING   Lung Cancer  Lung cancer screening is recommended for adults 66-35 years old who are at high risk for lung cancer because of a history of smoking.  A yearly low-dose CT scan of the lungs is recommended for people who:  Currently smoke.  Have quit within the past 15 years.  Have at least a 30-pack-year history of smoking. A pack year is smoking an average of one pack of cigarettes a day for 1 year.  Yearly screening should continue until it has been 15 years since you quit.  Yearly screening should stop if you develop a health problem that would prevent you from having lung cancer treatment.  Breast Cancer  Practice breast self-awareness. This means understanding how your breasts normally appear and feel.  It also means doing regular breast self-exams. Let your health care provider know about any changes, no matter how small.  If you are in your 20s or 30s, you should have a clinical breast exam (CBE) by a health care provider every 1-3 years as part  of a regular health exam.  If you are 43 or older, have a CBE every year. Also consider having a breast X-ray (mammogram) every year.  If you have a family history of breast cancer, talk to your health care provider about genetic screening.  If you are at high risk for breast cancer, talk to your health care provider about having an MRI and a mammogram every year.  Breast cancer gene (BRCA) assessment is recommended for women who have family members with BRCA-related cancers. BRCA-related cancers include:  Breast.  Ovarian.  Tubal.  Peritoneal cancers.  Results of the  assessment will determine the need for genetic counseling and BRCA1 and BRCA2 testing. Cervical Cancer Routine pelvic examinations to screen for cervical cancer are no longer recommended for nonpregnant women who are considered low risk for cancer of the pelvic organs (ovaries, uterus, and vagina) and who do not have symptoms. A pelvic examination may be necessary if you have symptoms including those associated with pelvic infections. Ask your health care provider if a screening pelvic exam is right for you.   The Pap test is the screening test for cervical cancer for women who are considered at risk.  If you had a hysterectomy for a problem that was not cancer or a condition that could lead to cancer, then you no longer need Pap tests.  If you are older than 65 years, and you have had normal Pap tests for the past 10 years, you no longer need to have Pap tests.  If you have had past treatment for cervical cancer or a condition that could lead to cancer, you need Pap tests and screening for cancer for at least 20 years after your treatment.  If you no longer get a Pap test, assess your risk factors if they change (such as having a new sexual partner). This can affect whether you should start being screened again.  Some women have medical problems that increase their chance of getting cervical cancer. If this is the case for you, your health care provider may recommend more frequent screening and Pap tests.  The human papillomavirus (HPV) test is another test that may be used for cervical cancer screening. The HPV test looks for the virus that can cause cell changes in the cervix. The cells collected during the Pap test can be tested for HPV.  The HPV test can be used to screen women 105 years of age and older. Getting tested for HPV can extend the interval between normal Pap tests from three to five years.  An HPV test also should be used to screen women of any age who have unclear Pap test  results.  After 59 years of age, women should have HPV testing as often as Pap tests.  Colorectal Cancer  This type of cancer can be detected and often prevented.  Routine colorectal cancer screening usually begins at 59 years of age and continues through 59 years of age.  Your health care provider may recommend screening at an earlier age if you have risk factors for colon cancer.  Your health care provider may also recommend using home test kits to check for hidden blood in the stool.  A small camera at the end of a tube can be used to examine your colon directly (sigmoidoscopy or colonoscopy). This is done to check for the earliest forms of colorectal cancer.  Routine screening usually begins at age 1.  Direct examination of the colon should be repeated every 5-10 years  through 59 years of age. However, you may need to be screened more often if early forms of precancerous polyps or small growths are found. Skin Cancer  Check your skin from head to toe regularly.  Tell your health care provider about any new moles or changes in moles, especially if there is a change in a mole's shape or color.  Also tell your health care provider if you have a mole that is larger than the size of a pencil eraser.  Always use sunscreen. Apply sunscreen liberally and repeatedly throughout the day.  Protect yourself by wearing long sleeves, pants, a wide-brimmed hat, and sunglasses whenever you are outside. HEART DISEASE, DIABETES, AND HIGH BLOOD PRESSURE   Have your blood pressure checked at least every 1-2 years. High blood pressure causes heart disease and increases the risk of stroke.  If you are between 84 years and 52 years old, ask your health care provider if you should take aspirin to prevent strokes.  Have regular diabetes screenings. This involves taking a blood sample to check your fasting blood sugar level.  If you are at a normal weight and have a low risk for diabetes, have this  test once every three years after 59 years of age.  If you are overweight and have a high risk for diabetes, consider being tested at a younger age or more often. PREVENTING INFECTION  Hepatitis B  If you have a higher risk for hepatitis B, you should be screened for this virus. You are considered at high risk for hepatitis B if:  You were born in a country where hepatitis B is common. Ask your health care provider which countries are considered high risk.  Your parents were born in a high-risk country, and you have not been immunized against hepatitis B (hepatitis B vaccine).  You have HIV or AIDS.  You use needles to inject street drugs.  You live with someone who has hepatitis B.  You have had sex with someone who has hepatitis B.  You get hemodialysis treatment.  You take certain medicines for conditions, including cancer, organ transplantation, and autoimmune conditions. Hepatitis C  Blood testing is recommended for:  Everyone born from [birth year] through 1965.  Anyone with known risk factors for hepatitis C. Sexually transmitted infections (STIs)  You should be screened for sexually transmitted infections (STIs) including gonorrhea and chlamydia if:  You are sexually active and are younger than 59 years of age.  You are older than 59 years of age and your health care provider tells you that you are at risk for this type of infection.  Your sexual activity has changed since you were last screened and you are at an increased risk for chlamydia or gonorrhea. Ask your health care provider if you are at risk.  If you do not have HIV, but are at risk, it may be recommended that you take a prescription medicine daily to prevent HIV infection. This is called pre-exposure prophylaxis (PrEP). You are considered at risk if:  You are sexually active and do not regularly use condoms or know the HIV status of your partner(s).  You take drugs by injection.  You are sexually active with  a partner who has HIV. Talk with your health care provider about whether you are at high risk of being infected with HIV. If you choose to begin PrEP, you should first be tested for HIV. You should then be tested every 3 months for as long as you are taking  PrEP.  PREGNANCY   If you are premenopausal and you may become pregnant, ask your health care provider about preconception counseling.  If you may become pregnant, take 400 to 800 micrograms (mcg) of folic acid every day.  If you want to prevent pregnancy, talk to your health care provider about birth control (contraception). OSTEOPOROSIS AND MENOPAUSE   Osteoporosis is a disease in which the bones lose minerals and strength with aging. This can result in serious bone fractures. Your risk for osteoporosis can be identified using a bone density scan.  If you are 57 years of age or older, or if you are at risk for osteoporosis and fractures, ask your health care provider if you should be screened.  Ask your health care provider whether you should take a calcium or vitamin D supplement to lower your risk for osteoporosis.  Menopause may have certain physical symptoms and risks.  Hormone replacement therapy may reduce some of these symptoms and risks. Talk to your health care provider about whether hormone replacement therapy is right for you.  HOME CARE INSTRUCTIONS   Schedule regular health, dental, and eye exams.  Stay current with your immunizations.   Do not use any tobacco products including cigarettes, chewing tobacco, or electronic cigarettes.  If you are pregnant, do not drink alcohol.  If you are breastfeeding, limit how much and how often you drink alcohol.  Limit alcohol intake to no more than 1 drink per day for nonpregnant women. One drink equals 12 ounces of beer, 5 ounces of wine, or 1 ounces of hard liquor.  Do not use street drugs.  Do not share needles.  Ask your health care provider for help if you need  support or information about quitting drugs.  Tell your health care provider if you often feel depressed.  Tell your health care provider if you have ever been abused or do not feel safe at home. Document Released: 10/05/2010 Document Revised: 08/06/2013 Document Reviewed: 02/21/2013 [facility] Patient Information 2015 [place], [place]. This information is not intended to replace advice given to you by your health care provider. Make sure you discuss any questions you have with your health care provider.

## 2014-09-18 NOTE — Progress Notes (Signed)
Pre visit review using our clinic review tool, if applicable. No additional management support is needed unless otherwise documented below in the visit note. 

## 2014-09-18 NOTE — Progress Notes (Signed)
   Subjective:    Patient ID: Susan Esparza, female    DOB: 01/29/1956, 59 y.o.   MRN: [id number]  HPI The patient is a 59 YO female who is coming in today for wellness. No new complaints. Is going to get a knee replacement soon due to arthritis and old ACL tear. Is going to have grandchild (1st) this fall.   PMH, [facility], social history reviewed and updated today.   Review of Systems  Constitutional: Positive for activity change. Negative for fever, appetite change and fatigue.  HENT: Negative.   Eyes: Negative.   Respiratory: Negative for cough, chest tightness, shortness of breath and wheezing.   Cardiovascular: Negative for chest pain, palpitations and leg swelling.  Gastrointestinal: Negative for abdominal pain, diarrhea, constipation, blood in stool and abdominal distention.  Musculoskeletal: Positive for arthralgias. Negative for myalgias, back pain and gait problem.  Skin: Negative.   Neurological: Negative.   Psychiatric/Behavioral: Negative.       Objective:   Physical Exam  Constitutional: She is oriented to person, place, and time. She appears well-developed and well-nourished.  HENT:  Head: Normocephalic and atraumatic.  Eyes: EOM are normal.  Neck: Normal range of motion.  Cardiovascular: Normal rate and regular rhythm.   Pulmonary/Chest: Effort normal and breath sounds normal. No respiratory distress. She has no wheezes. She has no rales.  Abdominal: Soft. Bowel sounds are normal. She exhibits no distension. There is no tenderness. There is no rebound.  Musculoskeletal: She exhibits no edema.  Neurological: She is alert and oriented to person, place, and time. Coordination normal.  Skin: Skin is warm and dry.  Psychiatric: She has a normal mood and affect.   Filed Vitals:   09/18/14 0807  BP: 124/78  Pulse: 86  Temp: 98.6 F (37 C)  TempSrc: Oral  Resp: 14  Height: 5\' 6"  (1.676 m)  Weight: 186 lb (84.369 kg)  SpO2: 98%      Assessment & Plan:  Tdap given  today.

## 2014-09-18 NOTE — Assessment & Plan Note (Signed)
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 Talked with her about the fact that we do not know impact of long term usage and its likely increase in CAD and stroke risk in adults especially given longer term usage. She agrees to these risks for the positive improvement on her life.

## 2014-09-24 ENCOUNTER — Other Ambulatory Visit: Payer: Self-pay | Admitting: Geriatric Medicine

## 2014-09-24 MED ORDER — DICLOFENAC POTASSIUM 50 MG PO TABS: 50.0000 mg | ORAL_TABLET | Freq: Two times a day (BID) | ORAL | Status: DC

## 2014-09-26 ENCOUNTER — Other Ambulatory Visit: Payer: Self-pay | Admitting: Family

## 2014-10-16 ENCOUNTER — Other Ambulatory Visit (HOSPITAL_COMMUNITY): Payer: PRIVATE HEALTH INSURANCE

## 2014-10-29 ENCOUNTER — Encounter (HOSPITAL_COMMUNITY): Admission: RE | Payer: Self-pay | Source: Ambulatory Visit

## 2014-10-29 ENCOUNTER — Inpatient Hospital Stay (HOSPITAL_COMMUNITY)
Admission: RE | Admit: 2014-10-29 | Payer: PRIVATE HEALTH INSURANCE | Source: Ambulatory Visit | Admitting: Orthopaedic Surgery

## 2014-10-29 SURGERY — ARTHROPLASTY, KNEE, TOTAL
Anesthesia: Spinal | Laterality: Right

## 2014-12-11 ENCOUNTER — Telehealth: Payer: Self-pay | Admitting: Obstetrics & Gynecology

## 2014-12-11 NOTE — Telephone Encounter (Signed)
Patient came by the office to drop off a recent image of her pelvis. She brought this for Dr. Sabra Heck to review and said, "They found a small fibroid." Routing report to Dr. Sabra Heck for review.

## 2014-12-13 NOTE — Telephone Encounter (Signed)
Call to patient.  Dr. Sabra Heck viewed report and hard copy chart. Patient with history of fibroids in 2012, Pelvic ultrasound in office.   Spoke with patient. She denies complaints. She states she is having knee surgery on 02/24/15 and xray was done of Hip to ensure nothing wrong with hip joints and noted fibroid with calcifications on xray.  Patient declines ultrasound at this time. Advised patient receiving radiology report would be best as all we have is picture of pelvis. Offered to schedule Pelvic ultrasound, patient declines as she is having no current problems.  Patient has annual exam with Dr. Sabra Heck scheduled for 01/28/15 at 1:00 and she confirms appointment. She will call back with any concerns prior to appointment.   Routing to provider for final review. Patient agreeable to disposition. Will close encounter.

## 2015-01-13 ENCOUNTER — Other Ambulatory Visit: Payer: Self-pay | Admitting: Family

## 2015-01-28 ENCOUNTER — Encounter: Payer: Self-pay | Admitting: Obstetrics & Gynecology

## 2015-01-28 ENCOUNTER — Ambulatory Visit (INDEPENDENT_AMBULATORY_CARE_PROVIDER_SITE_OTHER): Payer: PRIVATE HEALTH INSURANCE | Admitting: Obstetrics & Gynecology

## 2015-01-28 VITALS — BP 120/78 | HR 56 | Resp 16 | Ht 65.0 in | Wt 176.0 lb

## 2015-01-28 DIAGNOSIS — Z01419 Encounter for gynecological examination (general) (routine) without abnormal findings: Secondary | ICD-10-CM

## 2015-01-28 DIAGNOSIS — R899 Unspecified abnormal finding in specimens from other organs, systems and tissues: Secondary | ICD-10-CM

## 2015-01-28 DIAGNOSIS — R748 Abnormal levels of other serum enzymes: Secondary | ICD-10-CM

## 2015-01-28 DIAGNOSIS — Z0000 Encounter for general adult medical examination without abnormal findings: Secondary | ICD-10-CM | POA: Diagnosis not present

## 2015-01-28 LAB — HEPATIC FUNCTION PANEL
ALBUMIN: 4.5 g/dL (ref 3.6–5.1)
ALK PHOS: 57 U/L (ref 33–130)
ALT: 22 U/L (ref 6–29)
AST: 20 U/L (ref 10–35)
BILIRUBIN DIRECT: 0.1 mg/dL (ref <=0.2)
Indirect Bilirubin: 0.6 mg/dL (ref 0.2–1.2)
Total Bilirubin: 0.7 mg/dL (ref 0.2–1.2)
Total Protein: 6.8 g/dL (ref 6.1–8.1)

## 2015-01-28 LAB — POCT URINALYSIS DIPSTICK
Bilirubin, UA: NEGATIVE
Blood, UA: NEGATIVE
Glucose, UA: NEGATIVE
Nitrite, UA: NEGATIVE
Protein, UA: NEGATIVE
UROBILINOGEN UA: NEGATIVE
pH, UA: 5

## 2015-01-28 LAB — HEPATITIS C ANTIBODY: HCV Ab: NEGATIVE

## 2015-01-28 MED ORDER — ZOSTER VACCINE LIVE 19400 UNT/0.65ML ~~LOC~~ SOLR: 0.6500 mL | Freq: Once | SUBCUTANEOUS | Status: DC

## 2015-01-28 NOTE — Progress Notes (Addendum)
59 y.o. [id number] MarriedCaucasianF here for annual exam.  Doing well.  Going to [place] for trip with husband.  No vaginal bleeding.  Went to [place] earlier this year.     PCP:  Dr. Doug Sou.  [facility].  Liver enzymes were mildly elevated.  No follow up blood work has been done.    Patient's last menstrual period was 01/04/2008.          Sexually active: Yes.    The current method of family planning is vasectomy.    Exercising: Yes.    walking and yoga Smoker:  no  Health Maintenance: Pap:  12/03/13 WNL, neg pap with neg HR HPV 2013. History of abnormal Pap:  no MMG:  10/16 [facility]-per patient-normal Colonoscopy:  2012-repeat in 5 years Dr Collene Mares BMD:   Heel test years ago TDaP:  09/18/14 Screening Labs: liver enzymes today, Hb today: CBC 3/16, Urine today: WBC-trace, KETONES-trace   reports that she has never smoked. She has never used smokeless tobacco. She reports that she drinks about 2.5 oz of alcohol per week. She reports that she does not use illicit drugs.  Past Medical History  Diagnosis Date  . Allergic rhinitis   . Depression   . Acute meniscal tear of knee   . Ocular rosacea   . DVT (deep vein thrombosis) in pregnancy 2009    left leg after knee Arthroscopy  . 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 Nasal polyps   . Anxiety     Past Surgical History  Procedure Laterality Date  . Knee arthroscopy Right 1989  . Knee arthroscopy Left 2009  . Facial cosmetic surgery  2006  . Nasal polyp surgery  01/2008         Current Outpatient Prescriptions  Medication Sig Dispense Refill  . acetaminophen (TYLENOL) 325 MG tablet Take 650 mg by mouth every 6 (six) hours as needed.    . diclofenac (CATAFLAM) 50 MG tablet Take 1 tablet (50 mg total) by mouth 2 (two) times daily. 60 tablet 1  . FLUoxetine (PROZAC) 20 MG tablet TAKE ONE TABLET BY MOUTH ONE TIME DAILY 30 tablet 5  . LORazepam (ATIVAN) 0.5 MG tablet Take 1 tablet (0.5 mg total) by mouth daily  as needed. (Patient not taking: Reported on 01/28/2015) 30 tablet 0   No current facility-administered medications for this visit.    Family History  Problem Relation Age of Onset  . Hypertension Mother   . Osteoarthritis Mother   . Cancer Father     colon cancer  . Heart disease Father     tacycardia  . Diabetes Maternal Grandfather     ROS:  Pertinent items are noted in HPI.  Otherwise, a comprehensive ROS was negative.  Exam:   General appearance: alert, cooperative and appears stated age Head: Normocephalic, without obvious abnormality, atraumatic Neck: no adenopathy, supple, symmetrical, trachea midline and thyroid normal to inspection and palpation Lungs: clear to auscultation bilaterally Breasts: normal appearance, no masses or tenderness Heart: regular rate and rhythm Abdomen: soft, non-tender; bowel sounds normal; no masses,  no organomegaly Extremities: extremities normal, atraumatic, no cyanosis or edema Skin: Skin color, texture, turgor normal. No rashes or lesions Lymph nodes: Cervical, supraclavicular, and axillary nodes normal. No abnormal inguinal nodes palpated Neurologic: Grossly normal   Pelvic: External genitalia:  no lesions              Urethra:  normal appearing urethra with no masses, tenderness  or lesions              Bartholins and Skenes: normal                 Vagina: normal appearing vagina with normal color and discharge, no lesions              Cervix: no lesions              Pap taken: No. Bimanual Exam:  Uterus:  normal size, contour, position, consistency, mobility, non-tender              Adnexa: normal adnexa and no mass, fullness, tenderness               Rectovaginal: Confirms               Anus:  normal sphincter tone, no lesions  Chaperone was present for exam.  A:  Well Woman with normal exam  PMP, no HRT  H/O DVT post knee arthroscopy.  Upcoming knee replacement planned.  Will be on Xarelto post op. Vit D deficiency   Mildly elevated AST/ALT with labs in March Zostavax rx to pharmacy.  (Pt has money in [facility] that she wants to use for getting vaccinated.)  P: Mammogram yearly. Doing 3D MMG  pap smear with neg HR HPV 7/13. Pap neg 2015.  No pap today. Repeat liver enzymes today Hep C testing obtained as well return annually or prn

## 2015-01-28 NOTE — Addendum Note (Signed)
Addended by: Megan Salon on: 01/28/2015 02:23 PM   Modules accepted: Miquel Dunn

## 2015-01-30 ENCOUNTER — Telehealth: Payer: Self-pay | Admitting: Emergency Medicine

## 2015-01-30 ENCOUNTER — Encounter: Payer: Self-pay | Admitting: Obstetrics & Gynecology

## 2015-01-30 LAB — HEMOGLOBIN, FINGERSTICK: Hemoglobin, fingerstick: 13.3 g/dL (ref 12.0–16.0)

## 2015-01-30 NOTE — Telephone Encounter (Signed)
Dr. Sabra Heck,  Patient brought in xray of hip that per patient showed fibroid. Dr. Sabra Heck reviewed at that time 12/13/14. Patient was offered Pelvic ultrasound to evaluate and declined.  Okay to offer Pelvic ultrasound again in office for patient to evaluate?

## 2015-01-30 NOTE — Telephone Encounter (Signed)
Chief Complaint  Patient presents with  . Advice Only    Patient sent mychart message     ===View-only below this line===   ----- Message -----    FromCatarina Esparza    Sent: 01/30/2015  9:37 AM EDT      To: Lyman Speller, MD Subject: Visit Follow-Up Question  Doh!  I totally forgot to ask about the X-ray I brought in (about a month before my appointment this week) from [facility]... it showed a calcified fibroid that showed up when they were looking at my back.  Your office called after I dropped it off and said not to worry we would talk about it when I came in.  Only I forgot to ask about it.  I'm not having any symptoms so I'm guessing it is a wait and see kind of thing, but wanted to run it by you.  Hope Wednesday night went well and your little Fireman set trunk r treat ablaze.  In a good way... Susan Esparza

## 2015-01-30 NOTE — Telephone Encounter (Signed)
Telephone call for triage created to discuss message with patient and disposition as appropriate.   

## 2015-01-31 ENCOUNTER — Ambulatory Visit: Payer: Self-pay | Admitting: Orthopedic Surgery

## 2015-01-31 ENCOUNTER — Other Ambulatory Visit: Payer: Self-pay | Admitting: Obstetrics & Gynecology

## 2015-01-31 DIAGNOSIS — D251 Intramural leiomyoma of uterus: Secondary | ICD-10-CM

## 2015-01-31 NOTE — Telephone Encounter (Signed)
Please let her know that I did review it and it does look like a calcified fibroid which means it has been there for a long time.  She is not having any bleeding and her exam is normal. Typically if I see a finding of a new fibroid and it is not a really large one (this doesn't look that way), I repeat an ultrasound in 3-6 months to recheck and make sure it is not growing or changing.  So, that I what I think we should do.  X ray was done 12/04/14.  Repeat PUS would be appropriate late Nov-late Feb.  Order placed.  Thanks.

## 2015-01-31 NOTE — Telephone Encounter (Signed)
Return call to patient. Left message to call back to triage nurse.    

## 2015-01-31 NOTE — Progress Notes (Signed)
Preoperative surgical orders have been place into the Epic hospital system for [facility] on 01/31/2015, 4:54 PM  by Mickel Crow for surgery on 02-24-2015.  Preop Total Knee orders including Experal, IV Tylenol, and IV Decadron as long as there are no contraindications to the above medications. Arlee Muslim, PA-C

## 2015-02-11 NOTE — Telephone Encounter (Signed)
Patient contacted and discussed benefits with  Susan Esparza. Patient has upcoming knee surgery on 02/24/15 and new deductible calendar year starts for patient 03/06/15. She would like to plan prior to surgery so that she can come prior to surgery and restart of benefit year.  Scheduled Pelvic ultrasound for patient 02/20/15 at 1300 for ultrasound 1330 consult. Patient agreeable.  Routing to provider for final review. Patient agreeable to disposition. Will close encounter.

## 2015-02-11 NOTE — Telephone Encounter (Signed)
Discussed benefit with patient for pelvic ultrasound. Patient understands and agreeable. Patient had clinical questions prior to scheduling. Transferred to [place] to discuss.

## 2015-02-14 NOTE — Patient Instructions (Addendum)
YOUR PROCEDURE IS SCHEDULED ON :  02/24/15  REPORT TO [place] MAIN ENTRANCE FOLLOW SIGNS TO EAST ELEVATOR - GO TO 3rd FLOOR CHECK IN AT 3 EAST NURSES STATION (SHORT STAY) AT:  11:45 AM  CALL THIS NUMBER IF YOU HAVE PROBLEMS THE MORNING OF SURGERY [phone]  REMEMBER:ONLY 1 PER PERSON MAY GO TO SHORT STAY WITH YOU TO GET READY THE MORNING OF YOUR SURGERY  DO NOT EAT FOOD  AFTER MIDNIGHT  MAY HAVE CLEAR LIQUIDS UNTIL 8:45 AM  TAKE THESE MEDICINES THE MORNING OF SURGERY: FLUOXETINE / MAY USE EYE DROPS  CLEAR LIQUID DIET  Foods Allowed                                                                     Foods Excluded  Coffee and tea, regular and decaf                             liquids that you cannot  Plain Jell-O in any flavor                                             see through such as: Fruit ices (not with fruit pulp)                                     milk, soups, orange juice  Iced Popsicles                                                 All solid food Carbonated beverages, regular and diet                                    Cranberry, grape and apple juices Sports drinks like Gatorade Lightly seasoned clear broth or consume(fat free) Sugar, honey syrup  _____________________________________________________________________  BRING C-PAP TUBING AND MASK TO HOSPITAL   YOU MAY NOT HAVE ANY METAL ON YOUR BODY INCLUDING HAIR PINS AND PIERCING'S. DO NOT WEAR JEWELRY, MAKEUP, LOTIONS, POWDERS OR PERFUMES. DO NOT WEAR NAIL POLISH. DO NOT SHAVE 48 HRS PRIOR TO SURGERY. MEN MAY SHAVE FACE AND NECK.  DO NOT [place]. [place] IS NOT RESPONSIBLE FOR VALUABLES.  CONTACTS, DENTURES OR PARTIALS MAY NOT BE WORN TO SURGERY. LEAVE SUITCASE IN CAR. CAN BE BROUGHT TO ROOM AFTER SURGERY.  PATIENTS DISCHARGED THE DAY OF SURGERY WILL NOT BE ALLOWED TO DRIVE HOME.  PLEASE READ OVER THE FOLLOWING INSTRUCTION  SHEETS _________________________________________________________________________________                                          [place] - PREPARING FOR SURGERY  Before surgery, you can  play an important role.  Because skin is not sterile, your skin needs to be as free of germs as possible.  You can reduce the number of germs on your skin by washing with CHG (chlorahexidine gluconate) soap before surgery.  CHG is an antiseptic cleaner which kills germs and bonds with the skin to continue killing germs even after washing. Please DO NOT use if you have an allergy to CHG or antibacterial soaps.  If your skin becomes reddened/irritated stop using the CHG and inform your nurse when you arrive at Short Stay. Do not shave (including legs and underarms) for at least 48 hours prior to the first CHG shower.  You may shave your face. Please follow these instructions carefully:   1.  Shower with CHG Soap the night before surgery and the  morning of Surgery.   2.  If you choose to wash your hair, wash your hair first as usual with your  normal  Shampoo.   3.  After you shampoo, rinse your hair and body thoroughly to remove the  shampoo.                                         4.  Use CHG as you would any other liquid soap.  You can apply chg directly  to the skin and wash . Gently wash with scrungie or clean wascloth    5.  Apply the CHG Soap to your body ONLY FROM THE NECK DOWN.   Do not use on open                           Wound or open sores. Avoid contact with eyes, ears mouth and genitals (private parts).                        Genitals (private parts) with your normal soap.              6.  Wash thoroughly, paying special attention to the area where your surgery  will be performed.   7.  Thoroughly rinse your body with warm water from the neck down.   8.  DO NOT shower/wash with your normal soap after using and rinsing off  the CHG Soap .                9.  Pat yourself dry with a clean  towel.             10.  Wear clean night clothes to bed after shower             11.  Place clean sheets on your bed the night of your first shower and do not  sleep with pets.  Day of Surgery : Do not apply any lotions/deodorants the morning of surgery.  Please wear clean clothes to the hospital/surgery center.  FAILURE TO FOLLOW THESE INSTRUCTIONS MAY RESULT IN THE CANCELLATION OF YOUR SURGERY    PATIENT SIGNATURE_________________________________  ______________________________________________________________________     Susan Esparza  An incentive spirometer is a tool that can help keep your lungs clear and active. This tool measures how well you are filling your lungs with each breath. Taking long deep breaths may help reverse or decrease the chance of developing breathing (pulmonary) problems (especially infection) following:  A long period of time when  you are unable to move or be active. BEFORE THE PROCEDURE   If the spirometer includes an indicator to show your best effort, your nurse or respiratory therapist will set it to a desired goal.  If possible, sit up straight or lean slightly forward. Try not to slouch.  Hold the incentive spirometer in an upright position. INSTRUCTIONS FOR USE   Sit on the edge of your bed if possible, or sit up as far as you can in bed or on a chair.  Hold the incentive spirometer in an upright position.  Breathe out normally.  Place the mouthpiece in your mouth and seal your lips tightly around it.  Breathe in slowly and as deeply as possible, raising the piston or the ball toward the top of the column.  Hold your breath for 3-5 seconds or for as long as possible. Allow the piston or ball to fall to the bottom of the column.  Remove the mouthpiece from your mouth and breathe out normally.  Rest for a few seconds and repeat Steps 1 through 7 at least 10 times every 1-2 hours when you are awake. Take your time and take a few  normal breaths between deep breaths.  The spirometer may include an indicator to show your best effort. Use the indicator as a goal to work toward during each repetition.  After each set of 10 deep breaths, practice coughing to be sure your lungs are clear. If you have an incision (the cut made at the time of surgery), support your incision when coughing by placing a pillow or rolled up towels firmly against it. Once you are able to get out of bed, walk around indoors and cough well. You may stop using the incentive spirometer when instructed by your caregiver.  RISKS AND COMPLICATIONS  Take your time so you do not get dizzy or light-headed.  If you are in pain, you may need to take or ask for pain medication before doing incentive spirometry. It is harder to take a deep breath if you are having pain. AFTER USE  Rest and breathe slowly and easily.  It can be helpful to keep track of a log of your progress. Your caregiver can provide you with a simple table to help with this. If you are using the spirometer at home, follow these instructions: [place] IF:   You are having difficultly using the spirometer.  You have trouble using the spirometer as often as instructed.  Your pain medication is not giving enough relief while using the spirometer.  You develop fever of 100.5 F (38.1 C) or higher. SEEK IMMEDIATE MEDICAL CARE IF:   You cough up bloody sputum that had not been present before.  You develop fever of 102 F (38.9 C) or greater.  You develop worsening pain at or near the incision site. MAKE SURE YOU:   Understand these instructions.  Will watch your condition.  Will get help right away if you are not doing well or get worse. Document Released: 08/02/2006 Document Revised: 06/14/2011 Document Reviewed: 10/03/2006 [employer] Patient Information 2014 [employer], [place].   ________________________________________________________________________  WHAT IS A BLOOD  TRANSFUSION? Blood Transfusion Information  A transfusion is the replacement of blood or some of its parts. Blood is made up of multiple cells which provide different functions.  Red blood cells carry oxygen and are used for blood loss replacement.  White blood cells fight against infection.  Platelets control bleeding.  Plasma helps clot blood.  Other  blood products are available for specialized needs, such as hemophilia or other clotting disorders. BEFORE THE TRANSFUSION  Who gives blood for transfusions?   Healthy volunteers who are fully evaluated to make sure their blood is safe. This is blood bank blood. Transfusion therapy is the safest it has ever been in the practice of medicine. Before blood is taken from a donor, a complete history is taken to make sure that person has no history of diseases nor engages in risky social behavior (examples are intravenous drug use or sexual activity with multiple partners). The donor's travel history is screened to minimize risk of transmitting infections, such as malaria. The donated blood is tested for signs of infectious diseases, such as HIV and hepatitis. The blood is then tested to be sure it is compatible with you in order to minimize the chance of a transfusion reaction. If you or a relative donates blood, this is often done in anticipation of surgery and is not appropriate for emergency situations. It takes many days to process the donated blood. RISKS AND COMPLICATIONS Although transfusion therapy is very safe and saves many lives, the main dangers of transfusion include:   Getting an infectious disease.  Developing a transfusion reaction. This is an allergic reaction to something in the blood you were given. Every precaution is taken to prevent this. The decision to have a blood transfusion has been considered carefully by your caregiver before blood is given. Blood is not given unless the benefits outweigh the risks. AFTER THE  TRANSFUSION  Right after receiving a blood transfusion, you will usually feel much better and more energetic. This is especially true if your red blood cells have gotten low (anemic). The transfusion raises the level of the red blood cells which carry oxygen, and this usually causes an energy increase.  The nurse administering the transfusion will monitor you carefully for complications. HOME CARE INSTRUCTIONS  No special instructions are needed after a transfusion. You may find your energy is better. Speak with your caregiver about any limitations on activity for underlying diseases you may have. SEEK MEDICAL CARE IF:   Your condition is not improving after your transfusion.  You develop redness or irritation at the intravenous (IV) site. SEEK IMMEDIATE MEDICAL CARE IF:  Any of the following symptoms occur over the next 12 hours:  Shaking chills.  You have a temperature by mouth above 102 F (38.9 C), not controlled by medicine.  Chest, back, or muscle pain.  People around you feel you are not acting correctly or are confused.  Shortness of breath or difficulty breathing.  Dizziness and fainting.  You get a rash or develop hives.  You have a decrease in urine output.  Your urine turns a dark color or changes to pink, red, or brown. Any of the following symptoms occur over the next 10 days:  You have a temperature by mouth above 102 F (38.9 C), not controlled by medicine.  Shortness of breath.  Weakness after normal activity.  The white part of the eye turns yellow (jaundice).  You have a decrease in the amount of urine or are urinating less often.  Your urine turns a dark color or changes to pink, red, or brown. Document Released: 03/19/2000 Document Revised: 06/14/2011 Document Reviewed: 11/06/2007 [facility] Patient Information 2014 [place], [place].  _______________________________________________________________________

## 2015-02-17 ENCOUNTER — Encounter (HOSPITAL_COMMUNITY)
Admission: RE | Admit: 2015-02-17 | Discharge: 2015-02-17 | Disposition: A | Discharge status: Home / Self Care | Payer: No Typology Code available for payment source | Source: Ambulatory Visit | Attending: Orthopedic Surgery | Admitting: Orthopedic Surgery

## 2015-02-17 ENCOUNTER — Encounter (HOSPITAL_COMMUNITY): Payer: Self-pay

## 2015-02-17 DIAGNOSIS — Z01812 Encounter for preprocedural laboratory examination: Secondary | ICD-10-CM | POA: Insufficient documentation

## 2015-02-17 DIAGNOSIS — Z01810 Encounter for preprocedural cardiovascular examination: Secondary | ICD-10-CM | POA: Diagnosis present

## 2015-02-17 HISTORY — DX: Anesthesia of skin: R20.0

## 2015-02-17 HISTORY — DX: Sleep apnea, unspecified: G47.30

## 2015-02-17 HISTORY — DX: Unspecified osteoarthritis, unspecified site: M19.90

## 2015-02-17 HISTORY — DX: Cardiac arrhythmia, unspecified: I49.9

## 2015-02-17 HISTORY — DX: Dry eye syndrome of bilateral lacrimal glands: H04.123

## 2015-02-17 LAB — ABO/RH: ABO/RH(D): O NEG

## 2015-02-17 LAB — URINALYSIS, ROUTINE W REFLEX MICROSCOPIC
Bilirubin Urine: NEGATIVE
Glucose, UA: NEGATIVE mg/dL
Hgb urine dipstick: NEGATIVE
Ketones, ur: NEGATIVE mg/dL
LEUKOCYTES UA: NEGATIVE
NITRITE: NEGATIVE
PROTEIN: NEGATIVE mg/dL
Specific Gravity, Urine: 1.005 (ref 1.005–1.030)
UROBILINOGEN UA: 0.2 mg/dL (ref 0.0–1.0)
pH: 6.5 (ref 5.0–8.0)

## 2015-02-17 LAB — COMPREHENSIVE METABOLIC PANEL
ALBUMIN: 4.4 g/dL (ref 3.5–5.0)
ALK PHOS: 63 U/L (ref 38–126)
ALT: 21 U/L (ref 14–54)
ANION GAP: 9 (ref 5–15)
AST: 27 U/L (ref 15–41)
BILIRUBIN TOTAL: 0.6 mg/dL (ref 0.3–1.2)
BUN: 13 mg/dL (ref 6–20)
CALCIUM: 9.5 mg/dL (ref 8.9–10.3)
CO2: 23 mmol/L (ref 22–32)
CREATININE: 0.54 mg/dL (ref 0.44–1.00)
Chloride: 107 mmol/L (ref 101–111)
GFR calc Af Amer: >60 mL/min (ref >60)
GFR calc non Af Amer: >60 mL/min (ref >60)
GLUCOSE: 91 mg/dL (ref 65–99)
Potassium: 4.2 mmol/L (ref 3.5–5.1)
Sodium: 139 mmol/L (ref 135–145)
TOTAL PROTEIN: 7.5 g/dL (ref 6.5–8.1)

## 2015-02-17 LAB — CBC
HEMATOCRIT: 40.7 % (ref 36.0–46.0)
HEMOGLOBIN: 13.8 g/dL (ref 12.0–15.0)
MCH: 30.6 pg (ref 26.0–34.0)
MCHC: 33.9 g/dL (ref 30.0–36.0)
MCV: 90.2 fL (ref 78.0–100.0)
Platelets: 224 10*3/uL (ref 150–400)
RBC: 4.51 MIL/uL (ref 3.87–5.11)
RDW: 13.6 % (ref 11.5–15.5)
WBC: 5.9 10*3/uL (ref 4.0–10.5)

## 2015-02-17 LAB — PROTIME-INR
INR: 0.96 (ref 0.00–1.49)
Prothrombin Time: 12.9 seconds (ref 11.6–15.2)

## 2015-02-17 LAB — SURGICAL PCR SCREEN
MRSA, PCR: NEGATIVE
Staphylococcus aureus: NEGATIVE

## 2015-02-17 LAB — APTT: APTT: 30 s (ref 24–37)

## 2015-02-20 ENCOUNTER — Ambulatory Visit (INDEPENDENT_AMBULATORY_CARE_PROVIDER_SITE_OTHER): Payer: PRIVATE HEALTH INSURANCE | Admitting: Obstetrics & Gynecology

## 2015-02-20 ENCOUNTER — Ambulatory Visit (INDEPENDENT_AMBULATORY_CARE_PROVIDER_SITE_OTHER): Payer: PRIVATE HEALTH INSURANCE

## 2015-02-20 VITALS — BP 102/62 | HR 60 | Resp 16 | Wt 176.0 lb

## 2015-02-20 DIAGNOSIS — D251 Intramural leiomyoma of uterus: Secondary | ICD-10-CM

## 2015-02-20 NOTE — Progress Notes (Signed)
59 y.o. G3P2MWF here for PUS due to uterine fibroids.  Pt having knee replacement on Monday.  With her evaluation, fibroid was seen on one of her x-rays.  She has known hx of fibroids and the x ray finding was difficult to correlate with prior ultrasound findings so PUS planned.  She is not having any bleeding.  Denies vaginal discharge or pelvic pain.    Patient's last menstrual period was 01/04/2008.  Sexually active:  yes  Contraception: vasectomy  FINDINGS: UTERUS: 7.7 x 5.4 x 4.5cm with at least five fibroids.  Largest are 3.0cm (two of these), then 2.8cm, 1.8cm, and 1.3cm.  One is calcified which is consistent with the x ray finding.  Today's images compared with prior ultrasound done 02/03/11 showing four fibroids measuring 4.4cm, 3.7c,. 3.0cm and 2.4cm.  All fibroids are smaller. EMS: 1.68mm ADNEXA:   Left ovary 1.8 x 1.4 x [id number] with 7mm follicle   Right ovary 2.3 x 1.0 x 0000000 with 43mm follicle CUL DE SAC: no free fluid  Images reviewed with pt.  As all fibroids are smaller than with previous imaging on 02/03/11, do not feel any additional imaging is necessary.  Small follicular findings on ovaries reviewed.  No vascularity or masses noted.  Do not feel these need to be followed at this time.  All questions answered.  Pt having knee replacement on Monday.  H/O DVT with knee arthroscopy.  Pt is going to be on Lovenox post-operatively.  Wished her well.  Assessment:  Intramural fibroids, one with calificiations Plan: Plan routine follow-up.  Appt for next year is already scheduled  ~15 minutes spent with patient >50% of time was in face to face discussion of above.

## 2015-02-21 NOTE — H&P (Signed)
TOTAL KNEE ADMISSION H&P  Patient is being admitted for right total knee arthroplasty.  Subjective:  Chief Complaint:right knee pain.  HPI: Susan Esparza, 59 y.o. female, has a history of pain and functional disability in the right knee due to arthritis and has failed non-surgical conservative treatments for greater than 12 weeks to includeNSAID's and/or analgesics, corticosteriod injections and activity modification.  Onset of symptoms was gradual, starting >10 years ago with gradually worsening course since that time. The patient noted prior procedures on the knee to include  arthroscopy and menisectomy on the right knee(s).  Patient currently rates pain in the right knee(s) at 8 out of 10 with activity. Patient has night pain, worsening of pain with activity and weight bearing, pain that interferes with activities of daily living, pain with passive range of motion, crepitus and joint swelling.  Patient has evidence of periarticular osteophytes and joint space narrowing by imaging studies. There is no active infection.  Patient Active Problem List   Diagnosis Date Noted  . Routine general medical examination at a health care facility 09/18/2014  . Osteoarthritis of right lower extremity 06/26/2014  . Attention deficit disorder 03/23/2010  . WEIGHT GAIN 09/04/2009  . DVT 06/07/2007   Past Medical History  Diagnosis Date  . Allergic rhinitis   . Depression   . DVT (deep vein thrombosis) in pregnancy 2009    left leg after knee Arthroscopy  . Anxiety   . Numbness     rt hand  . Arthritis   . Sleep apnea     "borderline"  uses c-pap  . Chronic dryness of both eyes   . Dysrhythmia     HX of "[place]"    Past Surgical History  Procedure Laterality Date  . Knee arthroscopy Right 1989  . Knee arthroscopy Left 2009  . Facial cosmetic surgery  2006  . Nasal polyp surgery  01/2008       . Endometrial ablation  2006      Current outpatient prescriptions:  .  diclofenac  (CATAFLAM) 50 MG tablet, Take 1 tablet (50 mg total) by mouth 2 (two) times daily. (Patient taking differently: Take 50 mg by mouth daily. ), Disp: 60 tablet, Rfl: 1 .  FLUoxetine (PROZAC) 20 MG tablet, TAKE ONE TABLET BY MOUTH ONE TIME DAILY, Disp: 30 tablet, Rfl: 5 .  MAGNESIUM CITRATE PO, Take 2 tablets by mouth at bedtime., Disp: , Rfl:  .  psyllium (HYDROCIL/METAMUCIL) 95 % PACK, Take 1 packet by mouth daily., Disp: , Rfl:  .  Pyridoxine HCl (VITAMIN B-6 PO), Take 1 tablet by mouth daily., Disp: , Rfl:  .  XIIDRA 5 % SOLN, Place 1 drop into both eyes 2 (two) times daily., Disp: , Rfl: 4  Allergies  Allergen Reactions  . Fibrin Sealant Component Other (See Comments)    Did not work. Wound came apart.  . Penicillins Hives    Has patient had a PCN reaction causing immediate rash, facial/tongue/throat swelling, SOB or lightheadedness with hypotension: No Has patient had a PCN reaction causing severe rash involving mucus membranes or skin necrosis: No Has patient had a PCN reaction that required hospitalization No Has patient had a PCN reaction occurring within the last 10 years: No If all of the above answers are "NO", then may proceed with Cephalosporin use.   . Bupropion Hcl Itching and Rash    Social History  Substance Use Topics  . Smoking status: Never Smoker   . Smokeless tobacco: Never Used  .  Alcohol Use: 2.5 oz/week    5 Standard drinks or equivalent per week     Comment: occasional    Family History  Problem Relation Age of Onset  . Hypertension Mother   . Osteoarthritis Mother   . Cancer Father     colon cancer  . Heart disease Father     tacycardia  . Diabetes Maternal Grandfather      Review of Systems  Constitutional: Negative.   HENT: Negative.   Eyes: Negative.   Respiratory: Negative.   Cardiovascular: Negative.   Gastrointestinal: Negative.   Genitourinary: Negative.   Musculoskeletal: Positive for myalgias and joint pain. Negative for back pain,  falls and neck pain.       Right knee pain  Skin: Negative.   Neurological: Negative.   Endo/Heme/Allergies: Negative.   Psychiatric/Behavioral: Negative.     Objective:  Physical Exam  Constitutional: She is oriented to person, place, and time. She appears well-developed and well-nourished. No distress.  HENT:  Head: Normocephalic and atraumatic.  Right Ear: External ear normal.  Left Ear: External ear normal.  Nose: Nose normal.  Mouth/Throat: Oropharynx is clear and moist.  Eyes: Conjunctivae and EOM are normal.  Neck: Normal range of motion. Neck supple.  Cardiovascular: Normal rate, regular rhythm, normal heart sounds and intact distal pulses.   No murmur heard. Respiratory: Effort normal and breath sounds normal. No respiratory distress. She has no wheezes.  GI: Soft. Bowel sounds are normal. She exhibits no distension. There is no tenderness.  Musculoskeletal:       Right hip: Normal.       Left hip: Normal.       Left knee: Normal.  Her hip shows normal range of motion, no discomfort. Left knee, no effusion, range 0 to 130 with no tenderness or instability. Right knee, slight valgus, range 0 to 125, slight crepitus on range of motion, tenderness lateral greater than medial with no instability noted.  Neurological: She is oriented to person, place, and time. She has normal strength and normal reflexes. No sensory deficit.  Skin: No rash noted. She is not diaphoretic. No erythema.  Psychiatric: She has a normal mood and affect. Her behavior is normal.    Vital signs in last 24 hours: Pulse Rate:  [60] 60 (11/17 1327) Resp:  [16] 16 (11/17 1327) BP: (102)/(62) 102/62 mmHg (11/17 1327) Weight:  [79.833 kg (176 lb)] 79.833 kg (176 lb) (11/17 1327)  Labs:   Estimated body mass index is 30.04 kg/(m^2) as calculated from the following:   Height as of 09/18/14: 5\' 6"  (1.676 m).   Weight as of 09/18/14: 84.369 kg (186 lb).   Imaging Review Plain radiographs demonstrate  severe degenerative joint disease of the right knee(s). The overall alignment ismild valgus. The bone quality appears to be good for age and reported activity level.  Assessment/Plan:  End stage primary osteoarthritis, right knee   The patient history, physical examination, clinical judgment of the provider and imaging studies are consistent with end stage degenerative joint disease of the right knee(s) and total knee arthroplasty is deemed medically necessary. The treatment options including medical management, injection therapy arthroscopy and arthroplasty were discussed at length. The risks and benefits of total knee arthroplasty were presented and reviewed. The risks due to aseptic loosening, infection, stiffness, patella tracking problems, thromboembolic complications and other imponderables were discussed. The patient acknowledged the explanation, agreed to proceed with the plan and consent was signed. Patient is being admitted for inpatient treatment  for surgery, pain control, PT, OT, prophylactic antibiotics, VTE prophylaxis, progressive ambulation and ADL's and discharge planning. The patient is planning to be discharged home with home health services    PCP: Dr. Burnadette Pop  Ardeen Jourdain, PA-C

## 2015-02-23 ENCOUNTER — Encounter: Payer: Self-pay | Admitting: Obstetrics & Gynecology

## 2015-02-24 ENCOUNTER — Inpatient Hospital Stay (HOSPITAL_COMMUNITY): Payer: 59 | Admitting: Anesthesiology

## 2015-02-24 ENCOUNTER — Inpatient Hospital Stay (HOSPITAL_COMMUNITY)
Admission: RE | Admit: 2015-02-24 | Discharge: 2015-02-26 | DRG: 470 | Disposition: A | Discharge status: Home Health | Payer: 59 | Source: Ambulatory Visit | Attending: Orthopedic Surgery | Admitting: Orthopedic Surgery

## 2015-02-24 ENCOUNTER — Encounter (HOSPITAL_COMMUNITY): Payer: Self-pay | Admitting: *Deleted

## 2015-02-24 ENCOUNTER — Encounter (HOSPITAL_COMMUNITY)
Admission: RE | Disposition: A | Discharge status: Home Health | Payer: Self-pay | Source: Ambulatory Visit | Attending: Orthopedic Surgery

## 2015-02-24 DIAGNOSIS — M25561 Pain in right knee: Secondary | ICD-10-CM | POA: Diagnosis present

## 2015-02-24 DIAGNOSIS — M179 Osteoarthritis of knee, unspecified: Secondary | ICD-10-CM | POA: Diagnosis present

## 2015-02-24 DIAGNOSIS — M1710 Unilateral primary osteoarthritis, unspecified knee: Secondary | ICD-10-CM | POA: Diagnosis present

## 2015-02-24 DIAGNOSIS — G4730 Sleep apnea, unspecified: Secondary | ICD-10-CM | POA: Diagnosis present

## 2015-02-24 DIAGNOSIS — F329 Major depressive disorder, single episode, unspecified: Secondary | ICD-10-CM | POA: Diagnosis present

## 2015-02-24 DIAGNOSIS — Z01812 Encounter for preprocedural laboratory examination: Secondary | ICD-10-CM | POA: Diagnosis not present

## 2015-02-24 DIAGNOSIS — M1711 Unilateral primary osteoarthritis, right knee: Secondary | ICD-10-CM | POA: Diagnosis present

## 2015-02-24 HISTORY — PX: TOTAL KNEE ARTHROPLASTY: SHX125

## 2015-02-24 LAB — TYPE AND SCREEN
ABO/RH(D): O NEG
Antibody Screen: NEGATIVE

## 2015-02-24 SURGERY — ARTHROPLASTY, KNEE, TOTAL
Anesthesia: Spinal | Site: Knee | Laterality: Right

## 2015-02-24 MED ORDER — PROPOFOL 500 MG/50ML IV EMUL
INTRAVENOUS | Status: DC | PRN
  Administered 2015-02-24: 50 ug/kg/min via INTRAVENOUS

## 2015-02-24 MED ORDER — MIDAZOLAM HCL 5 MG/5ML IJ SOLN
INTRAMUSCULAR | Status: DC | PRN
  Administered 2015-02-24: 2 mg via INTRAVENOUS

## 2015-02-24 MED ORDER — KETOROLAC TROMETHAMINE 15 MG/ML IJ SOLN
7.5000 mg | Freq: Four times a day (QID) | INTRAMUSCULAR | Status: AC | PRN
  Administered 2015-02-24 – 2015-02-25 (×3): 7.5 mg via INTRAVENOUS
  Filled 2015-02-24 (×3): qty 1

## 2015-02-24 MED ORDER — HYDROMORPHONE HCL 1 MG/ML IJ SOLN
INTRAMUSCULAR | Status: AC
  Filled 2015-02-24: qty 1

## 2015-02-24 MED ORDER — DEXTROSE-NACL 5-0.45 % IV SOLN
INTRAVENOUS | Status: DC
  Administered 2015-02-24: 19:00:00 via INTRAVENOUS

## 2015-02-24 MED ORDER — DIPHENHYDRAMINE HCL 12.5 MG/5ML PO ELIX
12.5000 mg | ORAL_SOLUTION | ORAL | Status: DC | PRN
  Administered 2015-02-25: 25 mg via ORAL
  Filled 2015-02-24: qty 10

## 2015-02-24 MED ORDER — METHOCARBAMOL 500 MG PO TABS
500.0000 mg | ORAL_TABLET | Freq: Four times a day (QID) | ORAL | Status: DC | PRN
  Administered 2015-02-24 – 2015-02-26 (×5): 500 mg via ORAL
  Filled 2015-02-24 (×5): qty 1

## 2015-02-24 MED ORDER — VANCOMYCIN HCL IN DEXTROSE 1-5 GM/200ML-% IV SOLN
INTRAVENOUS | Status: AC
  Filled 2015-02-24: qty 200

## 2015-02-24 MED ORDER — BUPIVACAINE HCL (PF) 0.25 % IJ SOLN
INTRAMUSCULAR | Status: AC
  Filled 2015-02-24: qty 30

## 2015-02-24 MED ORDER — DEXAMETHASONE SODIUM PHOSPHATE 10 MG/ML IJ SOLN
10.0000 mg | Freq: Once | INTRAMUSCULAR | Status: AC
  Administered 2015-02-24: 10 mg via INTRAVENOUS

## 2015-02-24 MED ORDER — FENTANYL CITRATE (PF) 100 MCG/2ML IJ SOLN
INTRAMUSCULAR | Status: AC
  Filled 2015-02-24: qty 2

## 2015-02-24 MED ORDER — LACTATED RINGERS IV SOLN
INTRAVENOUS | Status: DC
  Administered 2015-02-24: 16:00:00 via INTRAVENOUS
  Administered 2015-02-24: 1000 mL via INTRAVENOUS

## 2015-02-24 MED ORDER — CHLORHEXIDINE GLUCONATE 4 % EX LIQD: 60.0000 mL | Freq: Once | CUTANEOUS | Status: DC

## 2015-02-24 MED ORDER — ACETAMINOPHEN 650 MG RE SUPP: 650.0000 mg | Freq: Four times a day (QID) | RECTAL | Status: DC | PRN

## 2015-02-24 MED ORDER — BUPIVACAINE HCL (PF) 0.75 % IJ SOLN
INTRAMUSCULAR | Status: DC | PRN
  Administered 2015-02-24: 1.6 mL via INTRATHECAL

## 2015-02-24 MED ORDER — BISACODYL 10 MG RE SUPP: 10.0000 mg | Freq: Every day | RECTAL | Status: DC | PRN

## 2015-02-24 MED ORDER — EPHEDRINE SULFATE 50 MG/ML IJ SOLN
INTRAMUSCULAR | Status: DC | PRN
  Administered 2015-02-24: 10 mg via INTRAVENOUS

## 2015-02-24 MED ORDER — FLUOXETINE HCL 20 MG PO TABS
20.0000 mg | ORAL_TABLET | Freq: Every day | ORAL | Status: DC
Start: 2015-02-25 — End: 2015-02-26
  Administered 2015-02-25 – 2015-02-26 (×2): 20 mg via ORAL
  Filled 2015-02-24 (×2): qty 1

## 2015-02-24 MED ORDER — BUPIVACAINE LIPOSOME 1.3 % IJ SUSP
INTRAMUSCULAR | Status: DC | PRN
  Administered 2015-02-24: 20 mL

## 2015-02-24 MED ORDER — ACETAMINOPHEN 500 MG PO TABS
1000.0000 mg | ORAL_TABLET | Freq: Four times a day (QID) | ORAL | Status: AC
  Administered 2015-02-24 – 2015-02-25 (×4): 1000 mg via ORAL
  Filled 2015-02-24 (×4): qty 2

## 2015-02-24 MED ORDER — POLYETHYLENE GLYCOL 3350 17 G PO PACK
17.0000 g | PACK | Freq: Every day | ORAL | Status: DC | PRN
  Administered 2015-02-26: 17 g via ORAL
  Filled 2015-02-24: qty 1

## 2015-02-24 MED ORDER — VANCOMYCIN HCL IN DEXTROSE 1-5 GM/200ML-% IV SOLN
1000.0000 mg | Freq: Once | INTRAVENOUS | Status: AC
Start: 2015-02-24 — End: 2015-02-24
  Administered 2015-02-24: 1000 mg via INTRAVENOUS

## 2015-02-24 MED ORDER — OXYCODONE HCL 5 MG PO TABS
5.0000 mg | ORAL_TABLET | ORAL | Status: DC | PRN
  Administered 2015-02-24: 10 mg via ORAL
  Administered 2015-02-24: 5 mg via ORAL
  Administered 2015-02-25 – 2015-02-26 (×8): 10 mg via ORAL
  Filled 2015-02-24: qty 2
  Filled 2015-02-24: qty 1
  Filled 2015-02-24 (×6): qty 2
  Filled 2015-02-24: qty 1
  Filled 2015-02-24 (×2): qty 2

## 2015-02-24 MED ORDER — GLYCOPYRROLATE 0.2 MG/ML IJ SOLN
INTRAMUSCULAR | Status: DC | PRN
  Administered 2015-02-24: 0.2 mg via INTRAVENOUS

## 2015-02-24 MED ORDER — ACETAMINOPHEN 325 MG PO TABS
650.0000 mg | ORAL_TABLET | Freq: Four times a day (QID) | ORAL | Status: DC | PRN
  Administered 2015-02-26: 650 mg via ORAL
  Filled 2015-02-24: qty 2

## 2015-02-24 MED ORDER — MIDAZOLAM HCL 2 MG/2ML IJ SOLN
INTRAMUSCULAR | Status: AC
  Filled 2015-02-24: qty 2

## 2015-02-24 MED ORDER — SODIUM CHLORIDE 0.9 % IV SOLN
2000.0000 mg | INTRAVENOUS | Status: DC | PRN
  Administered 2015-02-24: 2000 mg via TOPICAL

## 2015-02-24 MED ORDER — PHENOL 1.4 % MT LIQD
1.0000 | OROMUCOSAL | Status: DC | PRN
Start: 2015-02-24 — End: 2015-02-26

## 2015-02-24 MED ORDER — CEFAZOLIN SODIUM-DEXTROSE 2-3 GM-% IV SOLR: 2.0000 g | INTRAVENOUS | Status: DC

## 2015-02-24 MED ORDER — DEXAMETHASONE SODIUM PHOSPHATE 10 MG/ML IJ SOLN
10.0000 mg | Freq: Once | INTRAMUSCULAR | Status: AC
  Administered 2015-02-25: 10 mg via INTRAVENOUS
  Filled 2015-02-24: qty 1

## 2015-02-24 MED ORDER — FENTANYL CITRATE (PF) 100 MCG/2ML IJ SOLN
INTRAMUSCULAR | Status: DC | PRN
  Administered 2015-02-24: 100 ug via INTRAVENOUS

## 2015-02-24 MED ORDER — FLEET ENEMA 7-19 GM/118ML RE ENEM: 1.0000 | ENEMA | Freq: Once | RECTAL | Status: DC | PRN

## 2015-02-24 MED ORDER — MENTHOL 3 MG MT LOZG: 1.0000 | LOZENGE | OROMUCOSAL | Status: DC | PRN

## 2015-02-24 MED ORDER — ACETAMINOPHEN 10 MG/ML IV SOLN
INTRAVENOUS | Status: AC
Start: 2015-02-24 — End: 2015-02-24
  Filled 2015-02-24: qty 100

## 2015-02-24 MED ORDER — MORPHINE SULFATE (PF) 2 MG/ML IV SOLN
1.0000 mg | INTRAVENOUS | Status: DC | PRN
  Administered 2015-02-24: 1 mg via INTRAVENOUS
  Filled 2015-02-24: qty 1

## 2015-02-24 MED ORDER — SODIUM CHLORIDE 0.9 % IV SOLN: INTRAVENOUS | Status: DC

## 2015-02-24 MED ORDER — SODIUM CHLORIDE 0.9 % IJ SOLN
INTRAMUSCULAR | Status: DC | PRN
  Administered 2015-02-24: 30 mL

## 2015-02-24 MED ORDER — BUPIVACAINE LIPOSOME 1.3 % IJ SUSP
20.0000 mL | Freq: Once | INTRAMUSCULAR | Status: DC
Start: 2015-02-24 — End: 2015-02-24
  Filled 2015-02-24: qty 20

## 2015-02-24 MED ORDER — ACETAMINOPHEN 10 MG/ML IV SOLN
1000.0000 mg | Freq: Once | INTRAVENOUS | Status: AC
  Administered 2015-02-24: 1000 mg via INTRAVENOUS

## 2015-02-24 MED ORDER — RIVAROXABAN 10 MG PO TABS
10.0000 mg | ORAL_TABLET | Freq: Every day | ORAL | Status: DC
  Administered 2015-02-25 – 2015-02-26 (×2): 10 mg via ORAL
  Filled 2015-02-24 (×4): qty 1

## 2015-02-24 MED ORDER — DEXTROSE 5 % IV SOLN
500.0000 mg | Freq: Four times a day (QID) | INTRAVENOUS | Status: DC | PRN
  Filled 2015-02-24: qty 5

## 2015-02-24 MED ORDER — PROPOFOL 10 MG/ML IV BOLUS
INTRAVENOUS | Status: AC
  Filled 2015-02-24: qty 40

## 2015-02-24 MED ORDER — SODIUM CHLORIDE 0.9 % IR SOLN
Status: DC | PRN
  Administered 2015-02-24: 1000 mL

## 2015-02-24 MED ORDER — HYDROMORPHONE HCL 1 MG/ML IJ SOLN
0.2500 mg | INTRAMUSCULAR | Status: DC | PRN
  Administered 2015-02-24 (×4): 0.5 mg via INTRAVENOUS

## 2015-02-24 MED ORDER — DEXAMETHASONE SODIUM PHOSPHATE 10 MG/ML IJ SOLN
INTRAMUSCULAR | Status: AC
  Filled 2015-02-24: qty 3

## 2015-02-24 MED ORDER — PROPOFOL 10 MG/ML IV BOLUS
INTRAVENOUS | Status: AC
Start: 2015-02-24 — End: 2015-02-24
  Filled 2015-02-24: qty 20

## 2015-02-24 MED ORDER — METOCLOPRAMIDE HCL 10 MG PO TABS: 5.0000 mg | ORAL_TABLET | Freq: Three times a day (TID) | ORAL | Status: DC | PRN

## 2015-02-24 MED ORDER — TRANEXAMIC ACID 1000 MG/10ML IV SOLN
2000.0000 mg | Freq: Once | INTRAVENOUS | Status: DC
  Filled 2015-02-24: qty 20

## 2015-02-24 MED ORDER — PROMETHAZINE HCL 25 MG/ML IJ SOLN: 6.2500 mg | INTRAMUSCULAR | Status: DC | PRN

## 2015-02-24 MED ORDER — ONDANSETRON HCL 4 MG/2ML IJ SOLN: 4.0000 mg | Freq: Four times a day (QID) | INTRAMUSCULAR | Status: DC | PRN

## 2015-02-24 MED ORDER — BUPIVACAINE HCL 0.25 % IJ SOLN
INTRAMUSCULAR | Status: DC | PRN
  Administered 2015-02-24: 20 mL

## 2015-02-24 MED ORDER — METOCLOPRAMIDE HCL 5 MG/ML IJ SOLN: 5.0000 mg | Freq: Three times a day (TID) | INTRAMUSCULAR | Status: DC | PRN

## 2015-02-24 MED ORDER — ONDANSETRON HCL 4 MG PO TABS: 4.0000 mg | ORAL_TABLET | Freq: Four times a day (QID) | ORAL | Status: DC | PRN

## 2015-02-24 MED ORDER — VANCOMYCIN HCL IN DEXTROSE 1-5 GM/200ML-% IV SOLN
1000.0000 mg | Freq: Two times a day (BID) | INTRAVENOUS | Status: AC
  Administered 2015-02-25: 1000 mg via INTRAVENOUS
  Filled 2015-02-24: qty 200

## 2015-02-24 MED ORDER — DOCUSATE SODIUM 100 MG PO CAPS
100.0000 mg | ORAL_CAPSULE | Freq: Two times a day (BID) | ORAL | Status: DC
  Administered 2015-02-24 – 2015-02-26 (×4): 100 mg via ORAL

## 2015-02-24 SURGICAL SUPPLY — 50 items
BAG DECANTER FOR FLEXI CONT (MISCELLANEOUS) ×2 IMPLANT
BAG ZIPLOCK 12X15 (MISCELLANEOUS) ×2 IMPLANT
BANDAGE ELASTIC 6 VELCRO ST LF (GAUZE/BANDAGES/DRESSINGS) ×2 IMPLANT
BLADE SAG 18X100X1.27 (BLADE) ×2 IMPLANT
BLADE SAW SGTL 11.0X1.19X90.0M (BLADE) ×2 IMPLANT
BOWL SMART MIX CTS (DISPOSABLE) ×2 IMPLANT
CAPT KNEE TOTAL 3 ATTUNE ×2 IMPLANT
CEMENT HV SMART SET (Cement) ×4 IMPLANT
CLOTH BEACON ORANGE TIMEOUT ST (SAFETY) ×2 IMPLANT
CUFF TOURN SGL QUICK 34 (TOURNIQUET CUFF) ×1
CUFF TRNQT CYL 34X4X40X1 (TOURNIQUET CUFF) ×1 IMPLANT
DECANTER SPIKE VIAL GLASS SM (MISCELLANEOUS) ×2 IMPLANT
DRAPE U-SHAPE 47X51 STRL (DRAPES) ×2 IMPLANT
DRSG ADAPTIC 3X8 NADH LF (GAUZE/BANDAGES/DRESSINGS) ×2 IMPLANT
DRSG PAD ABDOMINAL 8X10 ST (GAUZE/BANDAGES/DRESSINGS) IMPLANT
DURAPREP 26ML APPLICATOR (WOUND CARE) ×2 IMPLANT
ELECT REM PT RETURN 9FT ADLT (ELECTROSURGICAL) ×2
ELECTRODE REM PT RTRN 9FT ADLT (ELECTROSURGICAL) ×1 IMPLANT
EVACUATOR 1/8 PVC DRAIN (DRAIN) ×2 IMPLANT
GAUZE SPONGE 4X4 12PLY STRL (GAUZE/BANDAGES/DRESSINGS) ×2 IMPLANT
GLOVE BIO SURGEON STRL SZ7.5 (GLOVE) IMPLANT
GLOVE BIO SURGEON STRL SZ8 (GLOVE) ×2 IMPLANT
GLOVE BIOGEL PI IND STRL 6.5 (GLOVE) IMPLANT
GLOVE BIOGEL PI IND STRL 8 (GLOVE) ×1 IMPLANT
GLOVE BIOGEL PI INDICATOR 6.5 (GLOVE)
GLOVE BIOGEL PI INDICATOR 8 (GLOVE) ×1
GLOVE SURG SS PI 6.5 STRL IVOR (GLOVE) IMPLANT
GOWN STRL REUS W/TWL LRG LVL3 (GOWN DISPOSABLE) ×2 IMPLANT
GOWN STRL REUS W/TWL XL LVL3 (GOWN DISPOSABLE) IMPLANT
HANDPIECE INTERPULSE COAX TIP (DISPOSABLE) ×1
IMMOBILIZER KNEE 20 (SOFTGOODS) ×2
IMMOBILIZER KNEE 20 THIGH 36 (SOFTGOODS) ×1 IMPLANT
MANIFOLD NEPTUNE II (INSTRUMENTS) ×2 IMPLANT
NS IRRIG 1000ML POUR BTL (IV SOLUTION) ×2 IMPLANT
PACK TOTAL KNEE CUSTOM (KITS) ×2 IMPLANT
PAD ABD 8X10 STRL (GAUZE/BANDAGES/DRESSINGS) ×2 IMPLANT
PADDING CAST COTTON 6X4 STRL (CAST SUPPLIES) ×2 IMPLANT
POSITIONER SURGICAL ARM (MISCELLANEOUS) ×2 IMPLANT
SET HNDPC FAN SPRY TIP SCT (DISPOSABLE) ×1 IMPLANT
STRIP CLOSURE SKIN 1/2X4 (GAUZE/BANDAGES/DRESSINGS) ×2 IMPLANT
SUT MNCRL AB 4-0 PS2 18 (SUTURE) ×2 IMPLANT
SUT VIC AB 2-0 CT1 27 (SUTURE) ×3
SUT VIC AB 2-0 CT1 TAPERPNT 27 (SUTURE) ×3 IMPLANT
SUT VLOC 180 0 24IN GS25 (SUTURE) ×2 IMPLANT
SYR 50ML LL SCALE MARK (SYRINGE) ×2 IMPLANT
TRAY FOLEY W/METER SILVER 14FR (SET/KITS/TRAYS/PACK) ×2 IMPLANT
TRAY FOLEY W/METER SILVER 16FR (SET/KITS/TRAYS/PACK) IMPLANT
WATER STERILE IRR 1500ML POUR (IV SOLUTION) ×2 IMPLANT
WRAP KNEE MAXI GEL POST OP (GAUZE/BANDAGES/DRESSINGS) ×2 IMPLANT
YANKAUER SUCT BULB TIP 10FT TU (MISCELLANEOUS) ×2 IMPLANT

## 2015-02-24 NOTE — Anesthesia Preprocedure Evaluation (Addendum)
Anesthesia Evaluation  Patient identified by MRN, date of birth, ID band Patient awake    Reviewed: Allergy & Precautions, NPO status , Patient's Chart, lab work & pertinent test results  Airway Mallampati: II  TM Distance: >3 FB Neck ROM: Full    Dental no notable dental hx.    Pulmonary sleep apnea ,    Pulmonary exam normal breath sounds clear to auscultation       Cardiovascular Exercise Tolerance: Good + Peripheral Vascular Disease and + DVT  Normal cardiovascular exam+ dysrhythmias  Rhythm:Regular Rate:Normal     Neuro/Psych PSYCHIATRIC DISORDERS Anxiety Depression negative neurological ROS     GI/Hepatic negative GI ROS, Neg liver ROS,   Endo/Other  negative endocrine ROS  Renal/GU negative Renal ROS  negative genitourinary   Musculoskeletal  (+) Arthritis ,   Abdominal   Peds negative pediatric ROS (+)  Hematology negative hematology ROS (+)   Anesthesia Other Findings   Reproductive/Obstetrics negative OB ROS                             Anesthesia Physical Anesthesia Plan  ASA: II  Anesthesia Plan: Spinal   Post-op Pain Management:    Induction: Intravenous  Airway Management Planned: Natural Airway  Additional Equipment:   Intra-op Plan:   Post-operative Plan:   Informed Consent: I have reviewed the patients History and Physical, chart, labs and discussed the procedure including the risks, benefits and alternatives for the proposed anesthesia with the patient or authorized representative who has indicated his/her understanding and acceptance.   Dental advisory given  Plan Discussed with: CRNA  Anesthesia Plan Comments: (Discussed risks and benefits of and differences between spinal and general. Discussed risks of spinal including headache, backache, failure, bleeding and hematoma, infection, and nerve damage. Patient consents to spinal. Questions answered.  Coagulation studies and platelet count acceptable.)       Anesthesia Quick Evaluation

## 2015-02-24 NOTE — Transfer of Care (Signed)
Immediate Anesthesia Transfer of Care Note  Patient: Susan Esparza  Procedure(s) Performed: Procedure(s): TOTAL RIGHT KNEE ARTHROPLASTY (Right)  Patient Location: PACU  Anesthesia Type:Spinal  Level of Consciousness: awake, alert  and oriented  Airway & Oxygen Therapy: Patient Spontanous Breathing and Patient connected to face mask oxygen  Post-op Assessment: Report given to RN and Post -op Vital signs reviewed and stable  Post vital signs: Reviewed and stable  Last Vitals:  Filed Vitals:   02/24/15 1200  BP: 151/82  Pulse: 56  Temp: 36.6 C  Resp: 16    Complications: No apparent anesthesia complications

## 2015-02-24 NOTE — Anesthesia Postprocedure Evaluation (Signed)
Anesthesia Post Note  Patient: Demarie Atz  Procedure(s) Performed: Procedure(s) (LRB): TOTAL RIGHT KNEE ARTHROPLASTY (Right)  Patient location during evaluation: PACU Anesthesia Type: Spinal Level of consciousness: oriented and awake and alert Pain management: pain level controlled Vital Signs Assessment: post-procedure vital signs reviewed and stable Respiratory status: spontaneous breathing, respiratory function stable and patient connected to nasal cannula oxygen Cardiovascular status: blood pressure returned to baseline and stable Postop Assessment: No backache and No signs of nausea or vomiting Anesthetic complications: no    Last Vitals:  Filed Vitals:   02/24/15 1700 02/24/15 1737  BP: 169/88 165/75  Pulse: 51 54  Temp: 36.7 C 36.4 C  Resp: 15 15    Last Pain:  Filed Vitals:   02/24/15 1739  PainSc: 3     LLE Motor Response: Purposeful movement   RLE Motor Response: Purposeful movement   L Sensory Level: L5-Outer lower leg, top of foot, great toe R Sensory Level: L5-Outer lower leg, top of foot, great toe  Jalaine Riggenbach J

## 2015-02-24 NOTE — Interval H&P Note (Signed)
History and Physical Interval Note:  02/24/2015 1:52 PM  Susan Esparza  has presented today for surgery, with the diagnosis of OA RIGHT KNEE   The various methods of treatment have been discussed with the patient and family. After consideration of risks, benefits and other options for treatment, the patient has consented to  Procedure(s): TOTAL RIGHT KNEE ARTHROPLASTY (Right) as a surgical intervention .  The patient's history has been reviewed, patient examined, no change in status, stable for surgery.  I have reviewed the patient's chart and labs.  Questions were answered to the patient's satisfaction.     Gearlean Alf

## 2015-02-24 NOTE — Progress Notes (Signed)
Patient declines the use of nocturnal CPAP tonight. RT will continue to follow.  

## 2015-02-24 NOTE — Op Note (Signed)
Pre-operative diagnosis- Osteoarthritis  Right knee(s)  Post-operative diagnosis- Osteoarthritis Right knee(s)  Procedure-  Right  Total Knee Arthroplasty  Surgeon- Susan Plover. Mamta Rimmer, MD  Assistant- Arlee Muslim, PA-C   Anesthesia-  Spinal  EBL-* No blood loss amount entered *   Drains Hemovac  Tourniquet time- 35 minutes @ [id number] mm Hg    Complications- None  Condition-PACU - hemodynamically stable.   Brief Clinical Note  Susan Esparza is a 59 y.o. year old female with end stage OA of her right knee with progressively worsening pain and dysfunction. She has constant pain, with activity and at rest and significant functional deficits with difficulties even with ADLs. She has had extensive non-op management including analgesics, injections of cortisone, and home exercise program, but remains in significant pain with significant dysfunction.Radiographs show bone on bone arthritis lateral and patellofemoral. She presents now for right Total Knee Arthroplasty.    Procedure in detail---   The patient is brought into the operating room and positioned supine on the operating table. After successful administration of  Spinal,   a tourniquet is placed high on the  Right thigh(s) and the lower extremity is prepped and draped in the usual sterile fashion. Time out is performed by the operating team and then the  Right lower extremity is wrapped in Esmarch, knee flexed and the tourniquet inflated to 300 mmHg.       A midline incision is made with a ten blade through the subcutaneous tissue to the level of the extensor mechanism. A fresh blade is used to make a medial parapatellar arthrotomy. Soft tissue over the proximal medial tibia is subperiosteally elevated to the joint line with a knife and into the semimembranosus bursa with a Cobb elevator. Soft tissue over the proximal lateral tibia is elevated with attention being paid to avoiding the patellar tendon on the tibial tubercle. The patella is  everted, knee flexed 90 degrees and the ACL and PCL are removed. Findings are bone on bone lateral and patellofemoral with massive lateral and patellar osteophytes.      The drill is used to create a starting hole in the distal femur and the canal is thoroughly irrigated with sterile saline to remove the fatty contents. The 5 degree Right  valgus alignment guide is placed into the femoral canal and the distal femoral cutting block is pinned to remove 10 mm off the distal femur. Resection is made with an oscillating saw.      The tibia is subluxed forward and the menisci are removed. The extramedullary alignment guide is placed referencing proximally at the medial aspect of the tibial tubercle and distally along the second metatarsal axis and tibial crest. The block is pinned to remove 23mm off the more deficient lateral  side. Resection is made with an oscillating saw. Size 5is the most appropriate size for the tibia and the proximal tibia is prepared with the modular drill and keel punch for that size.      The femoral sizing guide is placed and size 6 is most appropriate. Rotation is marked off the epicondylar axis and confirmed by creating a rectangular flexion gap at 90 degrees. The size 6 cutting block is pinned in this rotation and the anterior, posterior and chamfer cuts are made with the oscillating saw. The intercondylar block is then placed and that cut is made.      Trial size 5 tibial component, trial size 6 narrow posterior stabilized femur and a 6  mm posterior stabilized rotating  platform insert trial is placed. Full extension is achieved with excellent varus/valgus and anterior/posterior balance throughout full range of motion. The patella is everted and thickness measured to be 22  mm. Free hand resection is taken to 12 mm, a 35 template is placed, lug holes are drilled, trial patella is placed, and it tracks normally. Osteophytes are removed off the posterior femur with the trial in place. All  trials are removed and the cut bone surfaces prepared with pulsatile lavage. Cement is mixed and once ready for implantation, the size 5 tibial implant, size  6 narrow posterior stabilized femoral component, and the size 35 patella are cemented in place and the patella is held with the clamp. The trial insert is placed and the knee held in full extension. The Exparel (20 ml mixed with 30 ml saline) and .25% Bupivicaine, are injected into the extensor mechanism, posterior capsule, medial and lateral gutters and subcutaneous tissues.  All extruded cement is removed and once the cement is hard the permanent 6 mm posterior stabilized rotating platform insert is placed into the tibial tray.      The wound is copiously irrigated with saline solution and the extensor mechanism closed over a hemovac drain with #1 V-loc suture. The tourniquet is released for a total tourniquet time of 35  minutes. Flexion against gravity is 140 degrees and the patella tracks normally. Subcutaneous tissue is closed with 2.0 vicryl and subcuticular with running 4.0 Monocryl. The incision is cleaned and dried and steri-strips and a bulky sterile dressing are applied. The limb is placed into a knee immobilizer and the patient is awakened and transported to recovery in stable condition.      Please note that a surgical assistant was a medical necessity for this procedure in order to perform it in a safe and expeditious manner. Surgical assistant was necessary to retract the ligaments and vital neurovascular structures to prevent injury to them and also necessary for proper positioning of the limb to allow for anatomic placement of the prosthesis.   Susan Plover Susan Cush, MD    02/24/2015, 3:49 PM

## 2015-02-24 NOTE — Anesthesia Procedure Notes (Signed)
Spinal Patient location during procedure: OR Start time: 02/24/2015 2:34 PM End time: 02/24/2015 2:39 PM Staffing Resident/CRNA: Herbie Drape J Performed by: resident/CRNA  Preanesthetic Checklist Completed: patient identified, site marked, surgical consent, pre-op evaluation, timeout performed, IV checked, risks and benefits discussed and monitors and equipment checked Spinal Block Patient position: sitting Prep: Betadine and site prepped and draped Patient monitoring: heart rate, continuous pulse ox and blood pressure Approach: midline Location: L4-5 Injection technique: single-shot Needle Needle type: Sprotte  Needle gauge: 24 G Needle length: 9 cm Needle insertion depth: 6 cm

## 2015-02-25 ENCOUNTER — Encounter (HOSPITAL_COMMUNITY): Payer: Self-pay | Admitting: Orthopedic Surgery

## 2015-02-25 LAB — CBC
HEMATOCRIT: 36.1 % (ref 36.0–46.0)
HEMOGLOBIN: 12.1 g/dL (ref 12.0–15.0)
MCH: 30.5 pg (ref 26.0–34.0)
MCHC: 33.5 g/dL (ref 30.0–36.0)
MCV: 90.9 fL (ref 78.0–100.0)
Platelets: 211 10*3/uL (ref 150–400)
RBC: 3.97 MIL/uL (ref 3.87–5.11)
RDW: 13.3 % (ref 11.5–15.5)
WBC: 11.6 10*3/uL — ABNORMAL HIGH (ref 4.0–10.5)

## 2015-02-25 LAB — BASIC METABOLIC PANEL
ANION GAP: 7 (ref 5–15)
BUN: 8 mg/dL (ref 6–20)
CALCIUM: 9 mg/dL (ref 8.9–10.3)
CHLORIDE: 103 mmol/L (ref 101–111)
CO2: 26 mmol/L (ref 22–32)
Creatinine, Ser: 0.58 mg/dL (ref 0.44–1.00)
GFR calc non Af Amer: >60 mL/min (ref >60)
Glucose, Bld: 179 mg/dL — ABNORMAL HIGH (ref 65–99)
POTASSIUM: 4.1 mmol/L (ref 3.5–5.1)
Sodium: 136 mmol/L (ref 135–145)

## 2015-02-25 MED ORDER — RIVAROXABAN 10 MG PO TABS: 10.0000 mg | ORAL_TABLET | Freq: Every day | ORAL | Status: DC

## 2015-02-25 MED ORDER — OXYCODONE HCL 5 MG PO TABS: 5.0000 mg | ORAL_TABLET | ORAL | Status: DC | PRN

## 2015-02-25 MED ORDER — METHOCARBAMOL 500 MG PO TABS: 500.0000 mg | ORAL_TABLET | Freq: Four times a day (QID) | ORAL | Status: DC | PRN

## 2015-02-25 NOTE — Discharge Instructions (Addendum)
° °Dr. Frank Aluisio °Total Joint Specialist °[facility] °[address] °[place], [place] [address] °[phone] ° °TOTAL KNEE REPLACEMENT POSTOPERATIVE DIRECTIONS ° °Knee Rehabilitation, Guidelines Following Surgery  °Results after knee surgery are often greatly improved when you follow the exercise, range of motion and muscle strengthening exercises prescribed by your doctor. Safety measures are also important to protect the knee from further injury. Any time any of these exercises cause you to have increased pain or swelling in your knee joint, decrease the amount until you are comfortable again and slowly increase them. If you have problems or questions, call your caregiver or physical therapist for advice.  ° °HOME CARE INSTRUCTIONS  °Remove items at home which could result in a fall. This includes throw rugs or furniture in walking pathways.  °· ICE to the affected knee every three hours for 30 minutes at a time and then as needed for pain and swelling.  Continue to use ice on the knee for pain and swelling from surgery. You may notice swelling that will progress down to the foot and ankle.  This is normal after surgery.  Elevate the leg when you are not up walking on it.   °· Continue to use the breathing machine which will help keep your temperature down.  It is common for your temperature to cycle up and down following surgery, especially at night when you are not up moving around and exerting yourself.  The breathing machine keeps your lungs expanded and your temperature down. °· Do not place pillow under knee, focus on keeping the knee straight while resting ° °DIET °You may resume your previous home diet once your are discharged from the hospital. ° °DRESSING / WOUND CARE / SHOWERING °You may shower 3 days after surgery, but keep the wounds dry during showering.  You may use an occlusive plastic wrap (Press'n Seal for example), NO SOAKING/SUBMERGING IN THE BATHTUB.  If the  bandage gets wet, change with a clean dry gauze.  If the incision gets wet, pat the wound dry with a clean towel. °You may start showering once you are discharged home but do not submerge the incision under water. Just pat the incision dry and apply a dry gauze dressing on daily. °Change the surgical dressing daily and reapply a dry dressing each time. ° °ACTIVITY °Walk with your walker as instructed. °Use walker as long as suggested by your caregivers. °Avoid periods of inactivity such as sitting longer than an hour when not asleep. This helps prevent blood clots.  °You may resume a sexual relationship in one month or when given the OK by your doctor.  °You may return to work once you are cleared by your doctor.  °Do not drive a car for 6 weeks or until released by you surgeon.  °Do not drive while taking narcotics. ° °WEIGHT BEARING °Weight bearing as tolerated with assist device (walker, cane, etc) as directed, use it as long as suggested by your surgeon or therapist, typically at least 4-6 weeks. ° °POSTOPERATIVE CONSTIPATION PROTOCOL °Constipation - defined medically as fewer than three stools per week and severe constipation as less than one stool per week. ° °One of the most common issues patients have following surgery is constipation.  Even if you have a regular bowel pattern at home, your normal regimen is likely to be disrupted due to multiple reasons following surgery.  Combination of anesthesia, postoperative narcotics, change in appetite and fluid intake all can affect your bowels.    In order to avoid complications following surgery, here are some recommendations in order to help you during your recovery period. ° °Colace (docusate) - Pick up an over-the-counter form of Colace or another stool softener and take twice a day as long as you are requiring postoperative pain medications.  Take with a full glass of water daily.  If you experience loose stools or diarrhea, hold the colace until you stool forms  back up.  If your symptoms do not get better within 1 week or if they get worse, check with your doctor. ° °Dulcolax (bisacodyl) - Pick up over-the-counter and take as directed by the product packaging as needed to assist with the movement of your bowels.  Take with a full glass of water.  Use this product as needed if not relieved by Colace only.  ° °MiraLax (polyethylene glycol) - Pick up over-the-counter to have on hand.  MiraLax is a solution that will increase the amount of water in your bowels to assist with bowel movements.  Take as directed and can mix with a glass of water, juice, soda, coffee, or tea.  Take if you go more than two days without a movement. °Do not use MiraLax more than once per day. Call your doctor if you are still constipated or irregular after using this medication for 7 days in a row. ° °If you continue to have problems with postoperative constipation, please contact the office for further assistance and recommendations.  If you experience "the worst abdominal pain ever" or develop nausea or vomiting, please contact the office immediatly for further recommendations for treatment. ° °ITCHING ° If you experience itching with your medications, try taking only a single pain pill, or even half a pain pill at a time.  You can also use Benadryl over the counter for itching or also to help with sleep.  ° °TED HOSE STOCKINGS °Wear the elastic stockings on both legs for three weeks following surgery during the day but you may remove then at night for sleeping. ° °MEDICATIONS °See your medication summary on the “After Visit Summary” that the nursing staff will review with you prior to discharge.  You may have some home medications which will be placed on hold until you complete the course of blood thinner medication.  It is important for you to complete the blood thinner medication as prescribed by your surgeon.  Continue your approved medications as instructed at time of  discharge. ° °PRECAUTIONS °If you experience chest pain or shortness of breath - call 911 immediately for transfer to the hospital emergency department.  °If you develop a fever greater that 101 F, purulent drainage from wound, increased redness or drainage from wound, foul odor from the wound/dressing, or calf pain - CONTACT YOUR SURGEON.   °                                                °FOLLOW-UP APPOINTMENTS °Make sure you keep all of your appointments after your operation with your surgeon and caregivers. You should call the office at the above phone number and make an appointment for approximately two weeks after the date of your surgery or on the date instructed by your surgeon outlined in the "After Visit Summary". ° ° °RANGE OF MOTION AND STRENGTHENING EXERCISES  °Rehabilitation of the knee is important following a knee injury or   an operation. After just a few days of immobilization, the muscles of the thigh which control the knee become weakened and shrink (atrophy). Knee exercises are designed to build up the tone and strength of the thigh muscles and to improve knee motion. Often times heat used for twenty to thirty minutes before working out will loosen up your tissues and help with improving the range of motion but do not use heat for the first two weeks following surgery. These exercises can be done on a training (exercise) mat, on the floor, on a table or on a bed. Use what ever works the best and is most comfortable for you Knee exercises include:  °Leg Lifts - While your knee is still immobilized in a splint or cast, you can do straight leg raises. Lift the leg to 60 degrees, hold for 3 sec, and slowly lower the leg. Repeat 10-20 times 2-3 times daily. Perform this exercise against resistance later as your knee gets better.  °Quad and Hamstring Sets - Tighten up the muscle on the front of the thigh (Quad) and hold for 5-10 sec. Repeat this 10-20 times hourly. Hamstring sets are done by pushing the  foot backward against an object and holding for 5-10 sec. Repeat as with quad sets.  °· Leg Slides: Lying on your back, slowly slide your foot toward your buttocks, bending your knee up off the floor (only go as far as is comfortable). Then slowly slide your foot back down until your leg is flat on the floor again. °· Angel Wings: Lying on your back spread your legs to the side as far apart as you can without causing discomfort.  °A rehabilitation program following serious knee injuries can speed recovery and prevent re-injury in the future due to weakened muscles. Contact your doctor or a physical therapist for more information on knee rehabilitation.  ° °IF YOU ARE TRANSFERRED TO A SKILLED REHAB FACILITY °If the patient is transferred to a skilled rehab facility following release from the hospital, a list of the current medications will be sent to the facility for the patient to continue.  When discharged from the skilled rehab facility, please have the facility set up the patient's Home Health Physical Therapy prior to being released. Also, the skilled facility will be responsible for providing the patient with their medications at time of release from the facility to include their pain medication, the muscle relaxants, and their blood thinner medication. If the patient is still at the rehab facility at time of the two week follow up appointment, the skilled rehab facility will also need to assist the patient in arranging follow up appointment in our office and any transportation needs. ° °MAKE SURE YOU:  °Understand these instructions.  °Get help right away if you are not doing well or get worse.  ° ° °Pick up stool softner and laxative for home use following surgery while on pain medications. °Do not submerge incision under water. °Please use good hand washing techniques while changing dressing each day. °May shower starting three days after surgery. °Please use a clean towel to pat the incision dry following  showers. °Continue to use ice for pain and swelling after surgery. °Do not use any lotions or creams on the incision until instructed by your surgeon. ° °Take Xarelto for two and a half more weeks, then discontinue Xarelto. °Once the patient has completed the blood thinner regimen, then take a Baby 81 mg Aspirin daily for three more weeks. ° ° ° °  on my medicine - XARELTO® (Rivaroxaban) ° °This medication education was reviewed with me or my healthcare representative as part of my discharge preparation.  The pharmacist that spoke with me during my hospital stay was:  Summe, Colleen E, RPH ° °Why was Xarelto® prescribed for you? °Xarelto® was prescribed for you to reduce the risk of blood clots forming after orthopedic surgery. The medical term for these abnormal blood clots is venous thromboembolism (VTE). ° °What do you need to know about xarelto® ? °Take your Xarelto® ONCE DAILY at the same time every day. °You may take it either with or without food. ° °If you have difficulty swallowing the tablet whole, you may crush it and mix in applesauce just prior to taking your dose. ° °Take Xarelto® exactly as prescribed by your doctor and DO NOT stop taking Xarelto® without talking to the doctor who prescribed the medication.  Stopping without other VTE prevention medication to take the place of Xarelto® may increase your risk of developing a clot. ° °After discharge, you should have regular check-up appointments with your healthcare provider that is prescribing your Xarelto®.   ° °What do you do if you miss a dose? °If you miss a dose, take it as soon as you remember on the same day then continue your regularly scheduled once daily regimen the next day. Do not take two doses of Xarelto® on the same day.  ° °Important Safety Information °A possible side effect of Xarelto® is bleeding. You should call your healthcare provider right away if you experience any of the following: °? Bleeding from an injury or your  nose that does not stop. °? Unusual colored urine (red or dark brown) or unusual colored stools (red or black). °? Unusual bruising for unknown reasons. °? A serious fall or if you hit your head (even if there is no bleeding). ° °Some medicines may interact with Xarelto® and might increase your risk of bleeding while on Xarelto®. To help avoid this, consult your healthcare provider or pharmacist prior to using any new prescription or non-prescription medications, including herbals, vitamins, non-steroidal anti-inflammatory drugs (NSAIDs) and supplements. ° °This website has more information on Xarelto®: [email or web address].com. ° ° ° °

## 2015-02-25 NOTE — Evaluation (Signed)
Physical Therapy Evaluation Patient Details Name: Susan Esparza MRN: [id number] DOB: 09-02-1955 Today's Date: 02/25/2015   History of Present Illness  59 yo female s/p R TKA 02/24/15. Hx of ADD.  Clinical Impression  On eval, pt required Min assist for mobility-walked ~100 feet with RW. Pain rated 5/10 with activity. Pt tolerated session well.     Follow Up Recommendations Home health PT    Equipment Recommendations  Rolling walker with 5" wheels    Recommendations for Other Services       Precautions / Restrictions Precautions Precautions: Knee Required Braces or Orthoses: Knee Immobilizer - Right Knee Immobilizer - Right: Discontinue once straight leg raise with < 10 degree lag Restrictions Weight Bearing Restrictions: No RLE Weight Bearing: Weight bearing as tolerated      Mobility  Bed Mobility Overal bed mobility: Needs Assistance Bed Mobility: Supine to Sit     Supine to sit: Supervision;HOB elevated        Transfers Overall transfer level: Needs assistance Equipment used: Rolling walker (2 wheeled) Transfers: Sit to/from Stand Sit to Stand: Min guard         General transfer comment: close guard for safety. VCs safety, hand placement  Ambulation/Gait Ambulation/Gait assistance: Min guard Ambulation Distance (Feet): 100 Feet Assistive device: Rolling walker (2 wheeled) Gait Pattern/deviations: Step-to pattern;Antalgic     General Gait Details: close guard for safety. VCs safety, sequence.  Stairs            Wheelchair Mobility    Modified Rankin (Stroke Patients Only)       Balance Overall balance assessment: Needs assistance         Standing balance support: During functional activity Standing balance-Leahy Scale: Fair                               Pertinent Vitals/Pain Pain Assessment: 0-10 Pain Score: 4  Pain Location: R knee Pain Descriptors / Indicators: Sore;Aching Pain Intervention(s): Monitored  during session;Ice applied;Repositioned    Home Living Family/patient expects to be discharged to:: Private residence Living Arrangements: Spouse/significant other   Type of Home: House Home Access: Stairs to enter Entrance Stairs-Rails: Right Entrance Stairs-Number of Steps: 3 Home Layout: Two level;Able to live on main level with bedroom/bathroom;Bed/bath upstairs (pt is unsure if she will sleep upstairs or downstairs) Home Equipment: Crutches (hiking stick)      Prior Function Level of Independence: Independent               Hand Dominance        Extremity/Trunk Assessment   Upper Extremity Assessment: Overall WFL for tasks assessed           Lower Extremity Assessment: RLE deficits/detail RLE Deficits / Details: hip flex 3/5, hip abd/add 3/5, moves ankle well    Cervical / Trunk Assessment: Normal  Communication   Communication: No difficulties  Cognition Arousal/Alertness: Awake/alert Behavior During Therapy: WFL for tasks assessed/performed Overall Cognitive Status: Within Functional Limits for tasks assessed                      General Comments      Exercises Total Joint Exercises Ankle Circles/Pumps: AROM;Both;10 reps;Supine Quad Sets: AROM;Both;10 reps;Supine Heel Slides: AAROM;Right;10 reps;Supine Hip ABduction/ADduction: AROM;AAROM;Right;10 reps;Supine Straight Leg Raises: AROM;Right;10 reps;Supine Goniometric ROM: ~10-55 degrees      Assessment/Plan    PT Assessment Patient needs continued PT services  PT Diagnosis Difficulty walking;Acute pain  PT Problem List Decreased strength;Decreased range of motion;Decreased activity tolerance;Decreased balance;Decreased mobility;Decreased knowledge of use of DME;Pain  PT Treatment Interventions DME instruction;Gait training;Stair training;Functional mobility training;Therapeutic activities;Patient/family education;Balance training;Therapeutic exercise   PT Goals (Current goals can be  found in the Care Plan section) Acute Rehab PT Goals Patient Stated Goal: to be as independent as possible; to regain PLOF PT Goal Formulation: With patient/family Time For Goal Achievement: 03/04/15 Potential to Achieve Goals: Good    Frequency 7X/week   Barriers to discharge        Co-evaluation               End of Session Equipment Utilized During Treatment: Gait belt;Right knee immobilizer Activity Tolerance: Patient tolerated treatment well Patient left: in chair;with call bell/phone within reach;with chair alarm set;with family/visitor present           Time: [id number] PT Time Calculation (min) (ACUTE ONLY): 26 min   Charges:   PT Evaluation $Initial PT Evaluation Tier I: 1 Procedure PT Treatments $Gait Training: 8-22 mins   PT G Codes:        Weston Anna, MPT Pager: [phone]

## 2015-02-25 NOTE — Progress Notes (Signed)
Physical Therapy Treatment Patient Details Name: Susan Esparza MRN: [id number] DOB: 26-Sep-1955 Today's Date: 02/25/2015    History of Present Illness 59 yo female s/p R TKA 02/24/15. Hx of ADD.    PT Comments    Progressing well with mobility.   Follow Up Recommendations  Home health PT     Equipment Recommendations  Rolling walker with 5" wheels    Recommendations for Other Services       Precautions / Restrictions Precautions Precautions: Knee Required Braces or Orthoses: Knee Immobilizer - Right Knee Immobilizer - Right: Discontinue once straight leg raise with < 10 degree lag Restrictions Weight Bearing Restrictions: No RLE Weight Bearing: Weight bearing as tolerated    Mobility  Bed Mobility Overal bed mobility: Needs Assistance Bed Mobility: Supine to Sit;Sit to Supine     Supine to sit: Supervision;HOB elevated Sit to supine: Supervision;HOB elevated   General bed mobility comments: Pt used good leg to hook R LE.   Transfers Overall transfer level: Needs assistance Equipment used: Rolling walker (2 wheeled) Transfers: Sit to/from Stand Sit to Stand: Supervision         General transfer comment: for safety.  VCs safety, hand placement  Ambulation/Gait Ambulation/Gait assistance: Min guard Ambulation Distance (Feet): 125 Feet Assistive device: Rolling walker (2 wheeled) Gait Pattern/deviations: Step-to pattern;Step-through pattern;Decreased stride length     General Gait Details: close guard for safety. VCs safety, sequence. Less stable when using reciprocal gait pattern   Stairs            Wheelchair Mobility    Modified Rankin (Stroke Patients Only)       Balance                                    Cognition Arousal/Alertness: Awake/alert Behavior During Therapy: WFL for tasks assessed/performed Overall Cognitive Status: Within Functional Limits for tasks assessed                      Exercises       General Comments        Pertinent Vitals/Pain Pain Assessment: 0-10 Pain Score: 5  Pain Location: R knee Pain Descriptors / Indicators: Sore Pain Intervention(s): Monitored during session;Ice applied;Repositioned    Home Living Family/patient expects to be discharged to:: Private residence Living Arrangements: Spouse/significant other   Type of Home: House Home Access: Stairs to enter Entrance Stairs-Rails: Right Home Layout: Two level;Able to live on main level with bedroom/bathroom;Bed/bath upstairs (pt is unsure if she will sleep upstairs or downstairs) Home Equipment: Crutches (hiking stick)      Prior Function Level of Independence: Independent          PT Goals (current goals can now be found in the care plan section) Acute Rehab PT Goals Patient Stated Goal: to be as independent as possible; to regain PLOF Progress towards PT goals: Progressing toward goals    Frequency  7X/week    PT Plan Current plan remains appropriate    Co-evaluation             End of Session Equipment Utilized During Treatment: Gait belt;Right knee immobilizer Activity Tolerance: Patient tolerated treatment well Patient left: in bed;with bed alarm set;with family/visitor present     Time: 1430-1450 PT Time Calculation (min) (ACUTE ONLY): 20 min  Charges:  $Gait Training: 8-22 mins  G Codes:      Weston Anna, MPT Pager: [phone]

## 2015-02-25 NOTE — Discharge Summary (Signed)
Physician Discharge Summary   Patient ID: Susan Esparza MRN: [id number] DOB/AGE: [birth year] 59 y.o.  Admit date: 02/24/2015 Discharge date: 02-26-2015  Primary Diagnosis:  Osteoarthritis Right knee(s) Admission Diagnoses:  Past Medical History  Diagnosis Date  . Allergic rhinitis   . Depression   . DVT (deep vein thrombosis) in pregnancy 2009    left leg after knee Arthroscopy  . Anxiety   . Numbness     rt hand  . Arthritis   . Sleep apnea     "borderline"  uses c-pap  . Chronic dryness of both eyes   . Dysrhythmia     HX of "[place]"   Discharge Diagnoses:   Principal Problem:   OA (osteoarthritis) of knee  Estimated body mass index is 28.42 kg/(m^2) as calculated from the following:   Height as of this encounter: 5' 6"  (1.676 m).   Weight as of this encounter: 79.833 kg (176 lb).  Procedure:  Procedure(s) (LRB): TOTAL RIGHT KNEE ARTHROPLASTY (Right)   Consults: None  HPI: Susan Esparza is a 59 y.o. year old female with end stage OA of her right knee with progressively worsening pain and dysfunction. She has constant pain, with activity and at rest and significant functional deficits with difficulties even with ADLs. She has had extensive non-op management including analgesics, injections of cortisone, and home exercise program, but remains in significant pain with significant dysfunction.Radiographs show bone on bone arthritis lateral and patellofemoral. She presents now for right Total Knee Arthroplasty. Laboratory Data: Admission on 02/24/2015  Component Date Value Ref Range Status  . WBC 02/25/2015 11.6* 4.0 - 10.5 K/uL Final  . RBC 02/25/2015 3.97  3.87 - 5.11 MIL/uL Final  . Hemoglobin 02/25/2015 12.1  12.0 - 15.0 g/dL Final  . HCT 02/25/2015 36.1  36.0 - 46.0 % Final  . MCV 02/25/2015 90.9  78.0 - 100.0 fL Final  . MCH 02/25/2015 30.5  26.0 - 34.0 pg Final  . MCHC 02/25/2015 33.5  30.0 - 36.0 g/dL Final  . RDW 02/25/2015 13.3  11.5 - 15.5 % Final    . Platelets 02/25/2015 211  150 - 400 K/uL Final  . Sodium 02/25/2015 136  135 - 145 mmol/L Final  . Potassium 02/25/2015 4.1  3.5 - 5.1 mmol/L Final  . Chloride 02/25/2015 103  101 - 111 mmol/L Final  . CO2 02/25/2015 26  22 - 32 mmol/L Final  . Glucose, Bld 02/25/2015 179* 65 - 99 mg/dL Final  . BUN 02/25/2015 8  6 - 20 mg/dL Final  . Creatinine, Ser 02/25/2015 0.58  0.44 - 1.00 mg/dL Final  . Calcium 02/25/2015 9.0  8.9 - 10.3 mg/dL Final  . GFR calc non Af Amer 02/25/2015 >60  >60 mL/min Final  . GFR calc Af Amer 02/25/2015 >60  >60 mL/min Final   Comment: (NOTE) The eGFR has been calculated using the CKD EPI equation. This calculation has not been validated in all clinical situations. eGFR's persistently <60 mL/min signify possible Chronic Kidney Disease.   Georgiann Hahn gap 02/25/2015 7  5 - 15 Final  Hospital Outpatient Visit on 02/17/2015  Component Date Value Ref Range Status  . aPTT 02/17/2015 30  24 - 37 seconds Final  . WBC 02/17/2015 5.9  4.0 - 10.5 K/uL Final  . RBC 02/17/2015 4.51  3.87 - 5.11 MIL/uL Final  . Hemoglobin 02/17/2015 13.8  12.0 - 15.0 g/dL Final  . HCT 02/17/2015 40.7  36.0 - 46.0 % Final  . MCV 02/17/2015 90.2  78.0 - 100.0 fL Final  . MCH 02/17/2015 30.6  26.0 - 34.0 pg Final  . MCHC 02/17/2015 33.9  30.0 - 36.0 g/dL Final  . RDW 02/17/2015 13.6  11.5 - 15.5 % Final  . Platelets 02/17/2015 224  150 - 400 K/uL Final  . Sodium 02/17/2015 139  135 - 145 mmol/L Final  . Potassium 02/17/2015 4.2  3.5 - 5.1 mmol/L Final  . Chloride 02/17/2015 107  101 - 111 mmol/L Final  . CO2 02/17/2015 23  22 - 32 mmol/L Final  . Glucose, Bld 02/17/2015 91  65 - 99 mg/dL Final  . BUN 02/17/2015 13  6 - 20 mg/dL Final  . Creatinine, Ser 02/17/2015 0.54  0.44 - 1.00 mg/dL Final  . Calcium 02/17/2015 9.5  8.9 - 10.3 mg/dL Final  . Total Protein 02/17/2015 7.5  6.5 - 8.1 g/dL Final  . Albumin 02/17/2015 4.4  3.5 - 5.0 g/dL Final  . AST 02/17/2015 27  15 - 41 U/L Final  .  ALT 02/17/2015 21  14 - 54 U/L Final  . Alkaline Phosphatase 02/17/2015 63  38 - 126 U/L Final  . Total Bilirubin 02/17/2015 0.6  0.3 - 1.2 mg/dL Final  . GFR calc non Af Amer 02/17/2015 >60  >60 mL/min Final  . GFR calc Af Amer 02/17/2015 >60  >60 mL/min Final   Comment: (NOTE) The eGFR has been calculated using the CKD EPI equation. This calculation has not been validated in all clinical situations. eGFR's persistently <60 mL/min signify possible Chronic Kidney Disease.   . Anion gap 02/17/2015 9  5 - 15 Final  . Prothrombin Time 02/17/2015 12.9  11.6 - 15.2 seconds Final  . INR 02/17/2015 0.96  0.00 - 1.49 Final  . ABO/RH(D) 02/17/2015 O NEG   Final  . Antibody Screen 02/17/2015 NEG   Final  . Sample Expiration 02/17/2015 02/27/2015   Final  . Extend sample reason 02/17/2015 NO TRANSFUSIONS OR PREGNANCY IN THE PAST 3 MONTHS   Final  . Color, Urine 02/17/2015 YELLOW  YELLOW Final  . APPearance 02/17/2015 CLEAR  CLEAR Final  . Specific Gravity, Urine 02/17/2015 1.005  1.005 - 1.030 Final  . pH 02/17/2015 6.5  5.0 - 8.0 Final  . Glucose, UA 02/17/2015 NEGATIVE  NEGATIVE mg/dL Final  . Hgb urine dipstick 02/17/2015 NEGATIVE  NEGATIVE Final  . Bilirubin Urine 02/17/2015 NEGATIVE  NEGATIVE Final  . Ketones, ur 02/17/2015 NEGATIVE  NEGATIVE mg/dL Final  . Protein, ur 02/17/2015 NEGATIVE  NEGATIVE mg/dL Final  . Urobilinogen, UA 02/17/2015 0.2  0.0 - 1.0 mg/dL Final  . Nitrite 02/17/2015 NEGATIVE  NEGATIVE Final  . Leukocytes, UA 02/17/2015 NEGATIVE  NEGATIVE Final   MICROSCOPIC NOT DONE ON URINES WITH NEGATIVE PROTEIN, BLOOD, LEUKOCYTES, NITRITE, OR GLUCOSE <1000 mg/dL.  Marland Kitchen MRSA, PCR 02/17/2015 NEGATIVE  NEGATIVE Final  . Staphylococcus aureus 02/17/2015 NEGATIVE  NEGATIVE Final   Comment:        The Xpert SA Assay (FDA approved for NASAL specimens in patients over 44 years of age), is one component of a comprehensive surveillance program.  Test performance has been validated by  [facility] for patients greater than or equal to 5 year old. It is not intended to diagnose infection nor to guide or monitor treatment.   . ABO/RH(D) 02/17/2015 O NEG   Final  Office Visit on 01/28/2015  Component Date Value Ref Range Status  . Glucose, UA 01/28/2015 n   Final  . Bilirubin, UA  01/28/2015 n   Final  . Ketones, UA 01/28/2015 trace   Final  . Blood, UA 01/28/2015 n   Final  . pH, UA 01/28/2015 5.0   Final  . Protein, UA 01/28/2015 n   Final  . Urobilinogen, UA 01/28/2015 negative   Final  . Nitrite, UA 01/28/2015 n   Final  . Leukocytes, UA 01/28/2015 Trace* Negative Final  . Hemoglobin, fingerstick 01/28/2015 13.3  12.0 - 16.0 g/dL Final  . Total Bilirubin 01/28/2015 0.7  0.2 - 1.2 mg/dL Final  . Bilirubin, Direct 01/28/2015 0.1  <=0.2 mg/dL Final  . Indirect Bilirubin 01/28/2015 0.6  0.2 - 1.2 mg/dL Final  . Alkaline Phosphatase 01/28/2015 57  33 - 130 U/L Final  . AST 01/28/2015 20  10 - 35 U/L Final  . ALT 01/28/2015 22  6 - 29 U/L Final  . Total Protein 01/28/2015 6.8  6.1 - 8.1 g/dL Final  . Albumin 01/28/2015 4.5  3.6 - 5.1 g/dL Final  . HCV Ab 01/28/2015 NEGATIVE  NEGATIVE Final     X-Rays:Us Transvaginal Non-ob  02/20/2015  SEE PROGRESS NOTE   EKG: Orders placed or performed during the hospital encounter of 02/17/15  . EKG 12-Lead  . EKG 12-Lead     Hospital Course: Susan Esparza is a 59 y.o. who was admitted to [facility]. They were brought to the operating room on 02/24/2015 and underwent Procedure(s): TOTAL RIGHT KNEE ARTHROPLASTY.  Patient tolerated the procedure well and was later transferred to the recovery room and then to the orthopaedic floor for postoperative care.  They were given PO and IV analgesics for pain control following their surgery.  They were given 24 hours of postoperative antibiotics of  Anti-infectives    Start     Dose/Rate Route Frequency Ordered Stop   02/25/15 0600  ceFAZolin (ANCEF) IVPB 2 g/50 mL premix   Status:  Discontinued     2 g 100 mL/hr over 30 Minutes Intravenous On call to O.R. 02/24/15 1236 02/24/15 1358   02/25/15 0200  vancomycin (VANCOCIN) IVPB 1000 mg/200 mL premix     1,000 mg 200 mL/hr over 60 Minutes Intravenous Every 12 hours 02/24/15 1743 02/25/15 0303   02/24/15 1400  vancomycin (VANCOCIN) IVPB 1000 mg/200 mL premix     1,000 mg 200 mL/hr over 60 Minutes Intravenous  Once 02/24/15 1358 02/24/15 1500     and started on DVT prophylaxis in the form of Xarelto.   PT and OT were ordered for total joint protocol.  Discharge planning consulted to help with postop disposition and equipment needs.  Patient had a good night on the evening of surgery.  They started to get up OOB with therapy on day one. Hemovac drain was pulled without difficulty.  Continued to work with therapy into day two.  Dressing was changed on day two and the incision was healing well. Patient was seen in rounds on day two and was ready to go home.   Diet: Regular diet Activity:WBAT Follow-up:in 2 weeks Disposition - Home Discharged Condition: good   Discharge Instructions    Call MD / Call 911    Complete by:  As directed   If you experience chest pain or shortness of breath, CALL 911 and be transported to the hospital emergency room.  If you develope a fever above 101 F, pus (white drainage) or increased drainage or redness at the wound, or calf pain, call your surgeon's office.     Change dressing  Complete by:  As directed   Change dressing daily with sterile 4 x 4 inch gauze dressing and apply TED hose. Do not submerge the incision under water.     Constipation Prevention    Complete by:  As directed   Drink plenty of fluids.  Prune juice may be helpful.  You may use a stool softener, such as Colace (over the counter) 100 mg twice a day.  Use MiraLax (over the counter) for constipation as needed.     Diet - low sodium heart healthy    Complete by:  As directed      Discharge instructions     Complete by:  As directed   Pick up stool softner and laxative for home use following surgery while on pain medications. Do not submerge incision under water. Please use good hand washing techniques while changing dressing each day. May shower starting three days after surgery. Please use a clean towel to pat the incision dry following showers. Continue to use ice for pain and swelling after surgery. Do not use any lotions or creams on the incision until instructed by your surgeon.  Take Xarelto for two and a half more weeks, then discontinue Xarelto. Once the patient has completed the blood thinner regimen, then take a Baby 81 mg Aspirin daily for three more weeks.  Postoperative Constipation Protocol  Constipation - defined medically as fewer than three stools per week and severe constipation as less than one stool per week.  One of the most common issues patients have following surgery is constipation.  Even if you have a regular bowel pattern at home, your normal regimen is likely to be disrupted due to multiple reasons following surgery.  Combination of anesthesia, postoperative narcotics, change in appetite and fluid intake all can affect your bowels.  In order to avoid complications following surgery, here are some recommendations in order to help you during your recovery period.  Colace (docusate) - Pick up an over-the-counter form of Colace or another stool softener and take twice a day as long as you are requiring postoperative pain medications.  Take with a full glass of water daily.  If you experience loose stools or diarrhea, hold the colace until you stool forms back up.  If your symptoms do not get better within 1 week or if they get worse, check with your doctor.  Dulcolax (bisacodyl) - Pick up over-the-counter and take as directed by the product packaging as needed to assist with the movement of your bowels.  Take with a full glass of water.  Use this product as needed if not  relieved by Colace only.   MiraLax (polyethylene glycol) - Pick up over-the-counter to have on hand.  MiraLax is a solution that will increase the amount of water in your bowels to assist with bowel movements.  Take as directed and can mix with a glass of water, juice, soda, coffee, or tea.  Take if you go more than two days without a movement. Do not use MiraLax more than once per day. Call your doctor if you are still constipated or irregular after using this medication for 7 days in a row.  If you continue to have problems with postoperative constipation, please contact the office for further assistance and recommendations.  If you experience "the worst abdominal pain ever" or develop nausea or vomiting, please contact the office immediatly for further recommendations for treatment.     Do not put a pillow under the knee. Place it  under the heel.    Complete by:  As directed      Do not sit on low chairs, stoools or toilet seats, as it may be difficult to get up from low surfaces    Complete by:  As directed      Driving restrictions    Complete by:  As directed   No driving until released by the physician.     Increase activity slowly as tolerated    Complete by:  As directed      Lifting restrictions    Complete by:  As directed   No lifting until released by the physician.     Patient may shower    Complete by:  As directed   You may shower without a dressing once there is no drainage.  Do not wash over the wound.  If drainage remains, do not shower until drainage stops.     TED hose    Complete by:  As directed   Use stockings (TED hose) for 3 weeks on both leg(s).  You may remove them at night for sleeping.     Weight bearing as tolerated    Complete by:  As directed   Laterality:  right  Extremity:  Lower            Medication List    STOP taking these medications        diclofenac 50 MG tablet  Commonly known as:  CATAFLAM     VITAMIN B-6 PO      TAKE these  medications        FLUoxetine 20 MG tablet  Commonly known as:  PROZAC  TAKE ONE TABLET BY MOUTH ONE TIME DAILY     MAGNESIUM CITRATE PO  Take 2 tablets by mouth at bedtime.     methocarbamol 500 MG tablet  Commonly known as:  ROBAXIN  Take 1 tablet (500 mg total) by mouth every 6 (six) hours as needed for muscle spasms.     oxyCODONE 5 MG immediate release tablet  Commonly known as:  Oxy IR/ROXICODONE  Take 1-2 tablets (5-10 mg total) by mouth every 3 (three) hours as needed for moderate pain or severe pain.     psyllium 95 % Pack  Commonly known as:  HYDROCIL/METAMUCIL  Take 1 packet by mouth daily.     rivaroxaban 10 MG Tabs tablet  Commonly known as:  XARELTO  Take 1 tablet (10 mg total) by mouth daily with breakfast. Take Xarelto for two and a half more weeks, then discontinue Xarelto. Once the patient has completed the blood thinner regimen, then take a Baby 81 mg Aspirin daily for three more weeks.     XIIDRA 5 % Soln  Generic drug:  Lifitegrast  Place 1 drop into both eyes 2 (two) times daily.           Follow-up Information    Follow up with [facility].   Why:  at discharge   Contact information:   [address] [place] [address] [phone]       Follow up with Gearlean Alf, MD On 03/11/2015.   Specialty:  Orthopedic Surgery   Why:  Call office at [phone] to setup appointment with Dr. Wynelle Link on Tuesday 03/11/2015.   Contact information:   [address] [place] [address] [phone]       Signed: Arlee Muslim, PA-C Orthopaedic Surgery 02/25/2015, 9:26 PM

## 2015-02-25 NOTE — Progress Notes (Signed)
   Subjective: 1 Day Post-Op Procedure(s) (LRB): TOTAL RIGHT KNEE ARTHROPLASTY (Right) Patient reports pain as moderate.   Patient seen in rounds with Dr. Wynelle Link.  Not much sleep last night. Patient is well, but has had some minor complaints of pain in the knee, requiring pain medications We will start therapy today.  Plan is to go Home after hospital stay.  Objective: Vital signs in last 24 hours: Temp:  [97.5 F (36.4 C)-98.3 F (36.8 C)] 97.7 F (36.5 C) (11/22 0614) Pulse Rate:  [44-57] 50 (11/22 0614) Resp:  [13-16] 16 (11/22 0614) BP: (113-169)/(50-88) 113/50 mmHg (11/22 0614) SpO2:  [95 %-100 %] 98 % (11/22 0614) Weight:  [79.833 kg (176 lb)] 79.833 kg (176 lb) (11/21 1256)  Intake/Output from previous day:  Intake/Output Summary (Last 24 hours) at 02/25/15 0829 Last data filed at 02/25/15 0615  Gross per 24 hour  Intake 4007.83 ml  Output   5765 ml  Net -1757.17 ml    Intake/Output this shift: UOP about 3800 since MN? Watch pressures - negative fluid levels as per chart.  Labs:  Recent Labs  02/25/15 0430  HGB 12.1    Recent Labs  02/25/15 0430  WBC 11.6*  RBC 3.97  HCT 36.1  PLT 211    Recent Labs  02/25/15 0430  NA 136  K 4.1  CL 103  CO2 26  BUN 8  CREATININE 0.58  GLUCOSE 179*  CALCIUM 9.0   No results for input(s): LABPT, INR in the last 72 hours.  EXAM General - Patient is Alert, Appropriate and Oriented Extremity - Neurovascular intact Sensation intact distally Dorsiflexion/Plantar flexion intact Dressing - dressing C/D/I Motor Function - intact, moving foot and toes well on exam.  Hemovac pulled without difficulty.  Past Medical History  Diagnosis Date  . Allergic rhinitis   . Depression   . DVT (deep vein thrombosis) in pregnancy 2009    left leg after knee Arthroscopy  . Anxiety   . Numbness     rt hand  . Arthritis   . Sleep apnea     "borderline"  uses c-pap  . Chronic dryness of both eyes   . Dysrhythmia     HX of "[place]"    Assessment/Plan: 1 Day Post-Op Procedure(s) (LRB): TOTAL RIGHT KNEE ARTHROPLASTY (Right) Principal Problem:   OA (osteoarthritis) of knee  Estimated body mass index is 28.42 kg/(m^2) as calculated from the following:   Height as of this encounter: 5\' 6"  (1.676 m).   Weight as of this encounter: 79.833 kg (176 lb). Advance diet Up with therapy Discharge home with home health  DVT Prophylaxis - Xarelto Weight-Bearing as tolerated to right leg D/C O2 and Pulse OX and try on Room Air  Arlee Muslim, PA-C Orthopaedic Surgery 02/25/2015, 8:29 AM

## 2015-02-25 NOTE — Progress Notes (Signed)
Patient continues to decline nocturnal CPAP. Order discontinued per RT protocol. Patient is aware that she may request CPAP if wishes and becomes more compliant.

## 2015-02-25 NOTE — Progress Notes (Signed)
Pt selected [facility] for HHPT, will need orders for HHPT please. Referral given to in house rep with [place].

## 2015-02-25 NOTE — Care Management Note (Signed)
Case Management Note  Patient Details  Name: Susan Esparza MRN: [id number] Date of Birth: 1955/06/08  Subjective/Objective:  OA of knee                  Action/Plan: Home with [facility]   Expected Discharge Date:                  Expected Discharge Plan:  [place]  In-House Referral:     Discharge planning Services  CM Consult  Post Acute Care Choice:    Choice offered to:     DME Arranged:  3-N-1, Walker rolling DME Agency:  [place]:  PT [facility] Agency:  [place]  Status of Service:  In process, will continue to follow  Medicare Important Message Given:    Date Medicare IM Given:    Medicare IM give by:    Date Additional Medicare IM Given:    Additional Medicare Important Message give by:     If discussed at [place] of Stay Meetings, dates discussed:    Additional CommentsPurcell Mouton, RN 02/25/2015, 11:54 AM

## 2015-02-25 NOTE — Evaluation (Signed)
Occupational Therapy Evaluation Patient Details Name: Susan Esparza MRN: [id number] DOB: [birth year] Today's Date: 02/25/2015    History of Present Illness 59 yo female s/p R TKA 02/24/15. Hx of ADD.   Clinical Impression   Occupational Therapy education complete.  Husband will a as needed    Follow Up Recommendations  No OT follow up    Equipment Recommendations  None recommended by OT    Recommendations for Other Services       Precautions / Restrictions Precautions Precautions: Knee Required Braces or Orthoses: Knee Immobilizer - Right Knee Immobilizer - Right: Discontinue once straight leg raise with < 10 degree lag Restrictions Weight Bearing Restrictions: No RLE Weight Bearing: Weight bearing as tolerated      Mobility Bed Mobility Overal bed mobility: Needs Assistance Bed Mobility: Supine to Sit     Supine to sit: Supervision;HOB elevated     General bed mobility comments: pt in chair  Transfers Overall transfer level: Needs assistance Equipment used: Rolling walker (2 wheeled) Transfers: Sit to/from Stand Sit to Stand: Supervision         General transfer comment: close guard for safety. VCs safety, hand placement    Balance Overall balance assessment: Needs assistance         Standing balance support: During functional activity Standing balance-Leahy Scale: Fair                              ADL Overall ADL's : Needs assistance/impaired                     Lower Body Dressing: Minimal assistance;Sit to/from stand;Cueing for sequencing;Cueing for safety   Toilet Transfer: Supervision/safety;RW;Ambulation;Comfort height toilet   Toileting- Clothing Manipulation and Hygiene: Supervision/safety;Sit to/from Nurse, children's Details (indicate cue type and reason): verbalized safety                   Pertinent Vitals/Pain Pain Assessment: 0-10 Pain Score: 3  Pain Location: r knee Pain  Descriptors / Indicators: Sore Pain Intervention(s): Monitored during session;Ice applied;Repositioned     Hand Dominance     Extremity/Trunk Assessment Upper Extremity Assessment Upper Extremity Assessment: Overall WFL for tasks assessed   Lower Extremity Assessment Lower Extremity Assessment: RLE deficits/detail RLE Deficits / Details: hip flex 3/5, hip abd/add 3/5, moves ankle well   Cervical / Trunk Assessment Cervical / Trunk Assessment: Normal   Communication Communication Communication: No difficulties   Cognition Arousal/Alertness: Awake/alert Behavior During Therapy: WFL for tasks assessed/performed Overall Cognitive Status: Within Functional Limits for tasks assessed                                Home Living Family/patient expects to be discharged to:: Private residence Living Arrangements: Spouse/significant other   Type of Home: House Home Access: Stairs to enter Technical brewer of Steps: 3 Entrance Stairs-Rails: Right Home Layout: Two level;Able to live on main level with bedroom/bathroom;Bed/bath upstairs (pt is unsure if she will sleep upstairs or downstairs) Alternate Level Stairs-Number of Steps: 1 flight Alternate Level Stairs-Rails: Right Bathroom Shower/Tub: Tub/shower unit;Walk-in shower   Bathroom Toilet: Handicapped height     Home Equipment: Crutches (hiking stick)          Prior Functioning/Environment Level of Independence: Independent  OT Goals(Current goals can be found in the care plan section) Acute Rehab OT Goals Patient Stated Goal: to be as independent as possible; to regain PLOF  OT Frequency:                End of Session Nurse Communication: Mobility status  Activity Tolerance: Patient tolerated treatment well Patient left: in chair   Time: 1120-1145 OT Time Calculation (min): 25 min Charges:  OT General Charges $OT Visit: 1 Procedure OT Evaluation $Initial OT  Evaluation Tier I: 1 Procedure OT Treatments $Self Care/Home Management : 8-22 mins G-Codes:    Payton Mccallum D 02/28/2015, 12:10 PM

## 2015-02-25 NOTE — Progress Notes (Signed)
Utilization review completed.  

## 2015-02-26 LAB — BASIC METABOLIC PANEL
ANION GAP: 6 (ref 5–15)
BUN: 11 mg/dL (ref 6–20)
CALCIUM: 8.8 mg/dL — AB (ref 8.9–10.3)
CO2: 29 mmol/L (ref 22–32)
Chloride: 104 mmol/L (ref 101–111)
Creatinine, Ser: 0.58 mg/dL (ref 0.44–1.00)
GFR calc Af Amer: >60 mL/min (ref >60)
GLUCOSE: 133 mg/dL — AB (ref 65–99)
Potassium: 3.9 mmol/L (ref 3.5–5.1)
SODIUM: 139 mmol/L (ref 135–145)

## 2015-02-26 LAB — CBC
HCT: 33.4 % — ABNORMAL LOW (ref 36.0–46.0)
Hemoglobin: 11.2 g/dL — ABNORMAL LOW (ref 12.0–15.0)
MCH: 30.8 pg (ref 26.0–34.0)
MCHC: 33.5 g/dL (ref 30.0–36.0)
MCV: 91.8 fL (ref 78.0–100.0)
PLATELETS: 201 10*3/uL (ref 150–400)
RBC: 3.64 MIL/uL — ABNORMAL LOW (ref 3.87–5.11)
RDW: 13.8 % (ref 11.5–15.5)
WBC: 10.5 10*3/uL (ref 4.0–10.5)

## 2015-02-26 NOTE — Progress Notes (Addendum)
Physical Therapy Treatment Patient Details Name: Margrie Ballard MRN: [id number] DOB: 1955-08-07 Today's Date: 02/26/2015    History of Present Illness 59 yo female s/p R TKA 02/24/15. Hx of ADD.    PT Comments    Progressing with mobility. Pt reports increased soreness and nausea on today. Reviewed exercises, ambulation, and stair negotiation. Issued exercise handout and instructed pt to perform x 3 on tomorrow since HHPT likely will not be out. All education completed.  Follow Up Recommendations  Home health PT     Equipment Recommendations  Rolling walker with 5" wheels    Recommendations for Other Services       Precautions / Restrictions Precautions Precautions: Knee Required Braces or Orthoses: Knee Immobilizer - Right Knee Immobilizer - Right: Discontinue once straight leg raise with < 10 degree lag Restrictions Weight Bearing Restrictions: No RLE Weight Bearing: Weight bearing as tolerated    Mobility  Bed Mobility Overal bed mobility: Needs Assistance Bed Mobility: Supine to Sit     Supine to sit: Supervision     General bed mobility comments: Pt used good leg to hook R LE.   Transfers Overall transfer level: Needs assistance Equipment used: Rolling walker (2 wheeled) Transfers: Sit to/from Stand Sit to Stand: Supervision         General transfer comment: for safety.  VCs safety, hand placement  Ambulation/Gait Ambulation/Gait assistance: Supervision Ambulation Distance (Feet): 185 Feet Assistive device: Rolling walker (2 wheeled) Gait Pattern/deviations: Step-to pattern;Step-through pattern;Decreased stride length     General Gait Details: supervision for safety. slow but steady gait speed.    Stairs Stairs: Yes Stairs assistance: Min assist Stair Management: Step to pattern;Forwards;One rail Right Number of Stairs: 5 General stair comments: 1 HHA, 1 handrail. Up and over portable steps x2. VCs safety, sequence, technique.   Wheelchair  Mobility    Modified Rankin (Stroke Patients Only)       Balance                                    Cognition Arousal/Alertness: Awake/alert Behavior During Therapy: WFL for tasks assessed/performed Overall Cognitive Status: Within Functional Limits for tasks assessed                      Exercises Total Joint Exercises Ankle Circles/Pumps: AROM;Both;10 reps;Supine Quad Sets: AROM;Both;10 reps;Supine Heel Slides: AAROM;Right;10 reps;Supine Hip ABduction/ADduction: AROM;AAROM;Right;10 reps;Supine Straight Leg Raises: AROM;Right;10 reps;Supine Goniometric ROM: ~10-55 degrees    General Comments        Pertinent Vitals/Pain Pain Assessment: 0-10 Pain Score: 6  Pain Location: R knee Pain Descriptors / Indicators: Aching;Sore Pain Intervention(s): Monitored during session;Ice applied;Repositioned    Home Living                      Prior Function            PT Goals (current goals can now be found in the care plan section) Progress towards PT goals: Progressing toward goals    Frequency  7X/week    PT Plan Current plan remains appropriate    Co-evaluation             End of Session Equipment Utilized During Treatment: Gait belt;Right knee immobilizer Activity Tolerance: Patient tolerated treatment well Patient left: in chair;with call bell/phone within reach;with family/visitor present     Time: 0922-0951 PT Time Calculation (min) (ACUTE ONLY): 29 min  Charges:  $Gait Training: 8-22 mins $Therapeutic Exercise: 8-22 mins                    G Codes:      Weston Anna, MPT Pager: [phone]

## 2015-03-20 ENCOUNTER — Ambulatory Visit (INDEPENDENT_AMBULATORY_CARE_PROVIDER_SITE_OTHER): Payer: PRIVATE HEALTH INSURANCE | Admitting: Internal Medicine

## 2015-03-20 ENCOUNTER — Encounter: Payer: Self-pay | Admitting: Internal Medicine

## 2015-03-20 VITALS — BP 136/88 | HR 69 | Temp 98.5°F | Resp 16 | Ht 66.0 in | Wt 173.0 lb

## 2015-03-20 DIAGNOSIS — M1711 Unilateral primary osteoarthritis, right knee: Secondary | ICD-10-CM

## 2015-03-20 DIAGNOSIS — E663 Overweight: Secondary | ICD-10-CM | POA: Insufficient documentation

## 2015-03-20 NOTE — Progress Notes (Signed)
Pre visit review using our clinic review tool, if applicable. No additional management support is needed unless otherwise documented below in the visit note. 

## 2015-03-20 NOTE — Assessment & Plan Note (Signed)
Weight has gone down about 13 pounds since last visit. Congratulated her and she will get back to exercise routinely.

## 2015-03-20 NOTE — Patient Instructions (Signed)
Have a very happy holidays and good luck with the recovery on the knee.  Come back next summer for a physical and call us sooner if you need [place].   Great work on the [employer] loss!

## 2015-03-20 NOTE — Progress Notes (Signed)
   Subjective:    Patient ID: Susan Esparza, female    DOB: 1955-07-14, 59 y.o.   MRN: [id number]  HPI The patient is a 59 YO female coming in for follow up on her weight. She has been working on losing weight. She was exercising more and watching her diet. She is down about 13 pounds since last visit. She had knee replacement about 1 month ago and is recovering well. Doing PT and walking without a cane now. No new concerns or problems.   Review of Systems  Constitutional: Positive for activity change. Negative for fever, appetite change and fatigue.  HENT: Negative.   Eyes: Negative.   Respiratory: Negative for cough, chest tightness, shortness of breath and wheezing.   Cardiovascular: Negative for chest pain, palpitations and leg swelling.  Gastrointestinal: Negative for abdominal pain, diarrhea, constipation, blood in stool and abdominal distention.  Musculoskeletal: Positive for arthralgias. Negative for myalgias, back pain and gait problem.  Skin: Negative.   Neurological: Negative.   Psychiatric/Behavioral: Negative.       Objective:   Physical Exam  Constitutional: She is oriented to person, place, and time. She appears well-developed and well-nourished.  HENT:  Head: Normocephalic and atraumatic.  Eyes: EOM are normal.  Neck: Normal range of motion.  Cardiovascular: Normal rate and regular rhythm.   Pulmonary/Chest: Effort normal and breath sounds normal. No respiratory distress. She has no wheezes. She has no rales.  Abdominal: Soft. Bowel sounds are normal. She exhibits no distension. There is no tenderness. There is no rebound.  Musculoskeletal: She exhibits no edema.  Neurological: She is alert and oriented to person, place, and time. Coordination normal.  Skin: Skin is warm and dry.  Well healing right knee replacement scar  Psychiatric: She has a normal mood and affect.   Filed Vitals:   03/20/15 0804  BP: 136/88  Pulse: 69  Temp: 98.5 F (36.9 C)  TempSrc:  Oral  Resp: 16  Height: 5\' 6"  (1.676 m)  Weight: 173 lb (78.472 kg)  SpO2: 98%      Assessment & Plan:

## 2015-03-20 NOTE — Assessment & Plan Note (Signed)
Doing well s/p replacement of the knee. Still having some mild pain and using oxycodone at night time. Not using any assistive device.

## 2015-06-15 ENCOUNTER — Other Ambulatory Visit: Payer: Self-pay | Admitting: Family

## 2015-06-16 ENCOUNTER — Other Ambulatory Visit: Payer: Self-pay | Admitting: Family

## 2015-06-16 ENCOUNTER — Other Ambulatory Visit: Payer: Self-pay | Admitting: Internal Medicine

## 2015-06-20 ENCOUNTER — Other Ambulatory Visit: Payer: Self-pay | Admitting: Family

## 2015-06-20 NOTE — Telephone Encounter (Signed)
[facility]

## 2015-06-20 NOTE — Telephone Encounter (Signed)
Faxed script back to

## 2015-09-11 ENCOUNTER — Other Ambulatory Visit: Payer: Self-pay | Admitting: Internal Medicine

## 2015-10-08 ENCOUNTER — Other Ambulatory Visit: Payer: Self-pay | Admitting: Internal Medicine

## 2015-11-09 ENCOUNTER — Other Ambulatory Visit: Payer: Self-pay | Admitting: Internal Medicine

## 2015-11-12 ENCOUNTER — Other Ambulatory Visit: Payer: Self-pay | Admitting: Internal Medicine

## 2015-12-11 ENCOUNTER — Other Ambulatory Visit: Payer: Self-pay | Admitting: Internal Medicine

## 2015-12-30 ENCOUNTER — Encounter: Payer: Self-pay | Admitting: Internal Medicine

## 2016-01-14 ENCOUNTER — Other Ambulatory Visit: Payer: Self-pay | Admitting: Internal Medicine

## 2016-01-20 ENCOUNTER — Other Ambulatory Visit: Payer: Self-pay | Admitting: Internal Medicine

## 2016-02-12 ENCOUNTER — Encounter: Payer: Self-pay | Admitting: Obstetrics & Gynecology

## 2016-02-17 ENCOUNTER — Telehealth: Payer: Self-pay | Admitting: Obstetrics & Gynecology

## 2016-02-17 NOTE — Telephone Encounter (Signed)
Patient would like to speak to a nurse about her lab result.

## 2016-02-17 NOTE — Telephone Encounter (Signed)
Left message to call Susan Esparza at [phone].  

## 2016-02-17 NOTE — Telephone Encounter (Signed)
Spoke with patient. Patient calling to ask when last AEX and date of last lab work. Advised patient last AEX and labs 01/28/15. Patient ask what last Hgb was at that appt -advised Hgb 13.3. Patient thankful and verbalizes understanding.  Routing to provider for final review. Patient is agreeable to disposition. Will close encounter.   Cc: Dr. Sabra Heck

## 2016-02-18 ENCOUNTER — Other Ambulatory Visit: Payer: Self-pay | Admitting: Internal Medicine

## 2016-03-11 ENCOUNTER — Other Ambulatory Visit: Payer: Self-pay | Admitting: Internal Medicine

## 2016-03-19 ENCOUNTER — Ambulatory Visit (INDEPENDENT_AMBULATORY_CARE_PROVIDER_SITE_OTHER): Payer: No Typology Code available for payment source | Admitting: Internal Medicine

## 2016-03-19 ENCOUNTER — Encounter: Payer: Self-pay | Admitting: Internal Medicine

## 2016-03-19 VITALS — BP 132/74 | HR 57 | Temp 98.3°F | Resp 18 | Ht 65.0 in | Wt 200.0 lb

## 2016-03-19 DIAGNOSIS — F988 Other specified behavioral and emotional disorders with onset usually occurring in childhood and adolescence: Secondary | ICD-10-CM | POA: Diagnosis not present

## 2016-03-19 DIAGNOSIS — Z0000 Encounter for general adult medical examination without abnormal findings: Secondary | ICD-10-CM

## 2016-03-19 NOTE — Progress Notes (Signed)
Pre visit review using our clinic review tool, if applicable. No additional management support is needed unless otherwise documented below in the visit note. 

## 2016-03-19 NOTE — Progress Notes (Signed)
   Subjective:    Patient ID: Susan Esparza, female    DOB: 08-Sep-1955, 60 y.o.   MRN: [id number]  HPI The patient is a 60 YO female coming in for wellness. No new concerns. Knee replacement this year (right) and well healed.   PMH, [facility], social history reviewed and updated.   Review of Systems  Constitutional: Negative.   HENT: Negative.   Eyes: Negative.   Respiratory: Negative for cough, chest tightness and shortness of breath.   Cardiovascular: Negative for chest pain, palpitations and leg swelling.  Gastrointestinal: Negative for abdominal distention, abdominal pain, constipation, diarrhea, nausea and vomiting.  Musculoskeletal: Negative.   Skin: Negative.   Neurological: Negative.   Psychiatric/Behavioral: Negative.       Objective:   Physical Exam  Constitutional: She is oriented to person, place, and time. She appears well-developed and well-nourished.  HENT:  Head: Normocephalic and atraumatic.  Eyes: EOM are normal.  Neck: Normal range of motion.  Cardiovascular: Normal rate and regular rhythm.   Pulmonary/Chest: Effort normal and breath sounds normal. No respiratory distress. She has no wheezes. She has no rales.  Abdominal: Soft. Bowel sounds are normal. She exhibits no distension. There is no tenderness. There is no rebound.  Musculoskeletal: She exhibits no edema.  Neurological: She is alert and oriented to person, place, and time. Coordination normal.  Skin: Skin is warm and dry.  Psychiatric: She has a normal mood and affect.   Vitals:   03/19/16 1520  BP: 132/74  Pulse: (!) 57  Resp: 18  Temp: 98.3 F (36.8 C)  TempSrc: Oral  SpO2: 98%  Weight: 200 lb (90.7 kg)  Height: 5\' 5"  (1.651 m)      Assessment & Plan:

## 2016-03-19 NOTE — Assessment & Plan Note (Signed)
Colonoscopy and mammogram up to date. Pap smear up to date. Flu shot done, tetanus up to date. Counseled about sun safety and mole surveillance as well as dangers of distracted driving. Given screening recommendations.

## 2016-03-19 NOTE — Assessment & Plan Note (Signed)
Taking prozac and doing well. Refill as needed.

## 2016-03-19 NOTE — Patient Instructions (Signed)
Food Choices for Gastroesophageal Reflux Disease, Adult When you have gastroesophageal reflux disease (GERD), the foods you eat and your eating habits are very important. Choosing the right foods can help ease your discomfort. What guidelines do I need to follow?  Choose fruits, vegetables, whole grains, and low-fat dairy products.  Choose low-fat meat, fish, and poultry.  Limit fats such as oils, salad dressings, butter, nuts, and avocado.  Keep a food diary. This helps you identify foods that cause symptoms.  Avoid foods that cause symptoms. These may be different for everyone.  Eat small meals often instead of 3 large meals a day.  Eat your meals slowly, in a place where you are relaxed.  Limit fried foods.  Cook foods using methods other than frying.  Avoid drinking alcohol.  Avoid drinking large amounts of liquids with your meals.  Avoid bending over or lying down until 2-3 hours after eating. What foods are not recommended? These are some foods and drinks that may make your symptoms worse: Vegetables  Tomatoes. Tomato juice. Tomato and spaghetti sauce. Chili peppers. Onion and garlic. Horseradish. Fruits  Oranges, grapefruit, and lemon (fruit and juice). Meats  High-fat meats, fish, and poultry. This includes hot dogs, ribs, ham, sausage, salami, and bacon. Dairy  Whole milk and chocolate milk. Sour cream. Cream. Butter. Ice cream. Cream cheese. Drinks  Coffee and tea. Bubbly (carbonated) drinks or energy drinks. Condiments  Hot sauce. Barbecue sauce. Sweets/Desserts  Chocolate and cocoa. Donuts. Peppermint and spearmint. Fats and Oils  High-fat foods. This includes [place] fries and potato chips. Other  Vinegar. Strong spices. This includes black pepper, white pepper, red pepper, cayenne, curry powder, cloves, ginger, and chili powder. The items listed above may not be a complete list of foods and drinks to avoid. Contact your dietitian for more information.    This information is not intended to replace advice given to you by your health care provider. Make sure you discuss any questions you have with your health care provider. Document Released: 09/21/2011 Document Revised: 08/28/2015 Document Reviewed: 01/24/2013 Elsevier Interactive Patient Education  2017 [place] Maintenance, Female Introduction Adopting a healthy lifestyle and getting preventive care can go a long way to promote health and wellness. Talk with your health care provider about what schedule of regular examinations is right for you. This is a good chance for you to check in with your provider about disease prevention and staying healthy. In between checkups, there are plenty of things you can do on your own. Experts have done a lot of research about which lifestyle changes and preventive measures are most likely to keep you healthy. Ask your health care provider for more information. Weight and diet Eat a healthy diet  Be sure to include plenty of vegetables, fruits, low-fat dairy products, and lean protein.  Do not eat a lot of foods high in solid fats, added sugars, or salt.  Get regular exercise. This is one of the most important things you can do for your health.  Most adults should exercise for at least 150 minutes each week. The exercise should increase your heart rate and make you sweat (moderate-intensity exercise).  Most adults should also do strengthening exercises at least twice a week. This is in addition to the moderate-intensity exercise. Maintain a healthy weight  Body mass index (BMI) is a measurement that can be used to identify possible weight problems. It estimates body fat based on height and weight. Your health care provider can  help determine your BMI and help you achieve or maintain a healthy weight.  For females 36 years of age and older:  A BMI below 18.5 is considered underweight.  A BMI of 18.5 to 24.9 is normal.  A BMI of 25 to  29.9 is considered overweight.  A BMI of 30 and above is considered obese. Watch levels of cholesterol and blood lipids  You should start having your blood tested for lipids and cholesterol at 60 years of age, then have this test every 5 years.  You may need to have your cholesterol levels checked more often if:  Your lipid or cholesterol levels are high.  You are older than 60 years of age.  You are at high risk for heart disease. Cancer screening Lung Cancer  Lung cancer screening is recommended for adults 21-64 years old who are at high risk for lung cancer because of a history of smoking.  A yearly low-dose CT scan of the lungs is recommended for people who:  Currently smoke.  Have quit within the past 15 years.  Have at least a 30-pack-year history of smoking. A pack year is smoking an average of one pack of cigarettes a day for 1 year.  Yearly screening should continue until it has been 15 years since you quit.  Yearly screening should stop if you develop a health problem that would prevent you from having lung cancer treatment. Breast Cancer  Practice breast self-awareness. This means understanding how your breasts normally appear and feel.  It also means doing regular breast self-exams. Let your health care provider know about any changes, no matter how small.  If you are in your 20s or 30s, you should have a clinical breast exam (CBE) by a health care provider every 1-3 years as part of a regular health exam.  If you are 82 or older, have a CBE every year. Also consider having a breast X-ray (mammogram) every year.  If you have a family history of breast cancer, talk to your health care provider about genetic screening.  If you are at high risk for breast cancer, talk to your health care provider about having an MRI and a mammogram every year.  Breast cancer gene (BRCA) assessment is recommended for women who have family members with BRCA-related cancers.  BRCA-related cancers include:  Breast.  Ovarian.  Tubal.  Peritoneal cancers.  Results of the assessment will determine the need for genetic counseling and BRCA1 and BRCA2 testing. Cervical Cancer  Your health care provider may recommend that you be screened regularly for cancer of the pelvic organs (ovaries, uterus, and vagina). This screening involves a pelvic examination, including checking for microscopic changes to the surface of your cervix (Pap test). You may be encouraged to have this screening done every 3 years, beginning at age 54.  For women ages 26-65, health care providers may recommend pelvic exams and Pap testing every 3 years, or they may recommend the Pap and pelvic exam, combined with testing for human papilloma virus (HPV), every 5 years. Some types of HPV increase your risk of cervical cancer. Testing for HPV may also be done on women of any age with unclear Pap test results.  Other health care providers may not recommend any screening for nonpregnant women who are considered low risk for pelvic cancer and who do not have symptoms. Ask your health care provider if a screening pelvic exam is right for you.  If you have had past treatment for cervical cancer  or a condition that could lead to cancer, you need Pap tests and screening for cancer for at least 20 years after your treatment. If Pap tests have been discontinued, your risk factors (such as having a new sexual partner) need to be reassessed to determine if screening should resume. Some women have medical problems that increase the chance of getting cervical cancer. In these cases, your health care provider may recommend more frequent screening and Pap tests. Colorectal Cancer  This type of cancer can be detected and often prevented.  Routine colorectal cancer screening usually begins at 60 years of age and continues through 60 years of age.  Your health care provider may recommend screening at an earlier age if you  have risk factors for colon cancer.  Your health care provider may also recommend using home test kits to check for hidden blood in the stool.  A small camera at the end of a tube can be used to examine your colon directly (sigmoidoscopy or colonoscopy). This is done to check for the earliest forms of colorectal cancer.  Routine screening usually begins at age 66.  Direct examination of the colon should be repeated every 5-10 years through 60 years of age. However, you may need to be screened more often if early forms of precancerous polyps or small growths are found. Skin Cancer  Check your skin from head to toe regularly.  Tell your health care provider about any new moles or changes in moles, especially if there is a change in a mole's shape or color.  Also tell your health care provider if you have a mole that is larger than the size of a pencil eraser.  Always use sunscreen. Apply sunscreen liberally and repeatedly throughout the day.  Protect yourself by wearing long sleeves, pants, a wide-brimmed hat, and sunglasses whenever you are outside. Heart disease, diabetes, and high blood pressure  High blood pressure causes heart disease and increases the risk of stroke. High blood pressure is more likely to develop in:  People who have blood pressure in the high end of the normal range (130-139/85-89 mm Hg).  People who are overweight or obese.  People who are African American.  If you are 9-77 years of age, have your blood pressure checked every 3-5 years. If you are 83 years of age or older, have your blood pressure checked every year. You should have your blood pressure measured twice-once when you are at a hospital or clinic, and once when you are not at a hospital or clinic. Record the average of the two measurements. To check your blood pressure when you are not at a hospital or clinic, you can use:  An automated blood pressure machine at a pharmacy.  A home blood pressure  monitor.  If you are between 51 years and 29 years old, ask your health care provider if you should take aspirin to prevent strokes.  Have regular diabetes screenings. This involves taking a blood sample to check your fasting blood sugar level.  If you are at a normal weight and have a low risk for diabetes, have this test once every three years after 60 years of age.  If you are overweight and have a high risk for diabetes, consider being tested at a younger age or more often. Preventing infection Hepatitis B  If you have a higher risk for hepatitis B, you should be screened for this virus. You are considered at high risk for hepatitis B if:  You were  born in a country where hepatitis B is common. Ask your health care provider which countries are considered high risk.  Your parents were born in a high-risk country, and you have not been immunized against hepatitis B (hepatitis B vaccine).  You have HIV or AIDS.  You use needles to inject street drugs.  You live with someone who has hepatitis B.  You have had sex with someone who has hepatitis B.  You get hemodialysis treatment.  You take certain medicines for conditions, including cancer, organ transplantation, and autoimmune conditions. Hepatitis C  Blood testing is recommended for:  Everyone born from [birth year] through 1965.  Anyone with known risk factors for hepatitis C. Sexually transmitted infections (STIs)  You should be screened for sexually transmitted infections (STIs) including gonorrhea and chlamydia if:  You are sexually active and are younger than 60 years of age.  You are older than 60 years of age and your health care provider tells you that you are at risk for this type of infection.  Your sexual activity has changed since you were last screened and you are at an increased risk for chlamydia or gonorrhea. Ask your health care provider if you are at risk.  If you do not have HIV, but are at risk, it may be  recommended that you take a prescription medicine daily to prevent HIV infection. This is called pre-exposure prophylaxis (PrEP). You are considered at risk if:  You are sexually active and do not regularly use condoms or know the HIV status of your partner(s).  You take drugs by injection.  You are sexually active with a partner who has HIV. Talk with your health care provider about whether you are at high risk of being infected with HIV. If you choose to begin PrEP, you should first be tested for HIV. You should then be tested every 3 months for as long as you are taking PrEP. Pregnancy  If you are premenopausal and you may become pregnant, ask your health care provider about preconception counseling.  If you may become pregnant, take 400 to 800 micrograms (mcg) of folic acid every day.  If you want to prevent pregnancy, talk to your health care provider about birth control (contraception). Osteoporosis and menopause  Osteoporosis is a disease in which the bones lose minerals and strength with aging. This can result in serious bone fractures. Your risk for osteoporosis can be identified using a bone density scan.  If you are 80 years of age or older, or if you are at risk for osteoporosis and fractures, ask your health care provider if you should be screened.  Ask your health care provider whether you should take a calcium or vitamin D supplement to lower your risk for osteoporosis.  Menopause may have certain physical symptoms and risks.  Hormone replacement therapy may reduce some of these symptoms and risks. Talk to your health care provider about whether hormone replacement therapy is right for you. Follow these instructions at home:  Schedule regular health, dental, and eye exams.  Stay current with your immunizations.  Do not use any tobacco products including cigarettes, chewing tobacco, or electronic cigarettes.  If you are pregnant, do not drink alcohol.  If you are  breastfeeding, limit how much and how often you drink alcohol.  Limit alcohol intake to no more than 1 drink per day for nonpregnant women. One drink equals 12 ounces of beer, 5 ounces of wine, or 1 ounces of hard liquor.  Do not  use street drugs.  Do not share needles.  Ask your health care provider for help if you need support or information about quitting drugs.  Tell your health care provider if you often feel depressed.  Tell your health care provider if you have ever been abused or do not feel safe at home. This information is not intended to replace advice given to you by your health care provider. Make sure you discuss any questions you have with your health care provider. Document Released: 10/05/2010 Document Revised: 08/28/2015 Document Reviewed: 12/24/2014  2017 Elsevier

## 2016-04-05 DIAGNOSIS — M069 Rheumatoid arthritis, unspecified: Secondary | ICD-10-CM

## 2016-04-05 HISTORY — DX: Rheumatoid arthritis, unspecified: M06.9

## 2016-04-26 ENCOUNTER — Other Ambulatory Visit: Payer: Self-pay | Admitting: Internal Medicine

## 2016-05-18 ENCOUNTER — Encounter: Payer: Self-pay | Admitting: Obstetrics & Gynecology

## 2016-05-18 ENCOUNTER — Ambulatory Visit (INDEPENDENT_AMBULATORY_CARE_PROVIDER_SITE_OTHER): Payer: PRIVATE HEALTH INSURANCE | Admitting: Obstetrics & Gynecology

## 2016-05-18 VITALS — BP 120/86 | HR 72 | Resp 16 | Ht 64.25 in | Wt 201.0 lb

## 2016-05-18 DIAGNOSIS — Z0000 Encounter for general adult medical examination without abnormal findings: Secondary | ICD-10-CM | POA: Diagnosis not present

## 2016-05-18 DIAGNOSIS — Z124 Encounter for screening for malignant neoplasm of cervix: Secondary | ICD-10-CM | POA: Diagnosis not present

## 2016-05-18 DIAGNOSIS — Z01419 Encounter for gynecological examination (general) (routine) without abnormal findings: Secondary | ICD-10-CM

## 2016-05-18 LAB — CBC
HCT: 41.6 % (ref 35.0–45.0)
Hemoglobin: 13.8 g/dL (ref 11.7–15.5)
MCH: 30.4 pg (ref 27.0–33.0)
MCHC: 33.2 g/dL (ref 32.0–36.0)
MCV: 91.6 fL (ref 80.0–100.0)
MPV: 10.8 fL (ref 7.5–12.5)
PLATELETS: 250 10*3/uL (ref 140–400)
RBC: 4.54 MIL/uL (ref 3.80–5.10)
RDW: 13.6 % (ref 11.0–15.0)
WBC: 4.6 10*3/uL (ref 3.8–10.8)

## 2016-05-18 LAB — LIPID PANEL
CHOLESTEROL: 223 mg/dL — AB (ref <200)
HDL: 61 mg/dL (ref >50)
LDL Cholesterol: 125 mg/dL — ABNORMAL HIGH (ref <100)
Total CHOL/HDL Ratio: 3.7 Ratio (ref <5.0)
Triglycerides: 186 mg/dL — ABNORMAL HIGH (ref <150)
VLDL: 37 mg/dL — ABNORMAL HIGH (ref <30)

## 2016-05-18 LAB — COMPREHENSIVE METABOLIC PANEL
ALK PHOS: 78 U/L (ref 33–130)
ALT: 26 U/L (ref 6–29)
AST: 21 U/L (ref 10–35)
Albumin: 4.6 g/dL (ref 3.6–5.1)
BILIRUBIN TOTAL: 0.7 mg/dL (ref 0.2–1.2)
BUN: 14 mg/dL (ref 7–25)
CO2: 29 mmol/L (ref 20–31)
CREATININE: 0.77 mg/dL (ref 0.50–0.99)
Calcium: 9.6 mg/dL (ref 8.6–10.4)
Chloride: 102 mmol/L (ref 98–110)
Glucose, Bld: 86 mg/dL (ref 65–99)
POTASSIUM: 4.1 mmol/L (ref 3.5–5.3)
Sodium: 140 mmol/L (ref 135–146)
TOTAL PROTEIN: 7.4 g/dL (ref 6.1–8.1)

## 2016-05-18 LAB — POCT URINALYSIS DIPSTICK
BILIRUBIN UA: NEGATIVE
Blood, UA: NEGATIVE
GLUCOSE UA: NEGATIVE
Ketones, UA: NEGATIVE
Nitrite, UA: NEGATIVE
PH UA: 5
Urobilinogen, UA: NEGATIVE

## 2016-05-18 LAB — TSH: TSH: 2.8 mIU/L

## 2016-05-18 NOTE — Progress Notes (Signed)
61 y.o. [id number] Married Caucasian F here for annual exam.  Doing well.  Had right knee replacement with Dr. Maureen Ralphs about a year ago.  Denies vaginal bleeding.    Patient's last menstrual period was 01/04/2008.          Sexually active: Yes.    The current method of family planning is vasectomy.    Exercising: Yes.    walking, yoga Smoker:  no  Health Maintenance: Pap:  12/03/13 negative  History of abnormal Pap:  no MMG:  01/26/16 BIRADS 1 negative  Colonoscopy: 12/17 with Dr. Collene Mares- normal per patient- repeat in 5 years BMD:   Heels test in past TDaP:  09/08/14 Pneumonia vaccine(s):  never Zostavax:   01/18/15  Hep C testing: 01/28/15 negative  Screening Labs: drawn today, Hb today: same, Urine today: trace WBC's, trace protein    reports that she has never smoked. She has never used smokeless tobacco. She reports that she drinks about 2.5 oz of alcohol per week . She reports that she does not use drugs.  Past Medical History:  Diagnosis Date  . Allergic rhinitis   . Anxiety   . Arthritis   . Chronic dryness of both eyes   . Depression   . DVT (deep vein thrombosis) in pregnancy [facility]) 2009   left leg after knee Arthroscopy  . Dysrhythmia    HX of "Bigemini"  . Numbness    rt hand  . Sleep apnea    "borderline"  uses c-pap    Past Surgical History:  Procedure Laterality Date  . ENDOMETRIAL ABLATION  2006  . FACIAL COSMETIC SURGERY  2006  . KNEE ARTHROSCOPY Right 1989  . KNEE ARTHROSCOPY Left 2009  . NASAL POLYP SURGERY  01/2008      . TOTAL KNEE ARTHROPLASTY Right 02/24/2015   Procedure: TOTAL RIGHT KNEE ARTHROPLASTY;  Surgeon: Gaynelle Arabian, MD;  Location: [facility];  Service: Orthopedics;  Laterality: Right;    Current Outpatient Prescriptions  Medication Sig Dispense Refill  . diclofenac (CATAFLAM) 50 MG tablet TAKE ONE TABLET BY MOUTH TWICE DAILY 60 tablet 0  . FLUoxetine (PROZAC) 20 MG tablet TAKE ONE TABLET BY MOUTH ONE TIME DAILY 30 tablet 0  . LORazepam  (ATIVAN) 0.5 MG tablet TAKE ONE TABLET BY MOUTH DAILY AS NEEDED 30 tablet 3  . psyllium (HYDROCIL/METAMUCIL) 95 % PACK Take 1 packet by mouth daily. Reported on 03/20/2015    . XIIDRA 5 % SOLN Place 1 drop into both eyes 2 (two) times daily. Reported on 03/20/2015  4   No current facility-administered medications for this visit.     Family History  Problem Relation Age of Onset  . Hypertension Mother   . Osteoarthritis Mother   . Alzheimer's disease Mother   . Cancer Father     colon cancer  . Heart disease Father     tacycardia  . Diabetes Maternal Grandfather     ROS:  Pertinent items are noted in HPI.  Otherwise, a comprehensive ROS was negative.  Exam:   BP 120/86 (BP Location: Right Arm, Patient Position: Sitting, Cuff Size: Normal)   Pulse 72   Resp 16   Ht 5' 4.25" (1.632 m)   Wt 201 lb (91.2 kg)   LMP 01/04/2008   BMI 34.23 kg/m   Weight change: @WEIGHTCHANGE @ Height:   Height: 5' 4.25" (163.2 cm)  Ht Readings from Last 3 Encounters:  05/18/16 5' 4.25" (1.632 m)  03/19/16 5\' 5"  (1.651 m)  03/20/15 5'  6" (1.676 m)    General appearance: alert, cooperative and appears stated age Head: Normocephalic, without obvious abnormality, atraumatic Neck: no adenopathy, supple, symmetrical, trachea midline and thyroid normal to inspection and palpation Lungs: clear to auscultation bilaterally Breasts: normal appearance, no masses or tenderness Heart: regular rate and rhythm Abdomen: soft, non-tender; bowel sounds normal; no masses,  no organomegaly Extremities: extremities normal, atraumatic, no cyanosis or edema Skin: Skin color, texture, turgor normal. No rashes or lesions Lymph nodes: Cervical, supraclavicular, and axillary nodes normal. No abnormal inguinal nodes palpated Neurologic: Grossly normal   Pelvic: External genitalia:  no lesions              Urethra:  normal appearing urethra with no masses, tenderness or lesions              Bartholins and Skenes: normal                  Vagina: normal appearing vagina with normal color and discharge, no lesions              Cervix: no lesions              Pap taken: Yes.   Bimanual Exam:  Uterus:  normal size, contour, position, consistency, mobility, non-tender              Adnexa: normal adnexa and no mass, fullness, tenderness               Rectovaginal: Confirms               Anus:  normal sphincter tone, no lesions  Chaperone was present for exam.  A:  Well Woman with normal exam PMP, no HRT H/O colonic polyps but last colonoscopy was clear Vit D def H/O mildly elevated AST/ALT in th epast  P:   Mammogram yearly.  Doing 3D. pap smear and HR HPV obtained today CMP, CBC, Lipids, TSH, Vit D Return annually or prn

## 2016-05-19 LAB — VITAMIN D 25 HYDROXY (VIT D DEFICIENCY, FRACTURES): VIT D 25 HYDROXY: 19 ng/mL — AB (ref 30–100)

## 2016-05-20 ENCOUNTER — Telehealth: Payer: Self-pay | Admitting: *Deleted

## 2016-05-20 DIAGNOSIS — E559 Vitamin D deficiency, unspecified: Secondary | ICD-10-CM

## 2016-05-20 MED ORDER — VITAMIN D (ERGOCALCIFEROL) 1.25 MG (50000 UNIT) PO CAPS: 50000.0000 [IU] | ORAL_CAPSULE | ORAL | 0 refills | Status: DC

## 2016-05-20 NOTE — Telephone Encounter (Addendum)
Returned call to patient. Results given to patient as seen below from Dr. Sabra Heck. Verbalized understanding. Prescription sent in for ergocalciferol to patient's preferred pharmacy. Instructions on use given to patient and she verbalized understanding. 3 month lab recheck scheduled for Monday 08/23/16 at 0945. Patient agreeable to date and time of appointment. Verbal release of records filled out to release labs to Dr. Sharlet Salina and taken to [facility] to be processed. Patient also requesting to come by and pick up a copy of lab work. Verbal release filled out and taken to [facility] to process as well.   Routing to provider for final review. Patient agreeable to disposition. Will close encounter.

## 2016-05-20 NOTE — Telephone Encounter (Signed)
Message left to return call to Susan Esparza at [phone].    

## 2016-05-20 NOTE — Telephone Encounter (Signed)
-----   Message from Megan Salon, MD sent at 05/20/2016 10:30 AM EST ----- Please let Susan Esparza know her Vit D is low at 71. Needs Vit D 50K weekly for 12 weeks. Then she should repeat level. Lab order placed. Rx order has NOT been placed.    CBC, CMP, TSH were normal.    Cholesterol was elevated at 223 and LDLs 125.  Triglycerides were elevated at 186.  She wants me to send results to Dr. Sharlet Salina.  Please make sure correct release is done and then let me know so I can send records. Thanks.

## 2016-05-20 NOTE — Telephone Encounter (Signed)
Patient is returning a call to Emily. °

## 2016-05-21 LAB — IPS PAP TEST WITH HPV

## 2016-05-23 ENCOUNTER — Telehealth: Payer: Self-pay | Admitting: Internal Medicine

## 2016-05-25 MED ORDER — FLUOXETINE HCL 20 MG PO CAPS
20.0000 mg | ORAL_CAPSULE | Freq: Every day | ORAL | 0 refills | Status: DC
Start: 2016-05-25 — End: 2016-06-22

## 2016-05-25 NOTE — Telephone Encounter (Signed)
Rec'd fax stating pt is requesting the alternative fluoxetine capsule v/s tablets due to cost. Is this ok to change...Susan Esparza

## 2016-05-25 NOTE — Telephone Encounter (Signed)
Okay to change? 

## 2016-05-25 NOTE — Telephone Encounter (Signed)
Updated med list sent capsules...Johny Chess

## 2016-06-22 ENCOUNTER — Other Ambulatory Visit: Payer: Self-pay | Admitting: Internal Medicine

## 2016-07-23 ENCOUNTER — Other Ambulatory Visit: Payer: Self-pay | Admitting: Internal Medicine

## 2016-08-13 ENCOUNTER — Other Ambulatory Visit: Payer: Self-pay | Admitting: Obstetrics & Gynecology

## 2016-08-13 DIAGNOSIS — E559 Vitamin D deficiency, unspecified: Secondary | ICD-10-CM

## 2016-08-23 ENCOUNTER — Other Ambulatory Visit: Payer: Self-pay

## 2016-08-26 ENCOUNTER — Other Ambulatory Visit (INDEPENDENT_AMBULATORY_CARE_PROVIDER_SITE_OTHER): Payer: PRIVATE HEALTH INSURANCE

## 2016-08-26 ENCOUNTER — Other Ambulatory Visit: Payer: Self-pay | Admitting: Obstetrics & Gynecology

## 2016-08-26 DIAGNOSIS — E785 Hyperlipidemia, unspecified: Secondary | ICD-10-CM

## 2016-08-26 DIAGNOSIS — E559 Vitamin D deficiency, unspecified: Secondary | ICD-10-CM

## 2016-08-26 LAB — LIPID PANEL
Cholesterol: 214 mg/dL — ABNORMAL HIGH (ref <200)
HDL: 57 mg/dL (ref >50)
LDL Cholesterol: 126 mg/dL — ABNORMAL HIGH (ref <100)
Total CHOL/HDL Ratio: 3.8 Ratio (ref <5.0)
Triglycerides: 153 mg/dL — ABNORMAL HIGH (ref <150)
VLDL: 31 mg/dL — ABNORMAL HIGH (ref <30)

## 2016-08-27 ENCOUNTER — Other Ambulatory Visit: Payer: Self-pay | Admitting: Obstetrics & Gynecology

## 2016-08-27 DIAGNOSIS — E559 Vitamin D deficiency, unspecified: Secondary | ICD-10-CM

## 2016-08-27 LAB — VITAMIN D 25 HYDROXY (VIT D DEFICIENCY, FRACTURES): Vit D, 25-Hydroxy: 27 ng/mL — ABNORMAL LOW (ref 30–100)

## 2016-08-27 MED ORDER — VITAMIN D (ERGOCALCIFEROL) 1.25 MG (50000 UNIT) PO CAPS: 50000.0000 [IU] | ORAL_CAPSULE | ORAL | 3 refills | Status: DC

## 2016-09-22 ENCOUNTER — Encounter: Payer: Self-pay | Admitting: Family Medicine

## 2016-09-22 ENCOUNTER — Ambulatory Visit: Payer: No Typology Code available for payment source | Admitting: Family Medicine

## 2016-09-22 ENCOUNTER — Ambulatory Visit (INDEPENDENT_AMBULATORY_CARE_PROVIDER_SITE_OTHER): Payer: No Typology Code available for payment source | Admitting: Family Medicine

## 2016-09-22 ENCOUNTER — Other Ambulatory Visit: Payer: Self-pay | Admitting: Internal Medicine

## 2016-09-22 VITALS — BP 110/70 | HR 58 | Temp 98.5°F | Wt 199.0 lb

## 2016-09-22 DIAGNOSIS — M19071 Primary osteoarthritis, right ankle and foot: Secondary | ICD-10-CM

## 2016-09-22 DIAGNOSIS — Z6833 Body mass index (BMI) 33.0-33.9, adult: Secondary | ICD-10-CM

## 2016-09-22 DIAGNOSIS — M19041 Primary osteoarthritis, right hand: Secondary | ICD-10-CM

## 2016-09-22 DIAGNOSIS — M19042 Primary osteoarthritis, left hand: Secondary | ICD-10-CM

## 2016-09-22 DIAGNOSIS — M19072 Primary osteoarthritis, left ankle and foot: Secondary | ICD-10-CM

## 2016-09-22 MED ORDER — MELOXICAM 7.5 MG PO TABS: 7.5000 mg | ORAL_TABLET | Freq: Every day | ORAL | 0 refills | Status: DC

## 2016-09-22 NOTE — Telephone Encounter (Signed)
Routing to greg, please advise on pain med in dr crawfords absence, thanks

## 2016-09-22 NOTE — Progress Notes (Signed)
Subjective:    Patient ID: Susan Esparza, female    DOB: 01-17-1956, 61 y.o.   MRN: [id number]  HPI This is a 61 yo female who presents today with concerns for her medication. Concerned about long term risk of using diclofenac. She has been on diclofenac for a long time, has pain in hands, feet and knees. Has negative rheumatoid factor. Is a retired Pharmacist, hospital, has had more trouble holding writing instruments. Has had more stomach indigestion. Also takes occasional ibuprofen 200-400 mg. Some improvement with dietary changes- decreased fat and alcohol have improved indigestion.   Had flare with low back over last couple of weeks and had some hip pain which is improving. Is doing a yoga video most days which really helps.   Frustrated with weight gain. Lost 40 pounds prior to knee replacement and has gained back weight over 1.5 years. Has seen dietician a couple of times which she finds helpful. Has history of disordered eating and feels that she is not able to "listen to my body," with regards to feeling full.     Past Medical History:  Diagnosis Date  . Allergic rhinitis   . Anxiety   . Arthritis   . Chronic dryness of both eyes   . Depression   . DVT (deep vein thrombosis) in pregnancy [facility]) 2009   left leg after knee Arthroscopy  . Dysrhythmia    HX of "Bigemini"  . Numbness    rt hand  . Sleep apnea    "borderline"  uses c-pap   Past Surgical History:  Procedure Laterality Date  . ENDOMETRIAL ABLATION  2006  . FACIAL COSMETIC SURGERY  2006  . KNEE ARTHROSCOPY Right 1989  . KNEE ARTHROSCOPY Left 2009  . NASAL POLYP SURGERY  01/2008      . TOTAL KNEE ARTHROPLASTY Right 02/24/2015   Procedure: TOTAL RIGHT KNEE ARTHROPLASTY;  Surgeon: Gaynelle Arabian, MD;  Location: [facility];  Service: Orthopedics;  Laterality: Right;   Family History  Problem Relation Age of Onset  . Hypertension Mother   . Osteoarthritis Mother   . Alzheimer's disease Mother   . Cancer Father        colon  cancer  . Heart disease Father        tacycardia  . Diabetes Maternal Grandfather    Social History  Substance Use Topics  . Smoking status: Never Smoker  . Smokeless tobacco: Never Used  . Alcohol use 2.5 oz/week    5 Standard drinks or equivalent per week     Comment: occasional      Review of Systems Per HPI    Objective:   Physical Exam  Constitutional: She is oriented to person, place, and time. She appears well-developed and well-nourished. No distress.  Obese.   HENT:  Head: Normocephalic and atraumatic.  Eyes: Conjunctivae are normal.  Cardiovascular: Normal rate, regular rhythm and normal heart sounds.   Pulmonary/Chest: Effort normal and breath sounds normal.  Musculoskeletal:       Right hip: She exhibits normal range of motion, normal strength and no tenderness.       Lumbar back: Normal.       Right foot: Normal. There is normal range of motion, no tenderness, no bony tenderness and no swelling.       Left foot: Normal. There is normal range of motion, no tenderness, no bony tenderness, no swelling and normal capillary refill.  Prominent right MCP joint. No erythema or tenderness. No effusions.   Neurological:  She is alert and oriented to person, place, and time.  Skin: Skin is warm and dry. She is not diaphoretic.  Psychiatric: She has a normal mood and affect. Her behavior is normal. Judgment and thought content normal.  Vitals reviewed.     BP 110/70 (BP Location: Right Arm, Patient Position: Sitting, Cuff Size: Normal)   Pulse (!) 58   Temp 98.5 F (36.9 C) (Oral)   Wt 199 lb (90.3 kg)   LMP 01/04/2008   SpO2 98%   BMI 33.89 kg/m  Wt Readings from Last 3 Encounters:  09/22/16 199 lb (90.3 kg)  05/18/16 201 lb (91.2 kg)  03/19/16 200 lb (90.7 kg)       Assessment & Plan:  1. Osteoarthritis of both hands, unspecified osteoarthritis type - dicussed diagnosis and likelihood of osteoarthritis given negative RF and osteoarthritis in knees,  discussed importance of increased activity and other relief measures.  - will try changing diclofenac to meloxicam, nml creatinine clearance 2/18 - meloxicam (MOBIC) 7.5 MG tablet; Take 1-2 tablets (7.5-15 mg total) by mouth daily.  Dispense: 60 tablet; Refill: 0  2. Osteoarthritis of both feet, unspecified osteoarthritis type - see number 1 - meloxicam (MOBIC) 7.5 MG tablet; Take 1-2 tablets (7.5-15 mg total) by mouth daily.  Dispense: 60 tablet; Refill: 0  3. BMI 33.0-33.9,adult - encouraged her efforts to make healthy food choices and increase physical activity including yoga/stretching, HIIT, weight training   Clarene Reamer, FNP-BC  [facility] [place], [place]  09/22/2016 9:17 PM

## 2016-09-22 NOTE — Patient Instructions (Addendum)
[email or web address]  HIIT- high intensity interval   Weight training  Topical pain relievers for hands and feet, try ice/heat

## 2016-10-05 ENCOUNTER — Encounter: Payer: Self-pay | Admitting: Family Medicine

## 2016-11-09 ENCOUNTER — Other Ambulatory Visit: Payer: Self-pay | Admitting: Family Medicine

## 2016-11-09 DIAGNOSIS — M19042 Primary osteoarthritis, left hand: Secondary | ICD-10-CM

## 2016-11-09 DIAGNOSIS — M19072 Primary osteoarthritis, left ankle and foot: Secondary | ICD-10-CM

## 2016-11-09 DIAGNOSIS — M19041 Primary osteoarthritis, right hand: Secondary | ICD-10-CM

## 2016-11-09 DIAGNOSIS — M19071 Primary osteoarthritis, right ankle and foot: Secondary | ICD-10-CM

## 2016-11-22 ENCOUNTER — Other Ambulatory Visit: Payer: Self-pay | Admitting: Family

## 2016-11-24 NOTE — Telephone Encounter (Signed)
Patient calling in regard to refill.  Please follow up with patient at [phone].

## 2016-11-24 NOTE — Telephone Encounter (Signed)
Reviewed chart pt is up-to-date sent [facility] [place].../[place],b

## 2016-12-20 ENCOUNTER — Other Ambulatory Visit: Payer: Self-pay | Admitting: Family

## 2016-12-20 ENCOUNTER — Other Ambulatory Visit: Payer: Self-pay | Admitting: Internal Medicine

## 2016-12-20 DIAGNOSIS — M19041 Primary osteoarthritis, right hand: Secondary | ICD-10-CM

## 2016-12-20 DIAGNOSIS — M19071 Primary osteoarthritis, right ankle and foot: Secondary | ICD-10-CM

## 2016-12-20 DIAGNOSIS — M19072 Primary osteoarthritis, left ankle and foot: Secondary | ICD-10-CM

## 2016-12-20 DIAGNOSIS — M19042 Primary osteoarthritis, left hand: Secondary | ICD-10-CM

## 2017-01-01 ENCOUNTER — Other Ambulatory Visit: Payer: Self-pay | Admitting: Internal Medicine

## 2017-01-06 ENCOUNTER — Ambulatory Visit (INDEPENDENT_AMBULATORY_CARE_PROVIDER_SITE_OTHER): Payer: No Typology Code available for payment source | Admitting: Family Medicine

## 2017-01-06 ENCOUNTER — Encounter: Payer: Self-pay | Admitting: Family Medicine

## 2017-01-06 ENCOUNTER — Ambulatory Visit (INDEPENDENT_AMBULATORY_CARE_PROVIDER_SITE_OTHER)
Admission: RE | Admit: 2017-01-06 | Discharge: 2017-01-06 | Disposition: A | Discharge status: Home / Self Care | Payer: No Typology Code available for payment source | Source: Ambulatory Visit | Attending: Family Medicine | Admitting: Family Medicine

## 2017-01-06 VITALS — BP 124/84 | HR 49 | Temp 98.4°F | Ht 64.25 in | Wt 202.0 lb

## 2017-01-06 DIAGNOSIS — M79641 Pain in right hand: Secondary | ICD-10-CM

## 2017-01-06 DIAGNOSIS — S62316A Displaced fracture of base of fifth metacarpal bone, right hand, initial encounter for closed fracture: Secondary | ICD-10-CM

## 2017-01-06 DIAGNOSIS — Z23 Encounter for immunization: Secondary | ICD-10-CM | POA: Diagnosis not present

## 2017-01-06 DIAGNOSIS — S62316D Displaced fracture of base of fifth metacarpal bone, right hand, subsequent encounter for fracture with routine healing: Secondary | ICD-10-CM | POA: Insufficient documentation
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
## 2017-01-06 NOTE — Assessment & Plan Note (Signed)
I have independently reviewed the right hand x-rays which show a minimally displaced dorsal shaft fracture at the junction of the shaft and base of the fifth metacarpal - Placed in excess or gutter brace today. - She will take Motrin and Tylenol - She'll follow-up in 2 weeks to reimage this area

## 2017-01-06 NOTE — Patient Instructions (Signed)
Thank you for coming in,   We will call you with the results.   Please follow up with me if there is no improvement.    Please feel free to call with any questions or concerns at any time, at [phone]. --Dr. Raeford Razor

## 2017-01-20 ENCOUNTER — Ambulatory Visit: Payer: No Typology Code available for payment source | Admitting: Family Medicine

## 2017-01-27 ENCOUNTER — Ambulatory Visit (INDEPENDENT_AMBULATORY_CARE_PROVIDER_SITE_OTHER): Payer: No Typology Code available for payment source | Admitting: Family Medicine

## 2017-01-27 ENCOUNTER — Ambulatory Visit (INDEPENDENT_AMBULATORY_CARE_PROVIDER_SITE_OTHER)
Admission: RE | Admit: 2017-01-27 | Discharge: 2017-01-27 | Disposition: A | Discharge status: Home / Self Care | Payer: No Typology Code available for payment source | Source: Ambulatory Visit | Attending: Family Medicine | Admitting: Family Medicine

## 2017-01-27 ENCOUNTER — Encounter: Payer: Self-pay | Admitting: Family Medicine

## 2017-01-27 VITALS — BP 124/76 | HR 56 | Temp 98.7°F | Ht 64.25 in | Wt 202.0 lb

## 2017-01-27 DIAGNOSIS — S62316A Displaced fracture of base of fifth metacarpal bone, right hand, initial encounter for closed fracture: Secondary | ICD-10-CM

## 2017-01-27 DIAGNOSIS — Z23 Encounter for immunization: Secondary | ICD-10-CM | POA: Diagnosis not present

## 2017-01-27 NOTE — Progress Notes (Signed)
Susan Esparza - 61 y.o. female MRN [id number]  Date of birth: Mar 12, 1956  SUBJECTIVE:  Including CC & ROS.  Chief Complaint  Patient presents with  . Follow-up    metacarpal fracture-improved-She has been wearing brace daily,    Susan Esparza is a 61 y.o. female that is following up for a right fifth shaft metacarpal fracture.  Has been using the ulnar gutter splint reports improvement of her symptoms.  But does have pain intermittently.  The pain is mild in nature.  Is not had to take any oral medications for pain.  Was done well prescription improvement from previously.  The area is localized to her.  There is a bump in this area.  Independent review of the right hand x-ray from 10/4 shows a metacarpal fracture at the junction of the base and the shaft.  Independent review of the right hand x-ray from today shows a healing fracture.   Review of Systems  Musculoskeletal: Negative for gait problem and joint swelling.  Skin: Negative for color change.  Neurological: Negative for weakness and numbness.  Hematological: Negative for adenopathy.    HISTORY: Past Medical, Surgical, Social, and Family History Reviewed & Updated per EMR.   Pertinent Historical Findings include:  Past Medical History:  Diagnosis Date  . Allergic rhinitis   . Anxiety   . Arthritis   . Chronic dryness of both eyes   . Depression   . DVT (deep vein thrombosis) in pregnancy [facility]) 2009   left leg after knee Arthroscopy  . Dysrhythmia    HX of "Bigemini"  . Numbness    rt hand  . Sleep apnea    "borderline"  uses c-pap    Past Surgical History:  Procedure Laterality Date  . ENDOMETRIAL ABLATION  2006  . FACIAL COSMETIC SURGERY  2006  . KNEE ARTHROSCOPY Right 1989  . KNEE ARTHROSCOPY Left 2009  . NASAL POLYP SURGERY  01/2008      . TOTAL KNEE ARTHROPLASTY Right 02/24/2015   Procedure: TOTAL RIGHT KNEE ARTHROPLASTY;  Surgeon: Gaynelle Arabian, MD;  Location: [facility];  Service: Orthopedics;  Laterality:  Right;    Allergies  Allergen Reactions  . Fibrin Sealant Component Other (See Comments)    Did not work. Wound came apart.  . Penicillins Hives    Has patient had a PCN reaction causing immediate rash, facial/tongue/throat swelling, SOB or lightheadedness with hypotension: No Has patient had a PCN reaction causing severe rash involving mucus membranes or skin necrosis: No Has patient had a PCN reaction that required hospitalization No Has patient had a PCN reaction occurring within the last 10 years: No If all of the above answers are "NO", then may proceed with Cephalosporin use.   . Bupropion Hcl Itching and Rash    Family History  Problem Relation Age of Onset  . Hypertension Mother   . Osteoarthritis Mother   . Alzheimer's disease Mother   . Cancer Father        colon cancer  . Heart disease Father        tacycardia  . Diabetes Maternal Grandfather      Social History   Social History  . Marital status: Married    Spouse name: N/A  . Number of children: N/A  . Years of education: N/A   Occupational History  . Not on file.   Social History Main Topics  . Smoking status: Never Smoker  . Smokeless tobacco: Never Used  . Alcohol use 2.5 oz/week  5 Standard drinks or equivalent per week     Comment: occasional  . Drug use: No  . Sexual activity: Yes    Partners: Male    Birth control/ protection: Other-see comments     Comment: vasectomy   Other Topics Concern  . Not on file   Social History Narrative   ** Merged History Encounter **         PHYSICAL EXAM:  VS: BP 124/76 (BP Location: Left Arm, Patient Position: Sitting, Cuff Size: Normal)   Pulse (!) 56   Temp 98.7 F (37.1 C) (Oral)   Ht 5' 4.25" (1.632 m)   Wt 202 lb (91.6 kg)   LMP 01/04/2008   SpO2 98%   BMI 34.40 kg/m  Physical Exam Gen: NAD, alert, cooperative with exam, well-appearing ENT: normal lips, normal nasal mucosa,  Eye: normal EOM, normal conjunctiva and lids CV:  no  edema, +2 pedal pulses   Resp: no accessory muscle use, non-labored,  Skin: no rashes, no areas of induration  Neuro: normal tone, normal sensation to touch Psych:  normal insight, alert and oriented MSK:  Right hand: No abnormal ecchymosis. No significant malrotation or misaligned. No significant area of tenderness other than the base of the fifth metacarpal. Normal wrist range of motion. Normal grip strength. Neurovascular intact     ASSESSMENT & PLAN:   Displaced fracture of base of fifth metacarpal bone, right hand, initial encounter for closed fracture Healing has been shown on imaging.  She has been in the ulnar gutter splint for roughly 2 weeks.  Her injury was roughly 5 weeks ago. -We will continue the ulnar gutter splint for 2 more weeks.  Then she can wean out of this. -Advised to continue the ulnar gutter splint for the next 4-6 weeks with any significant activity. -She will follow-up in 4 weeks can reimage at that time.

## 2017-01-27 NOTE — Progress Notes (Signed)
[name] [address] [place], [place] [address] Phone: [phone] Subjective:    I'm seeing this patient by the request  of:    CC:   [id number]  Susan Esparza is a 61 y.o. female coming in with complaint of   Onset-  Location Duration-  Character- Aggravating factors- Reliving factors-  Therapies tried-  Severity-     Past Medical History:  Diagnosis Date  . Allergic rhinitis   . Anxiety   . Arthritis   . Chronic dryness of both eyes   . Depression   . DVT (deep vein thrombosis) in pregnancy [facility]) 2009   left leg after knee Arthroscopy  . Dysrhythmia    HX of "Bigemini"  . Numbness    rt hand  . Sleep apnea    "borderline"  uses c-pap   Past Surgical History:  Procedure Laterality Date  . ENDOMETRIAL ABLATION  2006  . FACIAL COSMETIC SURGERY  2006  . KNEE ARTHROSCOPY Right 1989  . KNEE ARTHROSCOPY Left 2009  . NASAL POLYP SURGERY  01/2008      . TOTAL KNEE ARTHROPLASTY Right 02/24/2015   Procedure: TOTAL RIGHT KNEE ARTHROPLASTY;  Surgeon: Gaynelle Arabian, MD;  Location: [facility];  Service: Orthopedics;  Laterality: Right;   Social History   Social History  . Marital status: Married    Spouse name: N/A  . Number of children: N/A  . Years of education: N/A   Social History Main Topics  . Smoking status: Never Smoker  . Smokeless tobacco: Never Used  . Alcohol use 2.5 oz/week    5 Standard drinks or equivalent per week     Comment: occasional  . Drug use: No  . Sexual activity: Yes    Partners: Male    Birth control/ protection: Other-see comments     Comment: vasectomy   Other Topics Concern  . Not on file   Social History Narrative   ** Merged History Encounter **       Allergies  Allergen Reactions  . Fibrin Sealant Component Other (See Comments)    Did not work. Wound came apart.  . Penicillins Hives    Has patient had a PCN reaction causing immediate rash, facial/tongue/throat swelling, SOB or  lightheadedness with hypotension: No Has patient had a PCN reaction causing severe rash involving mucus membranes or skin necrosis: No Has patient had a PCN reaction that required hospitalization No Has patient had a PCN reaction occurring within the last 10 years: No If all of the above answers are "NO", then may proceed with Cephalosporin use.   . Bupropion Hcl Itching and Rash   Family History  Problem Relation Age of Onset  . Hypertension Mother   . Osteoarthritis Mother   . Alzheimer's disease Mother   . Cancer Father        colon cancer  . Heart disease Father        tacycardia  . Diabetes Maternal Grandfather      Past medical history, social, surgical and family history all reviewed in electronic medical record.  No pertanent information unless stated regarding to the chief complaint.   Review of Systems:Review of systems updated and as accurate as of 01/27/17  No headache, visual changes, nausea, vomiting, diarrhea, constipation, dizziness, abdominal pain, skin rash, fevers, chills, night sweats, weight loss, swollen lymph nodes, body aches, joint swelling, muscle aches, chest pain, shortness of breath, mood changes.   Objective  Last menstrual period 01/04/2008.  Systems examined below as of 01/27/17   General: No apparent distress alert and oriented x3 mood and affect normal, dressed appropriately.  HEENT: Pupils equal, extraocular movements intact  Respiratory: Patient's speak in full sentences and does not appear short of breath  Cardiovascular: No lower extremity edema, non tender, no erythema  Skin: Warm dry intact with no signs of infection or rash on extremities or on axial skeleton.  Abdomen: Soft nontender  Neuro: Cranial nerves II through XII are intact, neurovascularly intact in all extremities with 2+ DTRs and 2+ pulses.  Lymph: No lymphadenopathy of posterior or anterior cervical chain or axillae bilaterally.  Gait normal with good balance and coordination.   MSK:  Non tender with full range of motion and good stability and symmetric strength and tone of shoulders, elbows, wrist, hip, knee and ankles bilaterally.     Impression and Recommendations:     This case required medical decision making of moderate complexity.      Note: This dictation was prepared with Dragon dictation along with smaller phrase technology. Any transcriptional errors that result from this process are unintentional.

## 2017-01-27 NOTE — Assessment & Plan Note (Addendum)
Healing has been shown on imaging.  She has been in the ulnar gutter splint for roughly 2 weeks.  Her injury was roughly 5 weeks ago. -We will continue the ulnar gutter splint for 2 more weeks.  Then she can wean out of this. -Advised to continue the ulnar gutter splint for the next 4-6 weeks with any significant activity. -She will follow-up in 4 weeks can reimage at that time.

## 2017-01-27 NOTE — Patient Instructions (Addendum)
Thank you for coming in,   Please follow up with me in 4 weeks.   You can start weaning out of your EXOS cast over the next two weeks.    Please feel free to call with any questions or concerns at any time, at [phone]. --Dr. Raeford Razor

## 2017-01-30 ENCOUNTER — Other Ambulatory Visit: Payer: Self-pay | Admitting: Internal Medicine

## 2017-02-09 ENCOUNTER — Ambulatory Visit: Payer: No Typology Code available for payment source | Admitting: Internal Medicine

## 2017-02-21 ENCOUNTER — Encounter: Payer: Self-pay | Admitting: Family Medicine

## 2017-02-21 ENCOUNTER — Ambulatory Visit (INDEPENDENT_AMBULATORY_CARE_PROVIDER_SITE_OTHER): Payer: No Typology Code available for payment source | Admitting: Family Medicine

## 2017-02-21 VITALS — BP 128/82 | Ht 64.5 in | Wt 200.0 lb

## 2017-02-21 DIAGNOSIS — S62316D Displaced fracture of base of fifth metacarpal bone, right hand, subsequent encounter for fracture with routine healing: Secondary | ICD-10-CM
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
## 2017-02-21 NOTE — Patient Instructions (Signed)
Thank you for coming in,      Please feel free to call with any questions or concerns at any time, at [phone]. --Dr. Rainier Feuerborn  

## 2017-02-21 NOTE — Assessment & Plan Note (Signed)
Routine healing is present. Has been in the ulnar gutter splint for 6 weeks. Initial injury was Sept 22 or 23 roughly 12 weeks. Went about 10 days with no treatment prior to first appt. Ultrasound today shows callus and almost full healing.  - Discontinue the splint today. Can try a cock up splint for comfort  - counseled on HEP -  referral to PT.  - can follow up in 2-3 weeks if any complications and can reimage.

## 2017-02-26 ENCOUNTER — Other Ambulatory Visit: Payer: Self-pay | Admitting: Internal Medicine

## 2017-02-26 DIAGNOSIS — M19071 Primary osteoarthritis, right ankle and foot: Secondary | ICD-10-CM

## 2017-02-26 DIAGNOSIS — M19042 Primary osteoarthritis, left hand: Secondary | ICD-10-CM

## 2017-02-26 DIAGNOSIS — M19072 Primary osteoarthritis, left ankle and foot: Secondary | ICD-10-CM

## 2017-02-26 DIAGNOSIS — M19041 Primary osteoarthritis, right hand: Secondary | ICD-10-CM

## 2017-02-27 ENCOUNTER — Other Ambulatory Visit: Payer: Self-pay | Admitting: Internal Medicine

## 2017-02-27 ENCOUNTER — Encounter: Payer: Self-pay | Admitting: Family Medicine

## 2017-02-28 NOTE — Telephone Encounter (Signed)
OV scheduled on 03/22/17, last filled on 12/20/16 #60 tabs with 2 additional refills, please advise

## 2017-03-01 ENCOUNTER — Other Ambulatory Visit: Payer: Self-pay | Admitting: Family Medicine

## 2017-03-01 DIAGNOSIS — S62316D Displaced fracture of base of fifth metacarpal bone, right hand, subsequent encounter for fracture with routine healing: Secondary | ICD-10-CM

## 2017-03-03 ENCOUNTER — Encounter: Payer: Self-pay | Admitting: Family Medicine

## 2017-03-07 ENCOUNTER — Ambulatory Visit: Payer: No Typology Code available for payment source | Attending: Family Medicine | Admitting: Occupational Therapy

## 2017-03-07 ENCOUNTER — Other Ambulatory Visit: Payer: Self-pay | Admitting: *Deleted

## 2017-03-07 DIAGNOSIS — M6281 Muscle weakness (generalized): Secondary | ICD-10-CM | POA: Diagnosis present

## 2017-03-07 DIAGNOSIS — M79644 Pain in right finger(s): Secondary | ICD-10-CM | POA: Insufficient documentation

## 2017-03-07 DIAGNOSIS — M25641 Stiffness of right hand, not elsewhere classified: Secondary | ICD-10-CM | POA: Diagnosis present

## 2017-03-07 DIAGNOSIS — S62316D Displaced fracture of base of fifth metacarpal bone, right hand, subsequent encounter for fracture with routine healing: Secondary | ICD-10-CM
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
## 2017-03-14 ENCOUNTER — Encounter: Payer: No Typology Code available for payment source | Admitting: Occupational Therapy

## 2017-03-22 ENCOUNTER — Ambulatory Visit (INDEPENDENT_AMBULATORY_CARE_PROVIDER_SITE_OTHER): Payer: No Typology Code available for payment source | Admitting: Internal Medicine

## 2017-03-22 ENCOUNTER — Other Ambulatory Visit (INDEPENDENT_AMBULATORY_CARE_PROVIDER_SITE_OTHER): Payer: No Typology Code available for payment source

## 2017-03-22 ENCOUNTER — Encounter: Payer: Self-pay | Admitting: Internal Medicine

## 2017-03-22 VITALS — BP 124/82 | HR 53 | Temp 98.5°F | Ht 64.5 in | Wt 201.0 lb

## 2017-03-22 DIAGNOSIS — M19072 Primary osteoarthritis, left ankle and foot: Secondary | ICD-10-CM

## 2017-03-22 DIAGNOSIS — Z0000 Encounter for general adult medical examination without abnormal findings: Secondary | ICD-10-CM

## 2017-03-22 DIAGNOSIS — F988 Other specified behavioral and emotional disorders with onset usually occurring in childhood and adolescence: Secondary | ICD-10-CM

## 2017-03-22 DIAGNOSIS — S62316D Displaced fracture of base of fifth metacarpal bone, right hand, subsequent encounter for fracture with routine healing: Secondary | ICD-10-CM

## 2017-03-22 DIAGNOSIS — M19041 Primary osteoarthritis, right hand: Secondary | ICD-10-CM

## 2017-03-22 DIAGNOSIS — M19042 Primary osteoarthritis, left hand: Secondary | ICD-10-CM | POA: Diagnosis not present

## 2017-03-22 DIAGNOSIS — M19071 Primary osteoarthritis, right ankle and foot: Secondary | ICD-10-CM

## 2017-03-22 LAB — LIPID PANEL
CHOLESTEROL: 190 mg/dL (ref 0–200)
HDL: 60.9 mg/dL (ref >39.00)
LDL Cholesterol: 102 mg/dL — ABNORMAL HIGH (ref 0–99)
NonHDL: 128.69
Total CHOL/HDL Ratio: 3
Triglycerides: 134 mg/dL (ref 0.0–149.0)
VLDL: 26.8 mg/dL (ref 0.0–40.0)

## 2017-03-22 LAB — CBC
HEMATOCRIT: 42.4 % (ref 36.0–46.0)
Hemoglobin: 14.1 g/dL (ref 12.0–15.0)
MCHC: 33.3 g/dL (ref 30.0–36.0)
MCV: 91 fl (ref 78.0–100.0)
PLATELETS: 216 10*3/uL (ref 150.0–400.0)
RBC: 4.66 Mil/uL (ref 3.87–5.11)
RDW: 13.5 % (ref 11.5–15.5)
WBC: 4.6 10*3/uL (ref 4.0–10.5)

## 2017-03-22 LAB — COMPREHENSIVE METABOLIC PANEL
ALBUMIN: 4.3 g/dL (ref 3.5–5.2)
ALK PHOS: 65 U/L (ref 39–117)
ALT: 17 U/L (ref 0–35)
AST: 18 U/L (ref 0–37)
BUN: 14 mg/dL (ref 6–23)
CALCIUM: 9.1 mg/dL (ref 8.4–10.5)
CO2: 28 mEq/L (ref 19–32)
CREATININE: 0.71 mg/dL (ref 0.40–1.20)
Chloride: 103 mEq/L (ref 96–112)
GFR: 88.95 mL/min (ref >60.00)
Glucose, Bld: 104 mg/dL — ABNORMAL HIGH (ref 70–99)
Potassium: 4.3 mEq/L (ref 3.5–5.1)
SODIUM: 140 meq/L (ref 135–145)
Total Bilirubin: 0.8 mg/dL (ref 0.2–1.2)
Total Protein: 7.4 g/dL (ref 6.0–8.3)

## 2017-03-22 LAB — VITAMIN D 25 HYDROXY (VIT D DEFICIENCY, FRACTURES): VITD: 25.8 ng/mL — ABNORMAL LOW (ref 30.00–100.00)

## 2017-03-22 MED ORDER — FLUOXETINE HCL 20 MG PO CAPS: 20.0000 mg | ORAL_CAPSULE | Freq: Every day | ORAL | 3 refills | Status: DC

## 2017-03-22 MED ORDER — MELOXICAM 7.5 MG PO TABS: ORAL_TABLET | ORAL | 11 refills | Status: DC

## 2017-03-22 NOTE — Assessment & Plan Note (Signed)
Has barely started therapy and will continue. Still having some pain and using meloxicam which is refilled today.

## 2017-03-22 NOTE — Progress Notes (Signed)
   Subjective:    Patient ID: Susan Esparza, female    DOB: 08/03/1955, 61 y.o.   MRN: [id number]  HPI The patient is a 61 YO female coming in for wellness. Follow up of her medical conditions as per A/P.  PMH, [facility], social history reviewed and updated.   Review of Systems  Constitutional: Negative.   HENT: Negative.   Eyes: Negative.   Respiratory: Negative for cough, chest tightness and shortness of breath.   Cardiovascular: Negative for chest pain, palpitations and leg swelling.  Gastrointestinal: Negative for abdominal distention, abdominal pain, constipation, diarrhea, nausea and vomiting.  Musculoskeletal: Negative.   Skin: Negative.   Neurological: Negative.   Psychiatric/Behavioral: Negative.       Objective:   Physical Exam  Constitutional: She is oriented to person, place, and time. She appears well-developed and well-nourished.  HENT:  Head: Normocephalic and atraumatic.  Eyes: EOM are normal.  Neck: Normal range of motion.  Cardiovascular: Normal rate and regular rhythm.  Pulmonary/Chest: Effort normal and breath sounds normal. No respiratory distress. She has no wheezes. She has no rales.  Abdominal: Soft. Bowel sounds are normal. She exhibits no distension. There is no tenderness. There is no rebound.  Musculoskeletal: She exhibits no edema.  Neurological: She is alert and oriented to person, place, and time. Coordination normal.  Skin: Skin is warm and dry.  Psychiatric: She has a normal mood and affect.   Vitals:   03/22/17 0801  BP: 124/82  Pulse: (!) 53  Temp: 98.5 F (36.9 C)  TempSrc: Oral  SpO2: 99%  Weight: 201 lb (91.2 kg)  Height: 5' 4.5" (1.638 m)      Assessment & Plan:

## 2017-03-22 NOTE — Patient Instructions (Signed)

## 2017-03-22 NOTE — Assessment & Plan Note (Signed)
Second shingrix due in January and she will get. Flu and tetanus up to date. Colonoscopy and mammogram up to date. Counseled about safe bike helmet usage. Counseled on sun safety and mole surveillance as well as hearing protection. Given screening recommendations.

## 2017-03-22 NOTE — Assessment & Plan Note (Signed)
Doing well on therapy. Rare ativan (30 per year). Taking prozac 20 mg daily and considering tapering. She will let us know on mychart if she wishes to do that. Refill prozac as needed.

## 2017-03-24 ENCOUNTER — Ambulatory Visit: Payer: No Typology Code available for payment source | Admitting: Occupational Therapy

## 2017-03-24 DIAGNOSIS — M25641 Stiffness of right hand, not elsewhere classified: Secondary | ICD-10-CM

## 2017-03-24 DIAGNOSIS — M79644 Pain in right finger(s): Secondary | ICD-10-CM | POA: Diagnosis not present

## 2017-03-24 DIAGNOSIS — M6281 Muscle weakness (generalized): Secondary | ICD-10-CM

## 2017-03-24 NOTE — Patient Instructions (Signed)
AROM: PIP Flexion / Extension    Pinch bottom knuckle of ___small_____ finger of right hand to prevent bending. Actively bend middle knuckle until stretch is felt. Hold _3___ seconds. Relax. Straighten finger as far as possible. Repeat _10___ times per set. Do __1__ sets per session. Do __4-6__ sessions per day.  PIP / DIP Composite Flexion (Passive Stretch)    Use other hand to bend middle and tip joints of __small____ finger. Hold __10__ seconds. Repeat __5__ times. Do __4-6__ sessions per day.     PIP Extension (Active Controlled With Wrist and MP Flexion)    Using other hand to hold wrist neutral and big knuckle at __80__ flexion, straighten end joints of fingers. Repeat __10__ times. Do _4-6___ sessions per day.   1. Grip Strengthening (Resistive Putty)   Squeeze putty using thumb and all fingers. Repeat _15__ times. Do __2__ sessions per day.   2. Roll putty into tube on table and pinch between each finger and thumb x 10 reps each. (Do ring and small finger together)   3. IP Fisting (Resistive Putty)    Keeping knuckles straight, bend fingertips to squeeze putty. Repeat _10___ times. Do __2__ sessions per day.

## 2017-03-24 NOTE — Therapy (Signed)
[facility] [address] [place], [place], [address] Phone: [phone]   Fax:  [phone]  Occupational Therapy Treatment  Patient Details  Name: Susan Esparza MRN: [id number] Date of Birth: 04-16-1955 Referring Provider (Historical): Dr. Clearance Coots   Encounter Date: 03/24/2017  OT End of Session - 03/24/17 1558    Visit Number  2    Number of Visits  8    Date for OT Re-Evaluation  04/07/17    Authorization Type  [employer]/ [employer]    OT Start Time  1445    OT Stop Time  1543    OT Time Calculation (min)  58 min    Activity Tolerance  Patient tolerated treatment well       Past Medical History:  Diagnosis Date  . Allergic rhinitis   . Anxiety   . Arthritis   . Chronic dryness of both eyes   . Depression   . DVT (deep vein thrombosis) in pregnancy [facility]) 2009   left leg after knee Arthroscopy  . Dysrhythmia    HX of "Bigemini"  . Numbness    rt hand  . Sleep apnea    "borderline"  uses c-pap    Past Surgical History:  Procedure Laterality Date  . ENDOMETRIAL ABLATION  2006  . FACIAL COSMETIC SURGERY  2006  . KNEE ARTHROSCOPY Right 1989  . KNEE ARTHROSCOPY Left 2009  . NASAL POLYP SURGERY  01/2008      . TOTAL KNEE ARTHROPLASTY Right 02/24/2015   Procedure: TOTAL RIGHT KNEE ARTHROPLASTY;  Surgeon: Gaynelle Arabian, MD;  Location: [facility];  Service: Orthopedics;  Laterality: Right;    There were no vitals filed for this visit.  Subjective Assessment - 03/24/17 1452    Subjective   It's better. 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 ulnar neuropathy Rt elbow, Rt TKR    Patient Stated Goals  Get my Rt hand better    Currently in Pain?  Yes    Pain Score  2     Pain Location  -- small finger    Pain Orientation  Right    Pain Descriptors / Indicators  Aching    Pain Onset  More than a month ago    Pain Frequency  Intermittent    Aggravating Factors    extreme motion    Pain Relieving Factors  resting position                   OT Treatments/Exercises (OP) - 03/24/17 0001      ADLs   Writing  Practiced writing w/ regular pen, built up pen, and Pen Again. Pt preferred Pen Again. Pt instructed on where/how to purchase if interested    ADL Comments  Discussed joint protection strategies and task modifications as well as A/E recommendations for pain management and joint protection d/t OA. Pt issued arthritis booklet and reviewed - however will review further at next session d/t time constraints. Therapist demo different ways to hold pots/pans, wring out washcloth, open doors/jars.       Hand Exercises   Other Hand Exercises  Pt issued blocking, reverse blocking, P/ROM and putty HEP - see pt instructions for details. Pt demo each. Pt issued red resistance putty      Modalities   Modalities  Fluidotherapy      RUE Fluidotherapy   Number Minutes Fluidotherapy  12 Minutes  RUE Fluidotherapy Location  Hand;Wrist    Comments  at beginning of session to decr. pain/stiffness             OT Education - 03/24/17 1500    Education provided  Yes    Education Details  Blocking ex's and putty HEP, joint protection, task modifications, A/E recommendations     Person(s) Educated  Patient    Methods  Explanation;Demonstration;Handout    Comprehension  Verbalized understanding;Returned demonstration          OT Long Term Goals - 03/24/17 1558      OT LONG TERM GOAL #1   Title  Independent with HEP     Time  4    Period  Weeks    Status  Achieved      OT LONG TERM GOAL #2   Title  Pt to verbalize understanding with pain management strategies including use of modalities, joint protection, task modifications, and A/E prn    Time  4    Period  Weeks    Status  On-going      OT LONG TERM GOAL #3   Title  Pt to improve Rt small finger PIP flexion to 95 degrees or greater    Baseline  85*    Time  4    Period  Weeks     Status  On-going      OT LONG TERM GOAL #4   Title  Pt to improve grip strength Rt dominant hand to 52 lbs. to open jars/containers with greater ease    Baseline  Rt = 46 lbs (Lt = 60 lbs)     Time  4    Period  Weeks    Status  On-going      OT LONG TERM GOAL #5   Title  Pt to report greater ease with writing, hooking bra, cooking with Rt hand as dominant hand    Time  4    Period  Weeks    Status  On-going            Plan - 03/24/17 1559    Clinical Impression Statement  Pt with greater understanding of joint protection techniques and task modifications for pain management and joint protection.     Occupational Profile and client history currently impacting functional performance  OA especially at MP joints with ulnar drift, as well as symptoms of ulnar neuropathy Rt hand which limit pt's ability to use Rt hand as dominant hand for ADLS/IADLS    Rehab Potential  Good    OT Frequency  2x / week    OT Duration  4 weeks anticipate only 1x/wk needed    OT Treatment/Interventions  Self-care/ADL training;Moist Heat;Fluidtherapy;DME and/or AE instruction;Splinting;Contrast Bath;Therapeutic activities;Ultrasound;Therapeutic exercise;Cryotherapy;Passive range of motion;Electrical Stimulation;Paraffin;Manual Therapy;Patient/family education    Plan  continue fluidotherapy, continue review of arthritis booklet, begin assessing remaining LTG's as pt may wish to d/c next session. (pt only scheduled for 1 more appt)    Consulted and Agree with Plan of Care  Patient       Patient will benefit from skilled therapeutic intervention in order to improve the following deficits and impairments:  Decreased range of motion, Increased edema, Impaired sensation, Decreased knowledge of use of DME, Impaired UE functional use, Pain, Decreased strength  Visit Diagnosis: Pain in right finger(s)  Stiffness of right hand, not elsewhere classified  Muscle weakness (generalized)    Problem List Patient  Active Problem List   Diagnosis Date Noted  .  Displaced fracture of base of fifth metacarpal bone, right hand, subsequent encounter for fracture with routine healing 01/06/2017  . Overweight (BMI 25.0-29.9) 03/20/2015  . OA (osteoarthritis) of knee 02/24/2015  . Routine general medical examination at a health care facility 09/18/2014  . Attention deficit disorder 03/23/2010  . DVT 06/07/2007    [name], OTR/L 03/24/2017, 4:02 PM  [facility] [address] [place], [place], [address] Phone: [phone]   Fax:  [phone]  Name: Susan Esparza MRN: [id number] Date of Birth: 03/19/1956

## 2017-04-01 ENCOUNTER — Telehealth: Payer: Self-pay | Admitting: Internal Medicine

## 2017-04-01 NOTE — Telephone Encounter (Signed)
This appt will be for patient's 2nd injection---vaccine already labeled and placed in refrig

## 2017-04-01 NOTE — Telephone Encounter (Signed)
I was looking at nurse sch and patient has an appointment for Shingrix Vaccine on 1/14, please advise if this is ok and if you have enough

## 2017-04-12 ENCOUNTER — Ambulatory Visit: Payer: No Typology Code available for payment source | Attending: Family Medicine | Admitting: Occupational Therapy

## 2017-04-12 DIAGNOSIS — M6281 Muscle weakness (generalized): Secondary | ICD-10-CM | POA: Diagnosis present

## 2017-04-12 DIAGNOSIS — M79644 Pain in right finger(s): Secondary | ICD-10-CM | POA: Diagnosis present

## 2017-04-12 DIAGNOSIS — M25641 Stiffness of right hand, not elsewhere classified: Secondary | ICD-10-CM

## 2017-04-12 NOTE — Therapy (Signed)
[facility] [address] [place], [place], [address] Phone: [phone]   Fax:  [phone]  Occupational Therapy Treatment  Patient Details  Name: Susan Esparza MRN: [id number] Date of Birth: 1955-09-05 Referring Provider (Historical): Dr. Clearance Coots   Encounter Date: 04/12/2017  OT End of Session - 04/12/17 1156    Visit Number  3    Number of Visits  8    Date for OT Re-Evaluation  04/07/17    Authorization Type  [employer]/ [employer]    OT Start Time  1105    OT Stop Time  1145    OT Time Calculation (min)  40 min    Activity Tolerance  Patient tolerated treatment well       Past Medical History:  Diagnosis Date  . Allergic rhinitis   . Anxiety   . Arthritis   . Chronic dryness of both eyes   . Depression   . DVT (deep vein thrombosis) in pregnancy [facility]) 2009   left leg after knee Arthroscopy  . Dysrhythmia    HX of "Bigemini"  . Numbness    rt hand  . Sleep apnea    "borderline"  uses c-pap    Past Surgical History:  Procedure Laterality Date  . ENDOMETRIAL ABLATION  2006  . FACIAL COSMETIC SURGERY  2006  . KNEE ARTHROSCOPY Right 1989  . KNEE ARTHROSCOPY Left 2009  . NASAL POLYP SURGERY  01/2008      . TOTAL KNEE ARTHROPLASTY Right 02/24/2015   Procedure: TOTAL RIGHT KNEE ARTHROPLASTY;  Surgeon: Gaynelle Arabian, MD;  Location: [facility];  Service: Orthopedics;  Laterality: Right;    There were no vitals filed for this visit.  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 ulnar neuropathy Rt elbow, Rt TKR    Patient Stated Goals  Get my Rt hand better    Currently in Pain?  No/denies                   OT Treatments/Exercises (OP) - 04/12/17 0001      ADLs   ADL Comments  While pt on heat Rt hand, further discussed pain management, task modifications, A/E recommendations and joint  protection. Also discussed use of paraffin and how to properly use if pt wishes to purchase for home. Pt had questions about numbness and therapist recommended following up with her sports medicine doctor first, and then if needed a possible neurologist for nerve conduction study. Pt had generalized numbness not consistent with any of the 3 nerves to the hand.  Further reviewed arthritis booklet. Then assessed remaining goals for d/c      Modalities   Modalities  Moist Heat      Moist Heat Therapy   Number Minutes Moist Heat  15 Minutes    Moist Heat Location  Hand Rt side. Unable to do fluidotherapy d/t open wound area                  OT Long Term Goals - 04/12/17 1156      OT LONG TERM GOAL #1   Title  Independent with HEP     Time  4    Period  Weeks    Status  Achieved      OT LONG TERM GOAL #2   Title  Pt to verbalize understanding  with pain management strategies including use of modalities, joint protection, task modifications, and A/E prn    Time  4    Period  Weeks    Status  Achieved      OT LONG TERM GOAL #3   Title  Pt to improve Rt small finger PIP flexion to 95 degrees or greater    Baseline  85*    Time  4    Period  Weeks    Status  Achieved 04/12/17: 95 degrees      OT LONG TERM GOAL #4   Title  Pt to improve grip strength Rt dominant hand to 52 lbs. to open jars/containers with greater ease    Baseline  Rt = 46 lbs (Lt = 60 lbs)     Time  4    Period  Weeks    Status  Achieved 53 lbs      OT LONG TERM GOAL #5   Title  Pt to report greater ease with writing, hooking bra, cooking with Rt hand as dominant hand    Time  4    Period  Weeks    Status  Achieved            Plan - 04/12/17 1157    Clinical Impression Statement  Pt has met all goals and feels fine with d/c today. Pt to pursue with MD continued intermittent numbness in hand    Occupational Profile and client history currently impacting functional performance  OA especially at MP  joints with ulnar drift, as well as symptoms of ulnar neuropathy Rt hand which limit pt's ability to use Rt hand as dominant hand for ADLS/IADLS    OT Treatment/Interventions  Self-care/ADL training;Moist Heat;Fluidtherapy;DME and/or AE instruction;Splinting;Contrast Bath;Therapeutic activities;Ultrasound;Therapeutic exercise;Cryotherapy;Passive range of motion;Electrical Stimulation;Paraffin;Manual Therapy;Patient/family education    Plan  D/C O.T. at this time    Consulted and Agree with Plan of Care  Patient       Patient will benefit from skilled therapeutic intervention in order to improve the following deficits and impairments:  Decreased range of motion, Increased edema, Impaired sensation, Decreased knowledge of use of DME, Impaired UE functional use, Pain, Decreased strength  Visit Diagnosis: Stiffness of right hand, not elsewhere classified  Muscle weakness (generalized)  Pain in right finger(s)    Problem List Patient Active Problem List   Diagnosis Date Noted  . Displaced fracture of base of fifth metacarpal bone, right hand, subsequent encounter for fracture with routine healing 01/06/2017  . Overweight (BMI 25.0-29.9) 03/20/2015  . OA (osteoarthritis) of knee 02/24/2015  . Routine general medical examination at a health care facility 09/18/2014  . Attention deficit disorder 03/23/2010  . DVT 06/07/2007     OCCUPATIONAL THERAPY DISCHARGE SUMMARY  Visits from Start of Care: 3  Current functional level related to goals / functional outcomes: See above - pt met all goals   Remaining deficits: Numbness Rt hand Joint pain from OA   Education / Equipment: HEP, A/E recommendations, task modifications, joint protection techniques, pain management  Plan: Patient agrees to discharge.  Patient goals were met. Patient is being discharged due to meeting the stated rehab goals.  ?????      Susan Esparza, OTR/L 04/12/2017, 11:58 AM  [name] [address] [place], [place], [address] Phone: [phone]   Fax:  [phone]  Name: Susan Esparza MRN: [id number] Date of Birth: 06-06-1955
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
## 2017-04-18 ENCOUNTER — Ambulatory Visit: Payer: No Typology Code available for payment source

## 2017-04-18 ENCOUNTER — Ambulatory Visit (INDEPENDENT_AMBULATORY_CARE_PROVIDER_SITE_OTHER): Payer: No Typology Code available for payment source

## 2017-04-18 ENCOUNTER — Ambulatory Visit: Payer: Self-pay

## 2017-04-18 ENCOUNTER — Other Ambulatory Visit (INDEPENDENT_AMBULATORY_CARE_PROVIDER_SITE_OTHER): Payer: No Typology Code available for payment source

## 2017-04-18 ENCOUNTER — Encounter: Payer: Self-pay | Admitting: Family Medicine

## 2017-04-18 ENCOUNTER — Ambulatory Visit (INDEPENDENT_AMBULATORY_CARE_PROVIDER_SITE_OTHER): Payer: No Typology Code available for payment source | Admitting: Family Medicine

## 2017-04-18 VITALS — BP 150/84 | HR 55 | Ht 66.0 in

## 2017-04-18 DIAGNOSIS — M79641 Pain in right hand: Secondary | ICD-10-CM

## 2017-04-18 DIAGNOSIS — Z299 Encounter for prophylactic measures, unspecified: Secondary | ICD-10-CM

## 2017-04-18 DIAGNOSIS — R768 Other specified abnormal immunological findings in serum: Secondary | ICD-10-CM

## 2017-04-18 DIAGNOSIS — M659 Synovitis and tenosynovitis, unspecified: Secondary | ICD-10-CM | POA: Diagnosis not present

## 2017-04-18 DIAGNOSIS — M7672 Peroneal tendinitis, left leg: Secondary | ICD-10-CM | POA: Diagnosis not present

## 2017-04-18 LAB — COMPREHENSIVE METABOLIC PANEL
ALT: 15 U/L (ref 0–35)
AST: 14 U/L (ref 0–37)
Albumin: 4.4 g/dL (ref 3.5–5.2)
Alkaline Phosphatase: 69 U/L (ref 39–117)
BILIRUBIN TOTAL: 0.7 mg/dL (ref 0.2–1.2)
BUN: 15 mg/dL (ref 6–23)
CALCIUM: 9.5 mg/dL (ref 8.4–10.5)
CO2: 28 meq/L (ref 19–32)
Chloride: 101 mEq/L (ref 96–112)
Creatinine, Ser: 0.69 mg/dL (ref 0.40–1.20)
GFR: 91.9 mL/min (ref >60.00)
GLUCOSE: 98 mg/dL (ref 70–99)
Potassium: 4.1 mEq/L (ref 3.5–5.1)
Sodium: 138 mEq/L (ref 135–145)
Total Protein: 7.6 g/dL (ref 6.0–8.3)

## 2017-04-18 LAB — CBC WITH DIFFERENTIAL/PLATELET
BASOS ABS: 0.1 10*3/uL (ref 0.0–0.1)
Basophils Relative: 0.9 % (ref 0.0–3.0)
EOS ABS: 0.2 10*3/uL (ref 0.0–0.7)
Eosinophils Relative: 2.9 % (ref 0.0–5.0)
HCT: 41.7 % (ref 36.0–46.0)
Hemoglobin: 14 g/dL (ref 12.0–15.0)
LYMPHS ABS: 1.6 10*3/uL (ref 0.7–4.0)
Lymphocytes Relative: 28.4 % (ref 12.0–46.0)
MCHC: 33.5 g/dL (ref 30.0–36.0)
MCV: 90.8 fl (ref 78.0–100.0)
Monocytes Absolute: 0.4 10*3/uL (ref 0.1–1.0)
Monocytes Relative: 6.9 % (ref 3.0–12.0)
NEUTROS ABS: 3.4 10*3/uL (ref 1.4–7.7)
NEUTROS PCT: 60.9 % (ref 43.0–77.0)
PLATELETS: 221 10*3/uL (ref 150.0–400.0)
RBC: 4.59 Mil/uL (ref 3.87–5.11)
RDW: 13.2 % (ref 11.5–15.5)
WBC: 5.6 10*3/uL (ref 4.0–10.5)

## 2017-04-18 LAB — TSH: TSH: 2.44 u[IU]/mL (ref 0.35–4.50)

## 2017-04-18 LAB — SEDIMENTATION RATE: Sed Rate: 13 mm/hr (ref 0–30)

## 2017-04-18 LAB — URIC ACID: Uric Acid, Serum: 6.1 mg/dL (ref 2.4–7.0)

## 2017-04-18 LAB — IBC PANEL
Iron: 112 ug/dL (ref 42–145)
SATURATION RATIOS: 27.1 % (ref 20.0–50.0)
Transferrin: 295 mg/dL (ref 212.0–360.0)

## 2017-04-18 LAB — C-REACTIVE PROTEIN: CRP: 0.3 mg/dL — AB (ref 0.5–20.0)

## 2017-04-18 MED ORDER — VITAMIN D (ERGOCALCIFEROL) 1.25 MG (50000 UNIT) PO CAPS: ORAL_CAPSULE | ORAL | 0 refills | Status: DC

## 2017-04-18 MED ORDER — GABAPENTIN 100 MG PO CAPS: 200.0000 mg | ORAL_CAPSULE | Freq: Every day | ORAL | 3 refills | Status: DC

## 2017-04-18 NOTE — Assessment & Plan Note (Signed)
1.  Peroneal tendinitis.  We discussed icing regimen and home exercises.  We discussed which activities to do.  Patient was given an Aircast that I think will be beneficial for avoiding lateral translation of the ankle.  Patient will come back and see me again in 4-6 weeks

## 2017-04-18 NOTE — Patient Instructions (Addendum)
Good to see you  We will get labs today  Ice is your friend Ice 20 minutes 2 times daily. Usually after activity and before bed. pennsaid pinkie amount topically 2 times daily as needed.  Gabapentin 200mg  at night Vitamin D increase to 2 times a week.  Over the counter get Turmeric 500mg  twice daily  See me again in 3 weeks.

## 2017-04-18 NOTE — Assessment & Plan Note (Signed)
Patient has significant synovitis of the wrist.  Patient has that of the finger as well.  Concerned that patient may have an underlying working day.  We discussed icing regimen, home exercise, which activities of doing which wants to avoid.  Patient did need osteoarthritis of the knee and had a replacement previously.  History of a deep venous thrombosis as well in the past.  These are all concerning for the autoimmune aspect.  We discussed home exercise, icing regimen, patient will follow up with me again in 2-3 weeks.

## 2017-04-20 ENCOUNTER — Other Ambulatory Visit: Payer: Self-pay | Admitting: *Deleted

## 2017-04-21 LAB — RHEUMATOID FACTOR: Rhuematoid fact SerPl-aCnc: <14 IU/mL (ref <14)

## 2017-04-21 LAB — PTH, INTACT AND CALCIUM
CALCIUM: 9.7 mg/dL (ref 8.6–10.4)
PTH: 43 pg/mL (ref 14–64)

## 2017-04-21 LAB — CYCLIC CITRUL PEPTIDE ANTIBODY, IGG: Cyclic Citrullin Peptide Ab: <16 UNITS

## 2017-04-21 LAB — SJOGRENS SYNDROME-B EXTRACTABLE NUCLEAR ANTIBODY: SSB (La) (ENA) Antibody, IgG: <1 AI

## 2017-04-21 LAB — ANGIOTENSIN CONVERTING ENZYME: ANGIOTENSIN-CONVERTING ENZYME: 41 U/L (ref 9–67)

## 2017-04-21 LAB — VITAMIN D 1,25 DIHYDROXY
VITAMIN D2 1, 25 (OH): 23 pg/mL
VITAMIN D3 1, 25 (OH): 24 pg/mL
Vitamin D 1, 25 (OH)2 Total: 47 pg/mL (ref 18–72)

## 2017-04-21 LAB — ANCA SCREEN W REFLEX TITER
ANCA Screen: POSITIVE — AB
Atypical P-ANCA titer: 1:80 {titer} — ABNORMAL HIGH

## 2017-04-21 LAB — CALCIUM, IONIZED: Calcium, Ion: 5.1 mg/dL (ref 4.8–5.6)

## 2017-04-21 LAB — ANA: Anti Nuclear Antibody(ANA): NEGATIVE

## 2017-04-21 LAB — SJOGRENS SYNDROME-A EXTRACTABLE NUCLEAR ANTIBODY: SSA (RO) (ENA) ANTIBODY, IGG: NEGATIVE AI

## 2017-04-26 ENCOUNTER — Encounter: Payer: Self-pay | Admitting: Obstetrics & Gynecology

## 2017-05-16 NOTE — Progress Notes (Signed)
[name] [address] [place], [place] [address] Phone: [phone] Subjective:     CC: Right hand pain and ankle pain follow-up  HCW:[id number]  Susan Esparza is a 62 y.o. female coming in with complaint of  Right hand pain.  Concern for potential carpal tunnel.  Patient wants to do bracing, home exercise, icing regimen.  Patient states 100% better.  No numbness at this point.  Left ankle also had pain.  Found to have more of a peroneal tendinitis.  Given home exercises, discussed bracing, topical anti-inflammatories.  Patient states about 80% better.  Still some mild discomfort from time to time but nothing severe.   Patient did have a polyarthralgia workup.  Was found to have a positive P Anka.  Seen rheumatology today.  Past Medical History:  Diagnosis Date  . Allergic rhinitis   . Anxiety   . Arthritis   . Chronic dryness of both eyes   . Depression   . DVT (deep vein thrombosis) in pregnancy [facility]) 2009   left leg after knee Arthroscopy  . Dysrhythmia    HX of "Bigemini"  . Numbness    rt hand  . Sleep apnea    "borderline"  uses c-pap   Past Surgical History:  Procedure Laterality Date  . ENDOMETRIAL ABLATION  2006  . FACIAL COSMETIC SURGERY  2006  . KNEE ARTHROSCOPY Right 1989  . KNEE ARTHROSCOPY Left 2009  . NASAL POLYP SURGERY  01/2008      . TOTAL KNEE ARTHROPLASTY Right 02/24/2015   Procedure: TOTAL RIGHT KNEE ARTHROPLASTY;  Surgeon: Gaynelle Arabian, MD;  Location: [facility];  Service: Orthopedics;  Laterality: Right;   Social History   Socioeconomic History  . Marital status: Married    Spouse name: None  . Number of children: None  . Years of education: None  . Highest education level: None  Social Needs  . Financial resource strain: None  . Food insecurity - worry: None  . Food insecurity - inability: None  . Transportation needs - medical: None  . Transportation needs - non-medical: None  Occupational History  .  None  Tobacco Use  . Smoking status: Never Smoker  . Smokeless tobacco: Never Used  Substance and Sexual Activity  . Alcohol use: Yes    Alcohol/week: 2.5 oz    Types: 5 Standard drinks or equivalent per week    Comment: occasional  . Drug use: No  . Sexual activity: Yes    Partners: Male    Birth control/protection: Other-see comments    Comment: vasectomy  Other Topics Concern  . None  Social History Narrative   ** Merged History Encounter **       Allergies  Allergen Reactions  . Fibrin Sealant Component Other (See Comments)    Did not work. Wound came apart.  . Penicillins Hives    Has patient had a PCN reaction causing immediate rash, facial/tongue/throat swelling, SOB or lightheadedness with hypotension: No Has patient had a PCN reaction causing severe rash involving mucus membranes or skin necrosis: No Has patient had a PCN reaction that required hospitalization No Has patient had a PCN reaction occurring within the last 10 years: No If all of the above answers are "NO", then may proceed with Cephalosporin use.   . Bupropion Hcl Itching and Rash   Family History  Problem Relation Age of Onset  . Hypertension Mother   . Osteoarthritis Mother   . Alzheimer's disease Mother   .  Cancer Father        colon cancer  . Heart disease Father        tacycardia  . Diabetes Maternal Grandfather      Past medical history, social, surgical and family history all reviewed in electronic medical record.  No pertanent information unless stated regarding to the chief complaint.   Review of Systems:Review of systems updated and as accurate as of 05/17/17  No headache, visual changes, nausea, vomiting, diarrhea, constipation, dizziness, abdominal pain, skin rash, fevers, chills, night sweats, weight loss, swollen lymph nodes, body aches, joint swelling,chest pain, shortness of breath, mood changes.  Positive muscle aches  Objective  Blood pressure (!) 144/72, pulse 72, height 5'  6" (1.676 m), weight 211 lb (95.7 kg), last menstrual period 01/04/2008, SpO2 97 %. Systems examined below as of 05/17/17   General: No apparent distress alert and oriented x3 mood and affect normal, dressed appropriately.  HEENT: Pupils equal, extraocular movements intact  Respiratory: Patient's speak in full sentences and does not appear short of breath  Cardiovascular: No lower extremity edema, non tender, no erythema  Skin: Warm dry intact with no signs of infection or rash on extremities or on axial skeleton.  Abdomen: Soft nontender  Neuro: Cranial nerves II through XII are intact, neurovascularly intact in all extremities with 2+ DTRs and 2+ pulses.  Lymph: No lymphadenopathy of posterior or anterior cervical chain or axillae bilaterally.  Gait normal with good balance and coordination.  MSK:  Non tender with full range of motion and good stability and symmetric strength and tone of shoulders, elbows, wrist, hip, knee bilaterally.   Ankle: Left No visible erythema or swelling. Range of motion is full in all directions. Strength is 5/5 in all directions. Stable lateral and medial ligaments; squeeze test and kleiger test unremarkable; Talar dome nontender; No pain at base of 5th MT; No tenderness over cuboid; No tenderness over N spot or navicular prominence No tenderness on posterior aspects of lateral and medial malleolus No sign of peroneal tendon subluxations or tenderness to palpation Negative tarsal tunnel tinel's Able to walk 4 steps. Contralateral ankle unremarkable   Impression and Recommendations:     This case required medical decision making of moderate complexity.      Note: This dictation was prepared with Dragon dictation along with smaller phrase technology. Any transcriptional errors that result from this process are unintentional.

## 2017-05-17 ENCOUNTER — Encounter: Payer: Self-pay | Admitting: Family Medicine

## 2017-05-17 ENCOUNTER — Ambulatory Visit (INDEPENDENT_AMBULATORY_CARE_PROVIDER_SITE_OTHER): Payer: No Typology Code available for payment source | Admitting: Family Medicine

## 2017-05-17 DIAGNOSIS — M7672 Peroneal tendinitis, left leg: Secondary | ICD-10-CM | POA: Diagnosis not present

## 2017-05-17 MED ORDER — ALBUTEROL SULFATE HFA 108 (90 BASE) MCG/ACT IN AERS: 2.0000 | INHALATION_SPRAY | RESPIRATORY_TRACT | 6 refills | Status: DC | PRN

## 2017-05-17 MED ORDER — DOXYCYCLINE HYCLATE 100 MG PO TABS: 100.0000 mg | ORAL_TABLET | Freq: Two times a day (BID) | ORAL | 0 refills | Status: AC

## 2017-05-17 NOTE — Assessment & Plan Note (Signed)
Patient is doing very well at this time.  Encourage patient continue the gabapentin if it is helpful.  Meloxicam as well.  We discussed icing regimen and home exercises.  Did have somewhat of a cold and given doxycycline.  Worsening symptoms come back sooner otherwise follow-up again in 2 months

## 2017-05-17 NOTE — Patient Instructions (Signed)
Good to see you  The ankle looks great  Keep the exercises 1-2 times a week  Lets see what rheumatology says.  Keep up on the vitamins Doxycycline 2 times a day for 10 days (for each) See me again in 2 months

## 2017-05-24 ENCOUNTER — Other Ambulatory Visit: Payer: Self-pay | Admitting: Family Medicine

## 2017-07-12 ENCOUNTER — Ambulatory Visit: Payer: No Typology Code available for payment source | Admitting: Family Medicine

## 2017-08-15 ENCOUNTER — Other Ambulatory Visit: Payer: Self-pay | Admitting: Family Medicine

## 2017-08-15 NOTE — Telephone Encounter (Signed)
Refill done.  

## 2017-08-16 ENCOUNTER — Other Ambulatory Visit: Payer: Self-pay

## 2017-08-16 ENCOUNTER — Encounter: Payer: Self-pay | Admitting: Obstetrics & Gynecology

## 2017-08-16 ENCOUNTER — Ambulatory Visit (INDEPENDENT_AMBULATORY_CARE_PROVIDER_SITE_OTHER): Payer: PRIVATE HEALTH INSURANCE | Admitting: Obstetrics & Gynecology

## 2017-08-16 ENCOUNTER — Encounter

## 2017-08-16 VITALS — BP 124/62 | HR 68 | Resp 14 | Ht 64.75 in | Wt 207.0 lb

## 2017-08-16 DIAGNOSIS — Z01419 Encounter for gynecological examination (general) (routine) without abnormal findings: Secondary | ICD-10-CM | POA: Diagnosis not present

## 2017-08-16 MED ORDER — VITAMIN D (ERGOCALCIFEROL) 1.25 MG (50000 UNIT) PO CAPS: ORAL_CAPSULE | ORAL | 4 refills | Status: DC
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
## 2017-11-11 ENCOUNTER — Other Ambulatory Visit: Payer: Self-pay | Admitting: Internal Medicine

## 2017-11-11 DIAGNOSIS — M19041 Primary osteoarthritis, right hand: Secondary | ICD-10-CM

## 2017-11-11 DIAGNOSIS — M19071 Primary osteoarthritis, right ankle and foot: Secondary | ICD-10-CM

## 2017-11-11 DIAGNOSIS — M19042 Primary osteoarthritis, left hand: Secondary | ICD-10-CM

## 2017-11-11 DIAGNOSIS — M19072 Primary osteoarthritis, left ankle and foot: Secondary | ICD-10-CM

## 2017-11-11 NOTE — Telephone Encounter (Signed)
Should still have refills. This same thing was sent in Dec 2018 for 1 year worth.

## 2018-01-03 HISTORY — PX: MOUTH SURGERY: SHX715

## 2018-02-01 ENCOUNTER — Telehealth: Payer: Self-pay | Admitting: Internal Medicine

## 2018-02-01 NOTE — Telephone Encounter (Signed)
Noted, patient should be able to just call without a referral unless insurance states otherwise

## 2018-02-01 NOTE — Telephone Encounter (Signed)
I have left a message for patient to call back.  I left message inquiring if patient has reached out to a podiatrist to schedule herself.  Informed patient that our office may have to have an OV before we could do a referral. Please advise.

## 2018-02-01 NOTE — Telephone Encounter (Signed)
Copied from [place] [id number]. Topic: Referral - Request for Referral >> Feb 01, 2018 10:25 AM Scherrie Gerlach wrote: Has patient seen PCP for this complaint? no  Referral for which specialty: podiatry Preferred provider/office: any Reason for referral: pt states she has an ingrown toenail. Would like referral to a podiatrist, unless Dr Sharlet Salina wants to address this issue

## 2018-02-09 ENCOUNTER — Ambulatory Visit (INDEPENDENT_AMBULATORY_CARE_PROVIDER_SITE_OTHER): Payer: PRIVATE HEALTH INSURANCE | Admitting: Podiatry

## 2018-02-09 ENCOUNTER — Other Ambulatory Visit: Payer: Self-pay | Admitting: Family Medicine

## 2018-02-09 ENCOUNTER — Encounter: Payer: Self-pay | Admitting: Podiatry

## 2018-02-09 DIAGNOSIS — L600 Ingrowing nail: Secondary | ICD-10-CM | POA: Diagnosis not present

## 2018-02-09 MED ORDER — NEOMYCIN-POLYMYXIN-HC 1 % OT SOLN: OTIC | 1 refills | Status: DC

## 2018-02-09 NOTE — Patient Instructions (Signed)

## 2018-02-09 NOTE — Progress Notes (Signed)
Subjective:  Patient ID: Susan Esparza, female    DOB: 01-05-1956,  MRN: [id number] HPI Chief Complaint  Patient presents with  . Nail Problem    Patient presents today for ingrown toenails bilat hallux medial borders x 2-3 months.  she reports the ingrowns come and go frequently and are tender to touch now.  She denies any redness or drainage.  the only treatment has been clipping nails out    62 y.o. female presents with the above complaint:  ROS: Denies fever chills nausea vomiting muscle aches pains calf pain back pain chest pain shortness of breath.  Past Medical History:  Diagnosis Date  . Allergic rhinitis   . Anxiety   . Arthritis   . Chronic dryness of both eyes   . Depression   . DVT (deep vein thrombosis) in pregnancy 2009   left leg after knee Arthroscopy  . Dysrhythmia    HX of "Bigemini"  . Numbness    rt hand  . Rheumatoid arthritis ([place]) 2018  . Sleep apnea    "borderline"  uses c-pap   Past Surgical History:  Procedure Laterality Date  . ENDOMETRIAL ABLATION  2006  . FACIAL COSMETIC SURGERY  2006  . KNEE ARTHROSCOPY Right 1989  . KNEE ARTHROSCOPY Left 2009  . NASAL POLYP SURGERY  01/2008      . TOTAL KNEE ARTHROPLASTY Right 02/24/2015   Procedure: TOTAL RIGHT KNEE ARTHROPLASTY;  Surgeon: Gaynelle Arabian, MD;  Location: [facility];  Service: Orthopedics;  Laterality: Right;    Current Outpatient Medications:  .  FLUoxetine (PROZAC) 20 MG capsule, Take 1 capsule (20 mg total) by mouth daily., Disp: 90 capsule, Rfl: 3 .  gabapentin (NEURONTIN) 100 MG capsule, TAKE 2 CAPSULES BY MOUTH AT BEDTIME., Disp: 180 capsule, Rfl: 2 .  hydroxychloroquine (PLAQUENIL) 200 MG tablet, Take 200 mg by mouth 2 (two) times daily., Disp: , Rfl: 0 .  meloxicam (MOBIC) 7.5 MG tablet, , Disp: , Rfl:  .  methotrexate 50 MG/2ML injection, Inject 0.6 mg into the skin once a week., Disp: , Rfl:  .  NEOMYCIN-POLYMYXIN-HYDROCORTISONE (CORTISPORIN) 1 % SOLN OTIC solution, Apply 1-2 drops  to toe BID after soaking, Disp: 10 mL, Rfl: 1 .  Vitamin D, Ergocalciferol, (DRISDOL) 50000 units CAPS capsule, TAKE ONE CAPSULE BY MOUTH TWICE A WEEK, Disp: 24 capsule, Rfl: 4  Allergies  Allergen Reactions  . Fibrin Sealant Component Other (See Comments)    Did not work. Wound came apart.  . Penicillins Hives    Has patient had a PCN reaction causing immediate rash, facial/tongue/throat swelling, SOB or lightheadedness with hypotension: No Has patient had a PCN reaction causing severe rash involving mucus membranes or skin necrosis: No Has patient had a PCN reaction that required hospitalization No Has patient had a PCN reaction occurring within the last 10 years: No If all of the above answers are "NO", then may proceed with Cephalosporin use.   . Bupropion Hcl Itching and Rash   Review of Systems Objective:  There were no vitals filed for this visit.  General: Well developed, nourished, in no acute distress, alert and oriented x3   Dermatological: Skin is warm, dry and supple bilateral. Nails x 10 are well maintained; remaining integument appears unremarkable at this time. There are no open sores, no preulcerative lesions, no rash or signs of infection present.  Sharp incurvated nail margin along the tibial border of the hallux bilateral with mild erythema no edema cellulitis drainage or odor.  Pain  on palpation sharp incurvated nail margin.  Vascular: Dorsalis Pedis artery and Posterior Tibial artery pedal pulses are 2/4 bilateral with immedate capillary fill time. Pedal hair growth present. No varicosities and no lower extremity edema present bilateral.   Neruologic: Grossly intact via light touch bilateral. Vibratory intact via tuning fork bilateral. Protective threshold with Semmes Wienstein monofilament intact to all pedal sites bilateral. Patellar and Achilles deep tendon reflexes 2+ bilateral. No Babinski or clonus noted bilateral.   Musculoskeletal: No gross boney pedal  deformities bilateral. No pain, crepitus, or limitation noted with foot and ankle range of motion bilateral. Muscular strength 5/5 in all groups tested bilateral.  Gait: Unassisted, Nonantalgic.    Radiographs:  None taken  Assessment & Plan:   Assessment: Ingrown toenails tibial borders hallux bilateral.  Plan: Discussed etiology pathology conservative surgical therapies at this point chemical matricectomy's were performed after local anesthesia was administered.  Tolerated procedure well without complications.  She was provided both oral and home-going instructions for the care and soaking of her toe as well as prescription for Cortisporin Otic to be applied twice daily after soaking.  I will follow-up with her in 1 month.     Max T. [place], [place]

## 2018-02-23 ENCOUNTER — Ambulatory Visit (INDEPENDENT_AMBULATORY_CARE_PROVIDER_SITE_OTHER): Payer: PRIVATE HEALTH INSURANCE

## 2018-02-23 DIAGNOSIS — L600 Ingrowing nail: Secondary | ICD-10-CM

## 2018-02-23 NOTE — Patient Instructions (Signed)

## 2018-02-27 NOTE — Progress Notes (Signed)
Patient is here for follow-up appointment, recent procedure performed on 02/09/2017, removal of bilateral ingrown hallux nails.  She states that overall she does not have any problems, and she feels like the areas are healing over well.  No redness, no erythema, no drainage, no swelling, no other signs and symptoms of infection.  The area is scabbing over and appears to be healing well.  Discussed signs and symptoms of infection.  Verbal and written instructions were given to patient.  She is to follow-up as needed with any acute symptom changes.

## 2018-03-08 LAB — HM MAMMOGRAPHY

## 2018-03-09 ENCOUNTER — Encounter: Payer: Self-pay | Admitting: Internal Medicine

## 2018-03-09 NOTE — Progress Notes (Signed)
Abstracted and sent to scan  

## 2018-03-10 ENCOUNTER — Other Ambulatory Visit: Payer: Self-pay | Admitting: Internal Medicine

## 2018-03-10 DIAGNOSIS — M19072 Primary osteoarthritis, left ankle and foot: Secondary | ICD-10-CM

## 2018-03-10 DIAGNOSIS — M19071 Primary osteoarthritis, right ankle and foot: Secondary | ICD-10-CM

## 2018-03-10 DIAGNOSIS — M19042 Primary osteoarthritis, left hand: Secondary | ICD-10-CM

## 2018-03-10 DIAGNOSIS — M19041 Primary osteoarthritis, right hand: Secondary | ICD-10-CM

## 2018-03-16 ENCOUNTER — Encounter: Payer: Self-pay | Admitting: Podiatry

## 2018-03-16 ENCOUNTER — Encounter: Payer: Self-pay | Admitting: Internal Medicine

## 2018-03-16 NOTE — Telephone Encounter (Signed)
I called pt and informed that the toe looked good. Pt states she is concerned that the cuticle area is hard. I asked pt if she was continuing to epsom salt soaks and pt stated she had been told not to continue the soaks. I told pt, that she had should shower like she regularly did, just before getting out of the shower take a clean washcloth, wet it then squeeze on an antibacterial soap and rub gently over the toenail area, rinse well, and pat dry apply antibiotic drop and cover with the bandaid, perform for 1 week. I told pt the gentle rubbing with the cloth and the soap would remove the thickened scab, and the drop would help keep the cuticle moist. Pt states understanding.

## 2018-04-09 ENCOUNTER — Other Ambulatory Visit: Payer: Self-pay | Admitting: Internal Medicine

## 2018-04-10 ENCOUNTER — Other Ambulatory Visit (INDEPENDENT_AMBULATORY_CARE_PROVIDER_SITE_OTHER): Payer: Managed Care, Other (non HMO)

## 2018-04-10 ENCOUNTER — Ambulatory Visit (INDEPENDENT_AMBULATORY_CARE_PROVIDER_SITE_OTHER): Payer: Managed Care, Other (non HMO) | Admitting: Internal Medicine

## 2018-04-10 ENCOUNTER — Encounter: Payer: Self-pay | Admitting: Internal Medicine

## 2018-04-10 VITALS — BP 118/82 | HR 64 | Temp 99.2°F | Ht 64.75 in | Wt 205.0 lb

## 2018-04-10 DIAGNOSIS — G4733 Obstructive sleep apnea (adult) (pediatric): Secondary | ICD-10-CM | POA: Insufficient documentation

## 2018-04-10 DIAGNOSIS — Z0000 Encounter for general adult medical examination without abnormal findings: Secondary | ICD-10-CM

## 2018-04-10 DIAGNOSIS — Z9989 Dependence on other enabling machines and devices: Secondary | ICD-10-CM | POA: Diagnosis not present

## 2018-04-10 DIAGNOSIS — F988 Other specified behavioral and emotional disorders with onset usually occurring in childhood and adolescence: Secondary | ICD-10-CM

## 2018-04-10 LAB — T4, FREE: Free T4: 0.6 ng/dL (ref 0.60–1.60)

## 2018-04-10 LAB — TSH: TSH: 2.83 u[IU]/mL (ref 0.35–4.50)

## 2018-04-10 LAB — LIPID PANEL
CHOL/HDL RATIO: 3
Cholesterol: 145 mg/dL (ref 0–200)
HDL: 52.2 mg/dL (ref >39.00)
LDL Cholesterol: 72 mg/dL (ref 0–99)
NONHDL: 93.01
Triglycerides: 103 mg/dL (ref 0.0–149.0)
VLDL: 20.6 mg/dL (ref 0.0–40.0)

## 2018-04-10 LAB — VITAMIN D 25 HYDROXY (VIT D DEFICIENCY, FRACTURES): VITD: 39.09 ng/mL (ref 30.00–100.00)

## 2018-04-10 LAB — HEMOGLOBIN A1C: Hgb A1c MFr Bld: 6 % (ref 4.6–6.5)

## 2018-04-10 LAB — VITAMIN B12: Vitamin B-12: 216 pg/mL (ref 211–911)

## 2018-04-10 NOTE — Progress Notes (Signed)
   Subjective:   Patient ID: Susan Esparza, female    DOB: 04-03-1956, 63 y.o.   MRN: [id number]  HPI The patient is a 63 YO female coming in for physical.   PMH, [place], social history reviewed and updated  Review of Systems  Constitutional: Negative.   HENT: Negative.   Eyes: Negative.   Respiratory: Negative for cough, chest tightness and shortness of breath.   Cardiovascular: Negative for chest pain, palpitations and leg swelling.  Gastrointestinal: Negative for abdominal distention, abdominal pain, constipation, diarrhea, nausea and vomiting.  Musculoskeletal: Negative.   Skin: Negative.   Neurological: Negative.   Psychiatric/Behavioral: Negative.     Objective:  Physical Exam Constitutional:      Appearance: She is well-developed.  HENT:     Head: Normocephalic and atraumatic.  Neck:     Musculoskeletal: Normal range of motion.  Cardiovascular:     Rate and Rhythm: Normal rate and regular rhythm.  Pulmonary:     Effort: Pulmonary effort is normal. No respiratory distress.     Breath sounds: Normal breath sounds. No wheezing or rales.  Abdominal:     General: Bowel sounds are normal. There is no distension.     Palpations: Abdomen is soft.     Tenderness: There is no abdominal tenderness. There is no rebound.  Skin:    General: Skin is warm and dry.  Neurological:     Mental Status: She is alert and oriented to person, place, and time.     Coordination: Coordination normal.  Psychiatric:        Mood and Affect: Mood normal.        Behavior: Behavior normal.     Vitals:   04/10/18 0755  BP: 118/82  Pulse: 64  Temp: 99.2 F (37.3 C)  TempSrc: Oral  SpO2: 99%  Weight: 205 lb (93 kg)  Height: 5' 4.75" (1.645 m)    Assessment & Plan:

## 2018-04-10 NOTE — Assessment & Plan Note (Signed)
Taking prozac and doing well. Does not want change today due to risk of recurrence of symptoms with change or stop.

## 2018-04-10 NOTE — Patient Instructions (Signed)

## 2018-04-10 NOTE — Assessment & Plan Note (Signed)
Flu shot up to date. Shingrix complete. Tetanus up to date. Colonoscopy due 2022. Mammogram up to date, pap smear up to date and dexa up to date. Counseled about sun safety and mole surveillance. Counseled about the dangers of distracted driving. Given 10 year screening recommendations.

## 2018-04-10 NOTE — Assessment & Plan Note (Signed)
Still using CPAP most nights. No problems.

## 2018-04-12 ENCOUNTER — Other Ambulatory Visit: Payer: Self-pay | Admitting: Internal Medicine

## 2018-04-12 DIAGNOSIS — M19071 Primary osteoarthritis, right ankle and foot: Secondary | ICD-10-CM

## 2018-04-12 DIAGNOSIS — M19041 Primary osteoarthritis, right hand: Secondary | ICD-10-CM

## 2018-04-12 DIAGNOSIS — M19042 Primary osteoarthritis, left hand: Secondary | ICD-10-CM

## 2018-04-12 DIAGNOSIS — M19072 Primary osteoarthritis, left ankle and foot: Secondary | ICD-10-CM

## 2018-04-18 ENCOUNTER — Encounter: Payer: Self-pay | Admitting: Internal Medicine

## 2018-04-18 DIAGNOSIS — M19071 Primary osteoarthritis, right ankle and foot: Secondary | ICD-10-CM

## 2018-04-18 DIAGNOSIS — M19041 Primary osteoarthritis, right hand: Secondary | ICD-10-CM

## 2018-04-18 DIAGNOSIS — M19042 Primary osteoarthritis, left hand: Secondary | ICD-10-CM

## 2018-04-18 DIAGNOSIS — M19072 Primary osteoarthritis, left ankle and foot: Secondary | ICD-10-CM

## 2018-04-18 MED ORDER — MELOXICAM 7.5 MG PO TABS: 7.5000 mg | ORAL_TABLET | Freq: Every day | ORAL | 3 refills | Status: DC

## 2018-06-22 ENCOUNTER — Encounter: Payer: Self-pay | Admitting: Internal Medicine

## 2018-08-02 ENCOUNTER — Encounter: Payer: Self-pay | Admitting: Internal Medicine

## 2018-08-03 ENCOUNTER — Encounter: Payer: Self-pay | Admitting: Family Medicine

## 2018-08-08 ENCOUNTER — Encounter: Payer: Self-pay | Admitting: Family Medicine

## 2018-08-08 ENCOUNTER — Ambulatory Visit (INDEPENDENT_AMBULATORY_CARE_PROVIDER_SITE_OTHER)
Admission: RE | Admit: 2018-08-08 | Discharge: 2018-08-08 | Disposition: A | Discharge status: Home / Self Care | Payer: Managed Care, Other (non HMO) | Source: Ambulatory Visit | Attending: Family Medicine | Admitting: Family Medicine

## 2018-08-08 ENCOUNTER — Ambulatory Visit (INDEPENDENT_AMBULATORY_CARE_PROVIDER_SITE_OTHER): Payer: Managed Care, Other (non HMO) | Admitting: Family Medicine

## 2018-08-08 ENCOUNTER — Other Ambulatory Visit: Payer: Self-pay

## 2018-08-08 VITALS — BP 122/88 | HR 64 | Ht 64.75 in | Wt 205.0 lb

## 2018-08-08 DIAGNOSIS — M7062 Trochanteric bursitis, left hip: Secondary | ICD-10-CM

## 2018-08-08 DIAGNOSIS — M25552 Pain in left hip: Secondary | ICD-10-CM

## 2018-08-08 NOTE — Assessment & Plan Note (Signed)
.    X-rays ordered today for ruling out based on patient's past medical history.  I believe the patient will do well.  Originally, we discussed the importance of weight loss.  Pression shortness at the moment.  Did respond well to the greater trochanteric injection hopefully will do well.  Follow-up again in 4 to 6 weeks

## 2018-08-08 NOTE — Assessment & Plan Note (Signed)
Injection.  Tolerated procedure well.  Discussed icing regimen and home exercise discussed anti-inflammatories.  Follow-up 4 to 6 weeks

## 2018-08-08 NOTE — Patient Instructions (Signed)
Good to see you  Xray downstairs Ice 20 minutes 2 times daily. Usually after activity and before bed. pennsaid pinkie amount topically 2 times daily as needed.  Thigh compression or compression shorts would be good.  Consider a bike for more cardio right now.  We injected the side of the hip  See me again in 3-4 weeks

## 2018-08-08 NOTE — Progress Notes (Signed)
[name] [place], [place] [address] Phone: [phone] Subjective:   [name], am serving as a scribe for Dr. Hulan Saas.   CC: Hip pain and back pain  ZJI:[id number]  Susan Esparza is a 63 y.o. female coming in with complaint of hip and back pain. Have not seen patient since 05/2017 for peroneal tendonitis pain. Patient states that has been having left sided groin pain for 2 weeks. Patient was seated and she got up and felt a sharp pain in her hip. Soreness turned into lower back pain. LBP subsided but now her pain radiates into the glute. Walking, stairs and getting in and out of car increases her pain. Pain decreases when lying on that side. Is using medications for rheumatoid arthritis and Tylenol for pain.       Past Medical History:  Diagnosis Date  . Allergic rhinitis   . Anxiety   . Arthritis   . Chronic dryness of both eyes   . Depression   . DVT (deep vein thrombosis) in pregnancy 2009   left leg after knee Arthroscopy  . Dysrhythmia    HX of "Bigemini"  . Numbness    rt hand  . Rheumatoid arthritis ([place]) 2018  . Sleep apnea    "borderline"  uses c-pap   Past Surgical History:  Procedure Laterality Date  . ENDOMETRIAL ABLATION  2006  . FACIAL COSMETIC SURGERY  2006  . KNEE ARTHROSCOPY Right 1989  . KNEE ARTHROSCOPY Left 2009  . MOUTH SURGERY  01/2018  . NASAL POLYP SURGERY  01/2008      . TOTAL KNEE ARTHROPLASTY Right 02/24/2015   Procedure: TOTAL RIGHT KNEE ARTHROPLASTY;  Surgeon: Gaynelle Arabian, MD;  Location: [facility];  Service: Orthopedics;  Laterality: Right;   Social History   Socioeconomic History  . Marital status: Married    Spouse name: Not on file  . Number of children: Not on file  . Years of education: Not on file  . Highest education level: Not on file  Occupational History  . Not on file  Social Needs  . Financial resource strain: Not on file  . Food insecurity:    Worry: Not on  file    Inability: Not on file  . Transportation needs:    Medical: Not on file    Non-medical: Not on file  Tobacco Use  . Smoking status: Never Smoker  . Smokeless tobacco: Never Used  Substance and Sexual Activity  . Alcohol use: Yes    Alcohol/week: 5.0 standard drinks    Types: 5 Standard drinks or equivalent per week    Comment: occasional  . Drug use: No  . Sexual activity: Yes    Partners: Male    Birth control/protection: Other-see comments    Comment: vasectomy  Lifestyle  . Physical activity:    Days per week: Not on file    Minutes per session: Not on file  . Stress: Not on file  Relationships  . Social connections:    Talks on phone: Not on file    Gets together: Not on file    Attends religious service: Not on file    Active member of club or organization: Not on file    Attends meetings of clubs or organizations: Not on file    Relationship status: Not on file  Other Topics Concern  . Not on file  Social History Narrative   ** Merged History Encounter **  Allergies  Allergen Reactions  . Fibrin Sealant Component Other (See Comments)    Did not work. Wound came apart.  . Penicillins Hives    Has patient had a PCN reaction causing immediate rash, facial/tongue/throat swelling, SOB or lightheadedness with hypotension: No Has patient had a PCN reaction causing severe rash involving mucus membranes or skin necrosis: No Has patient had a PCN reaction that required hospitalization No Has patient had a PCN reaction occurring within the last 10 years: No If all of the above answers are "NO", then may proceed with Cephalosporin use.   . Bupropion Hcl Itching and Rash   Family History  Problem Relation Age of Onset  . Hypertension Mother   . Osteoarthritis Mother   . Alzheimer's disease Mother   . Cancer Father        colon cancer  . Heart disease Father        tacycardia  . Diabetes Maternal Grandfather        Current Outpatient Medications  (Analgesics):  .  meloxicam (MOBIC) 7.5 MG tablet, Take 1-2 tablets (7.5-15 mg total) by mouth daily.   Current Outpatient Medications (Other):  Marland Kitchen  FLUoxetine (PROZAC) 20 MG capsule, TAKE 1 CAPSULE BY MOUTH EVERY DAY .  gabapentin (NEURONTIN) 100 MG capsule, TAKE 2 CAPSULES BY MOUTH AT BEDTIME. .  hydroxychloroquine (PLAQUENIL) 200 MG tablet, Take 200 mg by mouth 2 (two) times daily. .  methotrexate 50 MG/2ML injection, Inject 0.6 mg into the skin once a week. .  NEOMYCIN-POLYMYXIN-HYDROCORTISONE (CORTISPORIN) 1 % SOLN OTIC solution, Apply 1-2 drops to toe BID after soaking .  Vitamin D, Ergocalciferol, (DRISDOL) 50000 units CAPS capsule, TAKE ONE CAPSULE BY MOUTH TWICE A WEEK    Past medical history, social, surgical and family history all reviewed in electronic medical record.  No pertanent information unless stated regarding to the chief complaint.   Review of Systems:  No headache, visual changes, nausea, vomiting, diarrhea, constipation, dizziness, abdominal pain, skin rash, fevers, chills, night sweats, weight loss, swollen lymph nodes, body aches, joint swelling,  chest pain, shortness of breath, mood changes.  Positive muscle aches  Objective  Blood pressure 122/88, pulse 64, height 5' 4.75" (1.645 m), weight 205 lb (93 kg), last menstrual period 01/04/2008, SpO2 98 %.    General: No apparent distress alert and oriented x3 mood and affect normal, dressed appropriately.  HEENT: Pupils equal, extraocular movements intact  Respiratory: Patient's speak in full sentences and does not appear short of breath  Cardiovascular: No lower extremity edema, non tender, no erythema  Skin: Warm dry intact with no signs of infection or rash on extremities or on axial skeleton.  Abdomen: Soft nontender  Neuro: Cranial nerves II through XII are intact, neurovascularly intact in all extremities with 2+ DTRs and 2+ pulses.  Lymph: No lymphadenopathy of posterior or anterior cervical chain or axillae  bilaterally.  Gait antalgic gait MSK:  tender with full range of motion and good stability and symmetric strength and tone of shoulders, elbows, wrist,  knee and ankles bilaterally.   Left hip exam shows tenderness to palpation in the groin area.  No masses appreciated.  Patient does have pain with resisted flexion.  No pain with internal or external range of motion and patient has full range of motion.  Negative straight leg test.  Mild pain over the greater trochanteric area as well.  After verbal consent patient was prepped with alcohol swabs and with a 21-gauge 2 inch needle injected into the  left greater trochanteric area with a total of 2 cc of 0.5% Marcaine and 1 cc of Kenalog 40 mg/mL.  No blood loss.  Postinjection instructions given.  Band-Aid placed.    Impression and Recommendations:     This case required medical decision making of moderate complexity. The above documentation has been reviewed and is accurate and complete Lyndal Pulley, DO       Note: This dictation was prepared with Dragon dictation along with smaller phrase technology. Any transcriptional errors that result from this process are unintentional.

## 2018-08-09 ENCOUNTER — Encounter: Payer: Self-pay | Admitting: Obstetrics & Gynecology

## 2018-08-09 ENCOUNTER — Encounter: Payer: Self-pay | Admitting: Family Medicine

## 2018-08-15 ENCOUNTER — Telehealth: Payer: Self-pay

## 2018-08-15 NOTE — Telephone Encounter (Signed)
Spoke with patient. Appointment scheduled for 09/05/2018 at 3 pm with 3:30 pm consult with Dr.Miller. Patient verbalizes understanding.  Routing to provider and will close encounter.

## 2018-08-28 NOTE — Progress Notes (Signed)
[facility] [place], [place] [address] Phone: [phone] Subjective:   Susan Esparza, am serving as a scribe for Dr. Hulan Saas.   CC: Left hip pain  PPI:[id number]   08/08/2018: Injection.  Tolerated procedure well.  Discussed icing regimen and home exercise discussed anti-inflammatories.  Follow-up 4 to 6 weeks  Update 08/29/2018: Susan Esparza is a 63 y.o. female coming in with complaint of left hip pain.  Seen 3 weeks ago.  Was given an injection in the greater trochanteric area. Did have improvement with her pain from injection. Does have pain in the anterior portion of hip over hip flexor. Was doing hip flexor stretch and this increased her pain. Walking more than 10 steps increases her pain.        Past Medical History:  Diagnosis Date  . Allergic rhinitis   . Anxiety   . Arthritis   . Chronic dryness of both eyes   . Depression   . DVT (deep vein thrombosis) in pregnancy 2009   left leg after knee Arthroscopy  . Dysrhythmia    HX of "Bigemini"  . Numbness    rt hand  . Rheumatoid arthritis ([place]) 2018  . Sleep apnea    "borderline"  uses c-pap   Past Surgical History:  Procedure Laterality Date  . ENDOMETRIAL ABLATION  2006  . FACIAL COSMETIC SURGERY  2006  . KNEE ARTHROSCOPY Right 1989  . KNEE ARTHROSCOPY Left 2009  . MOUTH SURGERY  01/2018  . NASAL POLYP SURGERY  01/2008      . TOTAL KNEE ARTHROPLASTY Right 02/24/2015   Procedure: TOTAL RIGHT KNEE ARTHROPLASTY;  Surgeon: Gaynelle Arabian, MD;  Location: [facility];  Service: Orthopedics;  Laterality: Right;   Social History   Socioeconomic History  . Marital status: Married    Spouse name: Not on file  . Number of children: Not on file  . Years of education: Not on file  . Highest education level: Not on file  Occupational History  . Not on file  Social Needs  . Financial resource strain: Not on file  . Food insecurity:    Worry: Not on file    Inability:  Not on file  . Transportation needs:    Medical: Not on file    Non-medical: Not on file  Tobacco Use  . Smoking status: Never Smoker  . Smokeless tobacco: Never Used  Substance and Sexual Activity  . Alcohol use: Yes    Alcohol/week: 5.0 standard drinks    Types: 5 Standard drinks or equivalent per week    Comment: occasional  . Drug use: Esparza  . Sexual activity: Yes    Partners: Male    Birth control/protection: Other-see comments    Comment: vasectomy  Lifestyle  . Physical activity:    Days per week: Not on file    Minutes per session: Not on file  . Stress: Not on file  Relationships  . Social connections:    Talks on phone: Not on file    Gets together: Not on file    Attends religious service: Not on file    Active member of club or organization: Not on file    Attends meetings of clubs or organizations: Not on file    Relationship status: Not on file  Other Topics Concern  . Not on file  Social History Narrative   ** Merged History Encounter **       Allergies  Allergen  Reactions  . Fibrin Sealant Component Other (See Comments)    Did not work. Wound came apart.  . Penicillins Hives    Has patient had a PCN reaction causing immediate rash, facial/tongue/throat swelling, SOB or lightheadedness with hypotension: Esparza Has patient had a PCN reaction causing severe rash involving mucus membranes or skin necrosis: Esparza Has patient had a PCN reaction that required hospitalization Esparza Has patient had a PCN reaction occurring within the last 10 years: Esparza If all of the above answers are "Esparza", then may proceed with Cephalosporin use.   . Bupropion Hcl Itching and Rash   Family History  Problem Relation Age of Onset  . Hypertension Mother   . Osteoarthritis Mother   . Alzheimer's disease Mother   . Cancer Father        colon cancer  . Heart disease Father        tacycardia  . Diabetes Maternal Grandfather     Current Outpatient Medications (Endocrine & Metabolic):   .  predniSONE (DELTASONE) 50 MG tablet, Take 1 tablet (50 mg total) by mouth daily.    Current Outpatient Medications (Analgesics):  .  meloxicam (MOBIC) 7.5 MG tablet, Take 1-2 tablets (7.5-15 mg total) by mouth daily.   Current Outpatient Medications (Other):  Marland Kitchen  FLUoxetine (PROZAC) 20 MG capsule, TAKE 1 CAPSULE BY MOUTH EVERY DAY .  gabapentin (NEURONTIN) 100 MG capsule, TAKE 2 CAPSULES BY MOUTH AT BEDTIME. .  hydroxychloroquine (PLAQUENIL) 200 MG tablet, Take 200 mg by mouth 2 (two) times daily. .  methotrexate 50 MG/2ML injection, Inject 0.6 mg into the skin once a week. .  NEOMYCIN-POLYMYXIN-HYDROCORTISONE (CORTISPORIN) 1 % SOLN OTIC solution, Apply 1-2 drops to toe BID after soaking .  Vitamin D, Ergocalciferol, (DRISDOL) 50000 units CAPS capsule, TAKE ONE CAPSULE BY MOUTH TWICE A WEEK    Past medical history, social, surgical and family history all reviewed in electronic medical record.  Esparza pertanent information unless stated regarding to the chief complaint.   Review of Systems:  Esparza headache, visual changes, nausea, vomiting, diarrhea, constipation, dizziness, abdominal pain, skin rash, fevers, chills, night sweats, weight loss, swollen lymph nodes, body aches, joint swelling,  chest pain, shortness of breath, mood changes.  Positive muscle aches  Objective  Blood pressure 110/82, pulse 62, height 5' 4.75" (1.645 m), weight 205 lb (93 kg), last menstrual period 01/04/2008, SpO2 97 %.    General: Esparza apparent distress alert and oriented x3 mood and affect normal, dressed appropriately.  HEENT: Pupils equal, extraocular movements intact  Respiratory: Patient's speak in full sentences and does not appear short of breath  Cardiovascular: Esparza lower extremity edema, non tender, Esparza erythema  Skin: Warm dry intact with Esparza signs of infection or rash on extremities or on axial skeleton.  Abdomen: Soft nontender  Neuro: Cranial nerves II through XII are intact, neurovascularly intact in  all extremities with 2+ DTRs and 2+ pulses.  Lymph: Esparza lymphadenopathy of posterior or anterior cervical chain or axillae bilaterally.  Gait severely antalgic MSK:  Non tender with full range of motion and good stability and symmetric strength and tone of shoulders, elbows, wrist,  knee and ankles bilaterally.  Patient is favoring left hip significantly.  Worsening antalgic gait with every step.  Patient on exam has full range of motion when sitting.  Esparza pain with internal or external range of motion.  Pain over the lateral aspect of the hip significantly improved from previous exam.  Negative straight leg test.  Negative  fulcrum test    Impression and Recommendations:     This case required medical decision making of moderate complexity. The above documentation has been reviewed and is accurate and complete Lyndal Pulley, DO       Note: This dictation was prepared with Dragon dictation along with smaller phrase technology. Any transcriptional errors that result from this process are unintentional.

## 2018-08-29 ENCOUNTER — Encounter: Payer: Self-pay | Admitting: Family Medicine

## 2018-08-29 ENCOUNTER — Other Ambulatory Visit: Payer: Self-pay

## 2018-08-29 ENCOUNTER — Ambulatory Visit: Payer: Self-pay

## 2018-08-29 ENCOUNTER — Ambulatory Visit (INDEPENDENT_AMBULATORY_CARE_PROVIDER_SITE_OTHER): Payer: Managed Care, Other (non HMO) | Admitting: Family Medicine

## 2018-08-29 VITALS — BP 110/82 | HR 62 | Ht 64.75 in | Wt 205.0 lb

## 2018-08-29 DIAGNOSIS — M25552 Pain in left hip: Secondary | ICD-10-CM

## 2018-08-29 DIAGNOSIS — M2550 Pain in unspecified joint: Secondary | ICD-10-CM

## 2018-08-29 MED ORDER — PREDNISONE 50 MG PO TABS: 50.0000 mg | ORAL_TABLET | Freq: Every day | ORAL | 0 refills | Status: DC

## 2018-08-29 NOTE — Assessment & Plan Note (Signed)
Left hip pain seems to be worsening.  Patient is unable to bear weight.  Past medical history significant for rheumatoid arthritis and has had a insufficiency fracture previously.  I believe an MRI is necessary at this time to further evaluate with patient having a near x-ray.  Possible occult fracture could be contributing.  Possible acetabular stress fracture is within the differential.  Patient will have this done and we will consider which activities to doing which wants to avoid.  Follow-up again after imaging to discuss further.  Spent  25 minutes with patient face-to-face and had greater than 50% of counseling including as described above in assessment and plan.

## 2018-08-29 NOTE — Patient Instructions (Addendum)
Good to see you  Ice 20 minutes 2 times daily. Usually after activity and before bed. We will get MRI of the hip and they should call you soon. Maybe if not heard anything in a couple days call [phone] Stay active but lower impact will be better K2 over the counter daily  Prednisone daily for 5 days but social distance as far as you can.  We will talk after the imaging and discuss next steps

## 2018-08-30 ENCOUNTER — Telehealth: Payer: Self-pay | Admitting: Obstetrics & Gynecology

## 2018-08-30 NOTE — Telephone Encounter (Signed)
Call placed to patient to review information for scheduled ultrasound appointment on 09/05/2018. Will need to obtained new insurance information for pre-certification of appointment. Left voicemail message requesting a return call

## 2018-08-31 ENCOUNTER — Other Ambulatory Visit: Payer: Self-pay | Admitting: *Deleted

## 2018-08-31 ENCOUNTER — Encounter: Payer: Self-pay | Admitting: Family Medicine

## 2018-08-31 DIAGNOSIS — D259 Leiomyoma of uterus, unspecified: Secondary | ICD-10-CM

## 2018-08-31 NOTE — Telephone Encounter (Signed)
Patient returning call.

## 2018-09-01 ENCOUNTER — Other Ambulatory Visit: Payer: Self-pay

## 2018-09-01 ENCOUNTER — Encounter: Payer: Self-pay | Admitting: Family Medicine

## 2018-09-04 NOTE — Telephone Encounter (Signed)
Spoke with patient regarding benefits, with new insurance,  for scheduled ultrasound on 09/05/2018. Patient understood information presented and is agreeable. Will close encounter

## 2018-09-05 ENCOUNTER — Ambulatory Visit (INDEPENDENT_AMBULATORY_CARE_PROVIDER_SITE_OTHER): Payer: Managed Care, Other (non HMO)

## 2018-09-05 ENCOUNTER — Other Ambulatory Visit: Payer: Self-pay

## 2018-09-05 ENCOUNTER — Ambulatory Visit (INDEPENDENT_AMBULATORY_CARE_PROVIDER_SITE_OTHER): Payer: Managed Care, Other (non HMO) | Admitting: Obstetrics & Gynecology

## 2018-09-05 VITALS — BP 112/70 | HR 64 | Temp 97.7°F | Ht 64.75 in | Wt 208.0 lb

## 2018-09-05 DIAGNOSIS — D259 Leiomyoma of uterus, unspecified: Secondary | ICD-10-CM

## 2018-09-05 DIAGNOSIS — D219 Benign neoplasm of connective and other soft tissue, unspecified: Secondary | ICD-10-CM

## 2018-09-05 NOTE — Progress Notes (Signed)
63 y.o. [id number] Married White or Caucasian female here for pelvic ultrasound due to finding of calcified fibroid noted on x rays obtained 08/08/2018.  Consultation/follow up was recommended.  As she normal pelvic exams, PUS recommended.  She is here for this today.  Denies vaginal bleeding, pelvic pain or vaginal discharge.  Patient's last menstrual period was 01/04/2008.  Contraception: PMP  Findings:  UTERUS: 7.6 x 5.8 x 5.1cm with several fibroids measuring 2.0cm, 3.3cm, 1.8cm, 2.6cm and 2.9cm. Calcifications are noted EMS: 2.8mm, symmetric ADNEXA: Left ovary:  1.6 x 1.5 x 0.8cm       Right ovary:  2.3 x 1.3 x 2.7CW with 48mm follicle CUL DE SAC: no free fluid  Discussion:  Findings reviewed.  Benign nature of fibroids reviewed.  No treatment indicated as long as she has no bleeding and does not have any change with physical exam.  I do not feel repeat PUS is indicated at this time.  Questions answered.  Pt comfortable with plan.   Assessment:  Calcified intramural and subserosal fibroids  Plan:  Follow up for AEX and/or call with any new issues/concerns.  ~15 minutes spent with patient >50% of time was in face to face discussion of above.

## 2018-09-08 ENCOUNTER — Encounter: Payer: Self-pay | Admitting: Obstetrics & Gynecology

## 2018-09-08 DIAGNOSIS — D219 Benign neoplasm of connective and other soft tissue, unspecified: Secondary | ICD-10-CM | POA: Insufficient documentation

## 2018-09-11 ENCOUNTER — Other Ambulatory Visit: Payer: Self-pay | Admitting: *Deleted

## 2018-09-11 DIAGNOSIS — M25552 Pain in left hip: Secondary | ICD-10-CM

## 2018-09-15 ENCOUNTER — Other Ambulatory Visit: Payer: Self-pay | Admitting: Obstetrics & Gynecology

## 2018-09-15 NOTE — Telephone Encounter (Signed)
Medication refill request: vitamin d  Last AEX:  08-16-17 SM  Next AEX: 11-21-2018  Last MMG (if hormonal medication request): n/a Refill authorized: today, please advise.   Medication pended for #12,0RF. Please refill if appropriate.

## 2018-09-17 ENCOUNTER — Other Ambulatory Visit: Payer: Self-pay | Admitting: Family Medicine

## 2018-09-19 ENCOUNTER — Telehealth: Payer: Self-pay | Admitting: Internal Medicine

## 2018-09-19 NOTE — Telephone Encounter (Signed)
error 

## 2018-09-19 NOTE — Telephone Encounter (Signed)
Updated order w/ authorization faxed.

## 2018-09-19 NOTE — Telephone Encounter (Signed)
Copied from [place] [id number]. Topic: General - Inquiry >> Sep 19, 2018  9:25 AM Margot Ables wrote: Reason for CRM: Angie called stating she needs signed order sent over for MR HIP LEFT W CONTRAST andDG FLUORO GUIDED NEEDLE PLC ASPIRATION/INJECTION LOC. The pt is scheduled for 6/17 10:30am. Ph# [phone] Fax# [phone]

## 2018-09-23 NOTE — Progress Notes (Signed)
Virtual Visit via Video Note  I connected with Susan Esparza on 09/23/18 at 12:30 PM EDT by a video enabled telemedicine application and verified that I am speaking with the correct person using two identifiers.  Location: Patient: home setting only patient Provider: office setting   I discussed the limitations of evaluation and management by telemedicine and the availability of in person appointments. The patient expressed understanding and agreed to proceed.  History of Present Illness: Patient is a 63 year old female coming in for follow-up of her hip pain.  Was sent for an MRI secondary to the severity of the pain.  MRI was independently visualized by me showing no significant bony abnormality.  Some mild degenerative labral tearing but nothing that is severe.  Patient has some mild chronic gluteal tendinitis but otherwise fairly unremarkable except for some lumbosacral degenerative disc disease patient states unfortunately continues to have discomfort and pain.  Has difficulty even walking greater than 200 to 300 feet without discomfort.  States that there is some associated back pain with it.  Some radiation now seems to be going past the knee as well.    Observations/Objective: Patient is comfortable sitting with her husband.  Asking good questions.  Alert and oriented x3   Assessment and Plan: Back pain.  MRI of the back does show degenerative disc disease especially at L4-L5 that could be consistent with some of patient's symptoms.  Patient will try an epidural.  This was ordered today.  Unfortunately otherwise we continue to have difficulty figuring out why patient is having pain.  This seems to fail I think an EMG could be done next.  Patient is in agreement with the plan.   Follow Up Instructions: Follow-up 2 weeks after epidural    I discussed the assessment and treatment plan with the patient. The patient was provided an opportunity to ask questions and all were answered. The  patient agreed with the plan and demonstrated an understanding of the instructions.   The patient was advised to call back or seek an in-person evaluation if the symptoms worsen or if the condition fails to improve as anticipated.  I provided 26 minutes of non-face-to-face time during this encounter.   Lyndal Pulley, DO

## 2018-09-25 ENCOUNTER — Encounter: Payer: Self-pay | Admitting: Family Medicine

## 2018-09-25 ENCOUNTER — Ambulatory Visit (INDEPENDENT_AMBULATORY_CARE_PROVIDER_SITE_OTHER): Payer: Managed Care, Other (non HMO) | Admitting: Family Medicine

## 2018-09-25 DIAGNOSIS — M25552 Pain in left hip: Secondary | ICD-10-CM | POA: Diagnosis not present

## 2018-09-25 DIAGNOSIS — M5136 Other intervertebral disc degeneration, lumbar region: Secondary | ICD-10-CM | POA: Insufficient documentation

## 2018-09-25 DIAGNOSIS — M5416 Radiculopathy, lumbar region: Secondary | ICD-10-CM | POA: Diagnosis not present

## 2018-09-25 NOTE — Assessment & Plan Note (Signed)
Degenerative disc disease.  We discussed posture and ergonomics.  Discussed which activities to do which was to avoid.  Discussed home exercise.  Follow-up again in 4 to 8 weeks

## 2018-09-25 NOTE — Assessment & Plan Note (Signed)
Patient's MRI showed some mild chronic changes the labrum but no acute tear.  Does not seem to be associated with the amount of pain that she is having at this point.  I am concerned more that this is a lumbar sacral degenerative disc disease causing a nerve impingement.  I believe an L4-L5 nerve injection could be beneficial with an epidural.  We will order that today.  Discussed icing regimen and home exercises.  Discussed which activities to do which wants to avoid.  Patient will follow-up with me again 4 to 8 weeks

## 2018-09-26 ENCOUNTER — Encounter: Payer: Self-pay | Admitting: Family Medicine

## 2018-09-26 ENCOUNTER — Other Ambulatory Visit: Payer: Self-pay

## 2018-09-26 DIAGNOSIS — M5416 Radiculopathy, lumbar region: Secondary | ICD-10-CM

## 2018-10-02 ENCOUNTER — Encounter: Payer: Self-pay | Admitting: Obstetrics & Gynecology

## 2018-10-02 ENCOUNTER — Other Ambulatory Visit: Payer: Managed Care, Other (non HMO)

## 2018-10-03 ENCOUNTER — Encounter: Payer: Self-pay | Admitting: Obstetrics & Gynecology

## 2018-10-09 ENCOUNTER — Encounter: Payer: Self-pay | Admitting: Family Medicine

## 2018-10-24 ENCOUNTER — Other Ambulatory Visit: Payer: Self-pay | Admitting: Obstetrics & Gynecology

## 2018-10-28 ENCOUNTER — Ambulatory Visit
Admission: RE | Admit: 2018-10-28 | Discharge: 2018-10-28 | Disposition: A | Discharge status: Home / Self Care | Payer: Managed Care, Other (non HMO) | Source: Ambulatory Visit | Attending: Family Medicine | Admitting: Family Medicine

## 2018-10-28 ENCOUNTER — Other Ambulatory Visit: Payer: Self-pay

## 2018-10-28 DIAGNOSIS — M5416 Radiculopathy, lumbar region: Secondary | ICD-10-CM

## 2018-10-31 ENCOUNTER — Encounter: Payer: Self-pay | Admitting: Family Medicine

## 2018-11-01 ENCOUNTER — Other Ambulatory Visit: Payer: Self-pay

## 2018-11-01 MED ORDER — VITAMIN D (ERGOCALCIFEROL) 1.25 MG (50000 UNIT) PO CAPS: ORAL_CAPSULE | ORAL | 0 refills | Status: DC

## 2018-11-01 NOTE — Telephone Encounter (Signed)
Medication refill request: Vitamin D  Last AEX:  08/16/17 Next AEX: 12/01/18 Last MMG (if hormonal medication request): 03/08/18 Bi-rads 1 neg  Refill authorized: #12 with 0 RF

## 2018-11-08 ENCOUNTER — Other Ambulatory Visit: Payer: Self-pay | Admitting: Family Medicine

## 2018-11-08 ENCOUNTER — Ambulatory Visit
Admission: RE | Admit: 2018-11-08 | Discharge: 2018-11-08 | Disposition: A | Discharge status: Home / Self Care | Payer: Managed Care, Other (non HMO) | Source: Ambulatory Visit | Attending: Family Medicine | Admitting: Family Medicine

## 2018-11-08 DIAGNOSIS — M5416 Radiculopathy, lumbar region: Secondary | ICD-10-CM

## 2018-11-08 MED ORDER — IOPAMIDOL (ISOVUE-M 200) INJECTION 41%
1.0000 mL | Freq: Once | INTRAMUSCULAR | Status: AC
  Administered 2018-11-08: 1 mL via EPIDURAL

## 2018-11-08 MED ORDER — METHYLPREDNISOLONE ACETATE 40 MG/ML INJ SUSP (RADIOLOG
120.0000 mg | Freq: Once | INTRAMUSCULAR | Status: AC
  Administered 2018-11-08: 120 mg via EPIDURAL

## 2018-11-08 NOTE — Discharge Instructions (Signed)

## 2018-11-21 ENCOUNTER — Ambulatory Visit: Payer: PRIVATE HEALTH INSURANCE | Admitting: Obstetrics & Gynecology

## 2018-11-21 ENCOUNTER — Encounter

## 2018-11-29 ENCOUNTER — Telehealth: Payer: Self-pay | Admitting: Obstetrics & Gynecology

## 2018-11-29 ENCOUNTER — Encounter: Payer: Self-pay | Admitting: Obstetrics & Gynecology

## 2018-11-29 NOTE — Telephone Encounter (Signed)
Patient sent the following correspondence through [facility].  When I come on Friday for yearly exam, can I get a flu shot? Thanks  [facility]

## 2018-11-29 NOTE — Telephone Encounter (Signed)
Call to patient. RN advised patient our office had not yet received flu shots. Verbalized understanding. Patient will follow up with PCP.   Will close encounter.

## 2018-11-30 ENCOUNTER — Encounter: Payer: Self-pay | Admitting: Family Medicine

## 2018-11-30 ENCOUNTER — Encounter: Payer: Self-pay | Admitting: *Deleted

## 2018-11-30 ENCOUNTER — Other Ambulatory Visit: Payer: Self-pay

## 2018-11-30 DIAGNOSIS — M5136 Other intervertebral disc degeneration, lumbar region: Secondary | ICD-10-CM

## 2018-12-01 ENCOUNTER — Other Ambulatory Visit: Payer: Self-pay

## 2018-12-01 ENCOUNTER — Ambulatory Visit (INDEPENDENT_AMBULATORY_CARE_PROVIDER_SITE_OTHER): Payer: Managed Care, Other (non HMO) | Admitting: Obstetrics & Gynecology

## 2018-12-01 ENCOUNTER — Encounter: Payer: Self-pay | Admitting: Obstetrics & Gynecology

## 2018-12-01 VITALS — BP 110/76 | HR 60 | Temp 97.1°F | Ht 64.5 in | Wt 207.0 lb

## 2018-12-01 DIAGNOSIS — Z01419 Encounter for gynecological examination (general) (routine) without abnormal findings: Secondary | ICD-10-CM

## 2018-12-01 MED ORDER — VITAMIN D (ERGOCALCIFEROL) 1.25 MG (50000 UNIT) PO CAPS: ORAL_CAPSULE | ORAL | 3 refills | Status: DC

## 2018-12-01 NOTE — Progress Notes (Signed)
63 y.o. [id number] Married White or Caucasian female here for annual exam.  Doing well except for having some back issues.  Seeing Hulan Saas.  Had injection in her back and this has helped.    Denies vaginal bleeding.    Husband has been diagnosed with prostate cancer.  He is being followed for this.  He is being followed by [facility].  H/O RA.  Followed by Leafy Kindle at [facility].    PCP:  Dr. Sharlet Salina  Patient's last menstrual period was 01/04/2008.          Sexually active: Yes.    The current method of family planning is post menopausal status.    Exercising: Yes.    walking Smoker:  no  Health Maintenance: Pap:  05/18/16 neg. HR HPV:neg   12/03/13 Neg  History of abnormal Pap:  no MMG:  03/08/18 BIRADS1:neg  Colonoscopy:  03/10/16 f/u 5 years  BMD:  Plan to do with MMG TDaP:  2016 Pneumonia vaccine(s):  Done  Shingrix:   Completed  Hep C testing: 01/28/15 neg  Screening Labs: PCP   reports that she has never smoked. She has never used smokeless tobacco. She reports current alcohol use of about 7.0 standard drinks of alcohol per week. She reports that she does not use drugs.  Past Medical History:  Diagnosis Date  . Allergic rhinitis   . Anxiety   . Arthritis   . Chronic dryness of both eyes   . Depression   . DVT (deep vein thrombosis) in pregnancy 2009   left leg after knee Arthroscopy  . Dysrhythmia    HX of "[place]"  . Numbness    rt hand  . Rheumatoid arthritis ([place]) 2018  . Sleep apnea    "borderline"  uses c-pap    Past Surgical History:  Procedure Laterality Date  . ENDOMETRIAL ABLATION  2006  . FACIAL COSMETIC SURGERY  2006  . KNEE ARTHROSCOPY Right 1989  . KNEE ARTHROSCOPY Left 2009  . MOUTH SURGERY  01/2018  . NASAL POLYP SURGERY  01/2008      . TOTAL KNEE ARTHROPLASTY Right 02/24/2015   Procedure: TOTAL RIGHT KNEE ARTHROPLASTY;  Surgeon: Gaynelle Arabian, MD;  Location: [facility];  Service: Orthopedics;  Laterality: Right;     Current Outpatient Medications  Medication Sig Dispense Refill  . FLUoxetine (PROZAC) 20 MG capsule TAKE 1 CAPSULE BY MOUTH EVERY DAY 90 capsule 3  . gabapentin (NEURONTIN) 100 MG capsule TAKE 2 CAPSULES BY MOUTH AT BEDTIME. 180 capsule 2  . hydroxychloroquine (PLAQUENIL) 200 MG tablet Take 200 mg by mouth 2 (two) times daily.  0  . meloxicam (MOBIC) 7.5 MG tablet Take 1-2 tablets (7.5-15 mg total) by mouth daily. 180 tablet 3  . Vitamin D, Ergocalciferol, (DRISDOL) 1.25 MG (50000 UT) CAPS capsule 1 capsule po twice weekly. 24 capsule 0   No current facility-administered medications for this visit.     Family History  Problem Relation Age of Onset  . Hypertension Mother   . Osteoarthritis Mother   . Alzheimer's disease Mother   . Cancer Father        colon cancer  . Heart disease Father        tacycardia  . Diabetes Maternal Grandfather     Review of Systems  All other systems reviewed and are negative.   Exam:   BP 110/76   Pulse 60   Temp (!) 97.1 F (36.2 C) (Temporal)   Ht 5' 4.5" (1.638  m)   Wt 207 lb (93.9 kg)   LMP 01/04/2008   BMI 34.98 kg/m    Height: 5' 4.5" (163.8 cm)  Ht Readings from Last 3 Encounters:  12/01/18 5' 4.5" (1.638 m)  09/05/18 5' 4.75" (1.645 m)  08/29/18 5' 4.75" (1.645 m)    General appearance: alert, cooperative and appears stated age Head: Normocephalic, without obvious abnormality, atraumatic Neck: no adenopathy, supple, symmetrical, trachea midline and thyroid normal to inspection and palpation Lungs: clear to auscultation bilaterally Breasts: normal appearance, no masses or tenderness Heart: regular rate and rhythm Abdomen: soft, non-tender; bowel sounds normal; no masses,  no organomegaly Extremities: extremities normal, atraumatic, no cyanosis or edema Skin: Skin color, texture, turgor normal. No rashes or lesions Lymph nodes: Cervical, supraclavicular, and axillary nodes normal. No abnormal inguinal nodes  palpated Neurologic: Grossly normal   Pelvic: External genitalia:  no lesions              Urethra:  normal appearing urethra with no masses, tenderness or lesions              Bartholins and Skenes: normal                 Vagina: normal appearing vagina with normal color and discharge, no lesions              Cervix: no lesions              Pap taken: No. Bimanual Exam:  Uterus:  normal size, contour, position, consistency, mobility, non-tender              Adnexa: normal adnexa and no mass, fullness, tenderness               Rectovaginal: Confirms               Anus:  normal sphincter tone, no lesions  Chaperone was present for exam.  A:  Well Woman with normal exam PMP, no HRT H/o colonic polyps Vit D deficiency  RA, followed by Dr. Trudie Reed H/O DVT after knee surgery Back pain  P:   Mammogram guidelines reviewed. Plan BMD with MMG.  Order will be faxed to [facility]. pap smear with neg 2018.  Not indicated today. RF for Vit D 50K twice weekly.  #24/4RF Vaccines are UTD Return annually or prn

## 2018-12-05 ENCOUNTER — Other Ambulatory Visit: Payer: Managed Care, Other (non HMO)

## 2018-12-18 ENCOUNTER — Ambulatory Visit: Payer: Managed Care, Other (non HMO) | Admitting: Family Medicine

## 2019-02-05 ENCOUNTER — Ambulatory Visit: Payer: Managed Care, Other (non HMO)

## 2019-02-05 ENCOUNTER — Other Ambulatory Visit: Payer: Self-pay

## 2019-02-05 ENCOUNTER — Other Ambulatory Visit: Payer: Self-pay | Admitting: Internal Medicine

## 2019-02-05 ENCOUNTER — Encounter: Payer: Self-pay | Admitting: Internal Medicine

## 2019-02-05 ENCOUNTER — Ambulatory Visit (INDEPENDENT_AMBULATORY_CARE_PROVIDER_SITE_OTHER): Payer: Managed Care, Other (non HMO) | Admitting: Internal Medicine

## 2019-02-05 VITALS — BP 130/80 | HR 62 | Temp 98.3°F | Ht 64.5 in | Wt 210.0 lb

## 2019-02-05 DIAGNOSIS — M069 Rheumatoid arthritis, unspecified: Secondary | ICD-10-CM

## 2019-02-05 DIAGNOSIS — Z23 Encounter for immunization: Secondary | ICD-10-CM | POA: Diagnosis not present

## 2019-02-05 NOTE — Progress Notes (Signed)
   Subjective:   Patient ID: Susan Esparza, female    DOB: 10-Jul-1955, 63 y.o.   MRN: [id number]  HPI The patient is a 63 YO female coming in for concerns about her RA. She has been seeing rheumatology in town and wants to change to a different provider. She has talked to a friend who sees this provider. She is feeling like she is being put on too many medicines and wants to explore diet as an option to help her RA. It is in the hand and feet more lately. Denies bad flare. Taking her plaquenil currently and up to date on eye exam.   Review of Systems  Constitutional: Negative.   HENT: Negative.   Eyes: Negative.   Respiratory: Negative for cough, chest tightness and shortness of breath.   Cardiovascular: Negative for chest pain, palpitations and leg swelling.  Gastrointestinal: Negative for abdominal distention, abdominal pain, constipation, diarrhea, nausea and vomiting.  Musculoskeletal: Positive for arthralgias and joint swelling.  Skin: Negative.   Neurological: Negative.   Psychiatric/Behavioral: Negative.     Objective:  Physical Exam Constitutional:      Appearance: She is well-developed.  HENT:     Head: Normocephalic and atraumatic.  Neck:     Musculoskeletal: Normal range of motion.  Cardiovascular:     Rate and Rhythm: Normal rate and regular rhythm.  Pulmonary:     Effort: Pulmonary effort is normal. No respiratory distress.     Breath sounds: Normal breath sounds. No wheezing or rales.  Abdominal:     General: Bowel sounds are normal. There is no distension.     Palpations: Abdomen is soft.     Tenderness: There is no abdominal tenderness. There is no rebound.  Musculoskeletal:        General: Tenderness present.     Comments: Right 2nd MCP with redness and mild swelling  Skin:    General: Skin is warm and dry.  Neurological:     Mental Status: She is alert and oriented to person, place, and time.     Coordination: Coordination normal.     Vitals:   02/05/19 1510  BP: 130/80  Pulse: 62  Temp: 98.3 F (36.8 C)  TempSrc: Oral  SpO2: 98%  Weight: 210 lb (95.3 kg)  Height: 5' 4.5" (1.638 m)    Assessment & Plan:  Flu shot given at visit

## 2019-02-05 NOTE — Assessment & Plan Note (Signed)
Taking plaquenil and referral done to rheumatology. She is also taking meloxicam and overall not satisfied with level of control. Wants to explore other options for treatment.

## 2019-02-05 NOTE — Patient Instructions (Signed)
We will get you in with the rheumatologist at [facility] to see them.

## 2019-02-15 ENCOUNTER — Other Ambulatory Visit: Payer: Self-pay

## 2019-02-15 ENCOUNTER — Ambulatory Visit
Admission: RE | Admit: 2019-02-15 | Discharge: 2019-02-15 | Disposition: A | Discharge status: Home / Self Care | Payer: Managed Care, Other (non HMO) | Source: Ambulatory Visit | Attending: Family Medicine | Admitting: Family Medicine

## 2019-02-15 DIAGNOSIS — M5136 Other intervertebral disc degeneration, lumbar region: Secondary | ICD-10-CM

## 2019-02-15 MED ORDER — IOPAMIDOL (ISOVUE-M 200) INJECTION 41%
1.0000 mL | Freq: Once | INTRAMUSCULAR | Status: AC
  Administered 2019-02-15: 1 mL via EPIDURAL

## 2019-02-15 MED ORDER — METHYLPREDNISOLONE ACETATE 40 MG/ML INJ SUSP (RADIOLOG
120.0000 mg | Freq: Once | INTRAMUSCULAR | Status: AC
  Administered 2019-02-15: 120 mg via EPIDURAL

## 2019-02-15 NOTE — Discharge Instructions (Signed)

## 2019-03-14 LAB — HM DEXA SCAN: HM Dexa Scan: -1.2

## 2019-03-14 LAB — HM MAMMOGRAPHY

## 2019-03-16 ENCOUNTER — Other Ambulatory Visit: Payer: Self-pay | Admitting: Internal Medicine

## 2019-03-16 DIAGNOSIS — M19041 Primary osteoarthritis, right hand: Secondary | ICD-10-CM

## 2019-03-16 DIAGNOSIS — M19071 Primary osteoarthritis, right ankle and foot: Secondary | ICD-10-CM

## 2019-03-16 DIAGNOSIS — M19072 Primary osteoarthritis, left ankle and foot: Secondary | ICD-10-CM

## 2019-03-16 DIAGNOSIS — M19042 Primary osteoarthritis, left hand: Secondary | ICD-10-CM

## 2019-03-21 ENCOUNTER — Encounter: Payer: Self-pay | Admitting: Internal Medicine

## 2019-03-21 NOTE — Progress Notes (Signed)
Abstracted and sent to scan  

## 2019-03-28 ENCOUNTER — Telehealth: Payer: Self-pay

## 2019-03-28 ENCOUNTER — Encounter: Payer: Self-pay | Admitting: Internal Medicine

## 2019-03-28 NOTE — Telephone Encounter (Signed)
Bone Density results were discussed in detail with patient. Patient agreeable to plan of care. Closing encounter.

## 2019-04-12 ENCOUNTER — Ambulatory Visit: Payer: Managed Care, Other (non HMO) | Attending: Internal Medicine

## 2019-04-12 DIAGNOSIS — Z20822 Contact with and (suspected) exposure to covid-19: Secondary | ICD-10-CM

## 2019-04-13 ENCOUNTER — Other Ambulatory Visit: Payer: Managed Care, Other (non HMO)

## 2019-04-14 LAB — NOVEL CORONAVIRUS, NAA: SARS-CoV-2, NAA: NOT DETECTED

## 2019-05-02 ENCOUNTER — Other Ambulatory Visit: Payer: Self-pay | Admitting: Family Medicine

## 2019-06-04 ENCOUNTER — Encounter: Payer: Self-pay | Admitting: Internal Medicine

## 2019-07-16 ENCOUNTER — Encounter: Payer: Self-pay | Admitting: Internal Medicine

## 2019-07-16 ENCOUNTER — Other Ambulatory Visit: Payer: Self-pay

## 2019-07-16 ENCOUNTER — Ambulatory Visit (INDEPENDENT_AMBULATORY_CARE_PROVIDER_SITE_OTHER): Payer: Managed Care, Other (non HMO) | Admitting: Internal Medicine

## 2019-07-16 VITALS — BP 138/82 | HR 64 | Temp 98.1°F | Ht 64.5 in | Wt 213.8 lb

## 2019-07-16 DIAGNOSIS — F5104 Psychophysiologic insomnia: Secondary | ICD-10-CM | POA: Diagnosis not present

## 2019-07-16 DIAGNOSIS — F988 Other specified behavioral and emotional disorders with onset usually occurring in childhood and adolescence: Secondary | ICD-10-CM

## 2019-07-16 DIAGNOSIS — F331 Major depressive disorder, recurrent, moderate: Secondary | ICD-10-CM

## 2019-07-16 MED ORDER — ESCITALOPRAM OXALATE 10 MG PO TABS: 10.0000 mg | ORAL_TABLET | Freq: Every day | ORAL | 1 refills | Status: DC

## 2019-07-16 MED ORDER — LISDEXAMFETAMINE DIMESYLATE 10 MG PO CAPS: 10.0000 mg | ORAL_CAPSULE | Freq: Every day | ORAL | 0 refills | Status: DC

## 2019-07-16 NOTE — Patient Instructions (Addendum)
Strattera is the alternative for the concentration.   We have sent in lexapro to switch the prozac. You do not have to wean off prozac and can take prozac one day and lexapro the next day.   We have sent in the vyvanse to try for concentration if the lexapro does not help.

## 2019-07-16 NOTE — Progress Notes (Signed)
   Subjective:   Patient ID: Susan Esparza, female    DOB: 1955-07-27, 64 y.o.   MRN: [id number]  HPI The patient is a 64 YO female coming in for several concerns including brain fog (previously was on vyvanse and this did cause worsening anxiety at 30 mg dosing, last taken about 5 years ago, feels that she needs this temprarily due to struggles with concentration and in accomplishing tasks, also having sleep problems) and depression/anxiety (going through a lot of stress at this time, is struggling with adjusting to that, is thinking a lot about this all the time, not motivated to do things, not sleeping well at night, denies SI/HI, is open to taking something for this, taking prozac currently for many years and just feels like it is not working well in recent times) and sleeping problems (cannot sleep more than a couple hours per night for several weeks now at least, she is noticing very tired throughout the day and does not nap, is thinking about things a lot and cannot fall asleep, has tried otc things which have not helped).   Review of Systems  Constitutional: Positive for activity change and appetite change.  HENT: Negative.   Eyes: Negative.   Respiratory: Negative for cough, chest tightness and shortness of breath.   Cardiovascular: Negative for chest pain, palpitations and leg swelling.  Gastrointestinal: Negative for abdominal distention, abdominal pain, constipation, diarrhea, nausea and vomiting.  Musculoskeletal: Negative.   Skin: Negative.   Neurological: Negative.   Psychiatric/Behavioral: Positive for decreased concentration, dysphoric mood and sleep disturbance. Negative for agitation, behavioral problems and confusion. The patient is nervous/anxious.     Objective:  Physical Exam Constitutional:      Appearance: She is well-developed.  HENT:     Head: Normocephalic and atraumatic.  Cardiovascular:     Rate and Rhythm: Normal rate and regular rhythm.  Pulmonary:   Effort: Pulmonary effort is normal. No respiratory distress.     Breath sounds: Normal breath sounds. No wheezing or rales.  Abdominal:     General: Bowel sounds are normal. There is no distension.     Palpations: Abdomen is soft.     Tenderness: There is no abdominal tenderness. There is no rebound.  Musculoskeletal:     Cervical back: Normal range of motion.  Skin:    General: Skin is warm and dry.  Neurological:     Mental Status: She is alert and oriented to person, place, and time.     Coordination: Coordination normal.  Psychiatric:     Comments: Mood appropriate     Vitals:   07/16/19 1548  BP: 138/82  Pulse: 64  Temp: 98.1 F (36.7 C)  SpO2: 98%  Weight: 213 lb 12.8 oz (97 kg)  Height: 5' 4.5" (1.638 m)    This visit occurred during the SARS-CoV-2 public health emergency.  Safety protocols were in place, including screening questions prior to the visit, additional usage of staff PPE, and extensive cleaning of exam room while observing appropriate contact time as indicated for disinfecting solutions.   Assessment & Plan:

## 2019-07-19 DIAGNOSIS — G4700 Insomnia, unspecified: Secondary | ICD-10-CM | POA: Insufficient documentation

## 2019-07-19 DIAGNOSIS — F331 Major depressive disorder, recurrent, moderate: Secondary | ICD-10-CM | POA: Insufficient documentation

## 2019-07-19 NOTE — Assessment & Plan Note (Signed)
Likely related to anxiety and depression with mind not clearing at night. Advised to try otc treatments and which ones are safe to try. We will see how lexapro does for the underlying mood disorder and then adjust treatment for sleep specifically if needed.

## 2019-07-19 NOTE — Assessment & Plan Note (Signed)
Change prozac to lexapro 10 mg daily. Will give 1 month for adjustment and then change if needed.

## 2019-07-19 NOTE — Assessment & Plan Note (Signed)
Will resume vyvanse at lower dosing 10 mg daily and advised that likely the depression/anxiety and sleep problems are the cause of the concentration problems and this will likely be temporary.

## 2019-07-20 ENCOUNTER — Telehealth: Payer: Self-pay

## 2019-07-20 NOTE — Telephone Encounter (Signed)
Called pharmacy and informed them that Vyvanse has been approved from 07/19/2019 to 07/30/2022. Pharmacy will notify patient

## 2019-07-20 NOTE — Telephone Encounter (Signed)
Prior Auth for Vyvanse 10mg  was completed and approved from 07-19-2019 to 07/19/2022.  Pharmacy will be contacted with the updated information today.  Key: [id number]

## 2019-08-03 ENCOUNTER — Other Ambulatory Visit: Payer: Self-pay | Admitting: Internal Medicine

## 2019-08-16 ENCOUNTER — Encounter: Payer: Self-pay | Admitting: Internal Medicine

## 2019-08-23 ENCOUNTER — Other Ambulatory Visit: Payer: Self-pay

## 2019-08-23 ENCOUNTER — Encounter: Payer: Self-pay | Admitting: Internal Medicine

## 2019-08-23 ENCOUNTER — Ambulatory Visit (INDEPENDENT_AMBULATORY_CARE_PROVIDER_SITE_OTHER): Payer: No Typology Code available for payment source | Admitting: Internal Medicine

## 2019-08-23 VITALS — BP 142/80 | HR 58 | Temp 98.5°F | Ht 64.5 in | Wt 212.0 lb

## 2019-08-23 DIAGNOSIS — F331 Major depressive disorder, recurrent, moderate: Secondary | ICD-10-CM

## 2019-08-23 DIAGNOSIS — F988 Other specified behavioral and emotional disorders with onset usually occurring in childhood and adolescence: Secondary | ICD-10-CM | POA: Diagnosis not present

## 2019-08-23 DIAGNOSIS — F5104 Psychophysiologic insomnia: Secondary | ICD-10-CM | POA: Diagnosis not present

## 2019-08-23 MED ORDER — LISDEXAMFETAMINE DIMESYLATE 20 MG PO CAPS: 20.0000 mg | ORAL_CAPSULE | Freq: Every day | ORAL | 0 refills | Status: DC

## 2019-08-23 NOTE — Progress Notes (Signed)
   Subjective:   Patient ID: Susan Esparza, female    DOB: [birth year], 64 y.o.   MRN: [id number]  HPI The patient is a 64 YO female coming in for follow up mood (switched from prozac to lexapro and did well with the transition, she is feeling like this is working better overall, denies side effects, overall satisfied with mental state for now) and ADD (tried vyvanse 10 mg daily and did well without side effects, did feel attention was not fully better so tried 20 mg daily a couple days and felt more benefit, would like to try this if possible, denies loss of weight or appetite or palpitations) and sleep (overall some improved but still having some sleepless nights, uses otc medications for sleep if sleeping poorly several nights in a row, overall sleepiness is down some).   Review of Systems  Constitutional: Negative.   HENT: Negative.   Eyes: Negative.   Respiratory: Negative for cough, chest tightness and shortness of breath.   Cardiovascular: Negative for chest pain, palpitations and leg swelling.  Gastrointestinal: Negative for abdominal distention, abdominal pain, constipation, diarrhea, nausea and vomiting.  Musculoskeletal: Negative.   Skin: Negative.   Neurological: Negative.   Psychiatric/Behavioral: Positive for decreased concentration and sleep disturbance.    Objective:  Physical Exam Constitutional:      Appearance: She is well-developed.  HENT:     Head: Normocephalic and atraumatic.  Cardiovascular:     Rate and Rhythm: Normal rate and regular rhythm.  Pulmonary:     Effort: Pulmonary effort is normal. No respiratory distress.     Breath sounds: Normal breath sounds. No wheezing or rales.  Abdominal:     General: Bowel sounds are normal. There is no distension.     Palpations: Abdomen is soft.     Tenderness: There is no abdominal tenderness. There is no rebound.  Musculoskeletal:     Cervical back: Normal range of motion.  Skin:    General: Skin is warm and dry.   Neurological:     Mental Status: She is alert and oriented to person, place, and time.     Coordination: Coordination normal.     Vitals:   08/23/19 1529  BP: (!) 142/80  Pulse: (!) 58  Temp: 98.5 F (36.9 C)  SpO2: 99%  Weight: 212 lb (96.2 kg)  Height: 5' 4.5" (1.638 m)    This visit occurred during the SARS-CoV-2 public health emergency.  Safety protocols were in place, including screening questions prior to the visit, additional usage of staff PPE, and extensive cleaning of exam room while observing appropriate contact time as indicated for disinfecting solutions.   Assessment & Plan:

## 2019-08-23 NOTE — Patient Instructions (Signed)
We have sent in the vyvanse so let us know if this is working.

## 2019-08-24 ENCOUNTER — Encounter: Payer: Self-pay | Admitting: Internal Medicine

## 2019-08-24 NOTE — Assessment & Plan Note (Signed)
Overall is improving with control of mood and attention. Still not great and using otc medications for sleep intermittently.

## 2019-08-24 NOTE — Assessment & Plan Note (Signed)
Improved and would liek to continue lexapro 10 mg daily. Will keep monitoring and she will call with changes.

## 2019-08-24 NOTE — Assessment & Plan Note (Signed)
Increase vyvanse to 20 mg daily. Given 30 day supply, if working well will send in 90 day rx with refill.

## 2019-09-16 ENCOUNTER — Encounter: Payer: Self-pay | Admitting: Internal Medicine

## 2019-09-20 MED ORDER — LISDEXAMFETAMINE DIMESYLATE 20 MG PO CAPS: 20.0000 mg | ORAL_CAPSULE | Freq: Every day | ORAL | 0 refills | Status: DC

## 2019-10-11 ENCOUNTER — Other Ambulatory Visit: Payer: Self-pay | Admitting: Obstetrics & Gynecology

## 2019-10-12 NOTE — Telephone Encounter (Signed)
Medication refill request: Vitamin D 50,000 twice weekly Last AEX: 12/01/2018  Next AEX: 02/05/2020 Last MMG (if hormonal medication request): N/A Refill authorized: Vitamin D 50,000 IU twice weekly #24 1RF sent to pharmacy on file to get patient to her next aex appointment

## 2019-11-05 IMAGING — MR MRI LUMBAR SPINE WITHOUT CONTRAST
4 of 5 series · 18 of 48 positions shown · non-contrast
Comparison: None.

CLINICAL DATA: Radiculopathy, greater than 6 weeks with
conservative treatment, persistent symptoms. Low back and left hip
pain since July 2018. Tingling and weakness into the left lower
extremity.

EXAM:
MRI LUMBAR SPINE WITHOUT CONTRAST
TECHNIQUE: Multiplanar, multisequence MR imaging of the lumbar spine was
performed. No intravenous contrast was administered.

[Series 6: T2 · sagittal · 4.0mm · 0.73mm/px · 6 of 15 slices shown (1 of 2)]
[im 1/15]
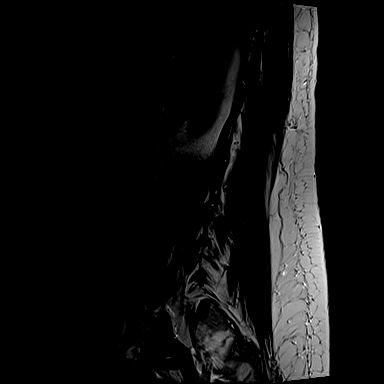
[im 3/15]
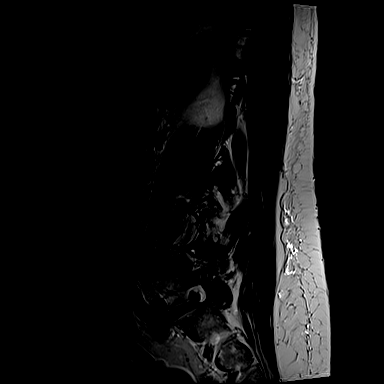
[im 6/15]
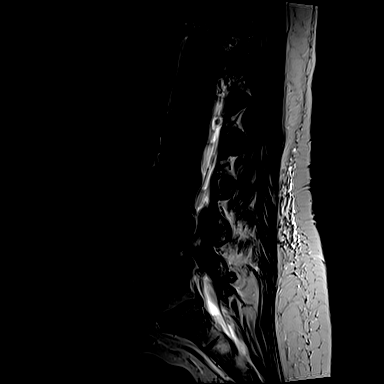
[im 9/15]
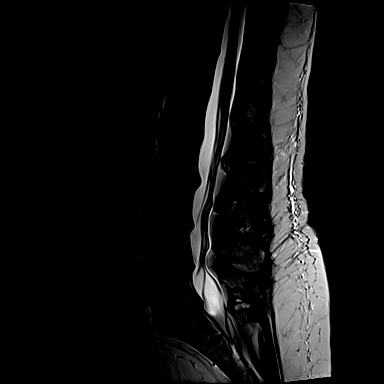
[im 12/15]
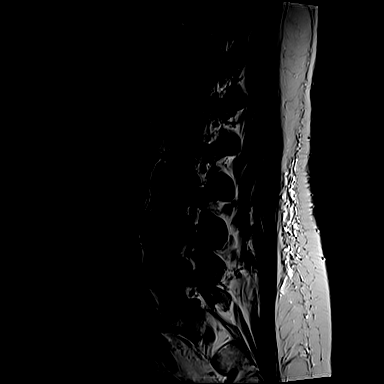
[im 15/15]
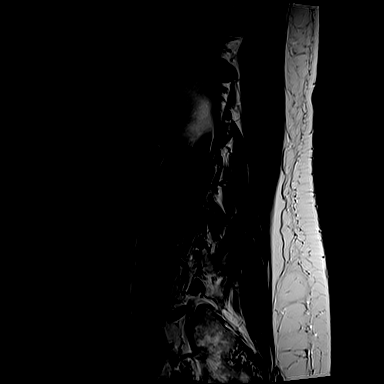

[Series 7: T1 · sagittal · 4.0mm · 0.73mm/px · 3 of 15 slices shown (1 of 2)]
[im 3/15]
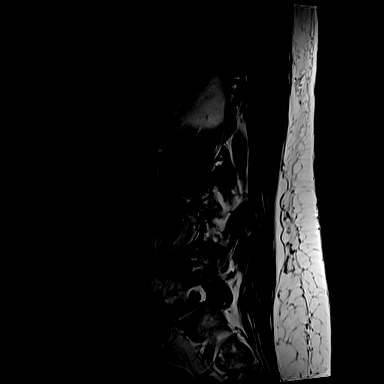
[im 9/15]
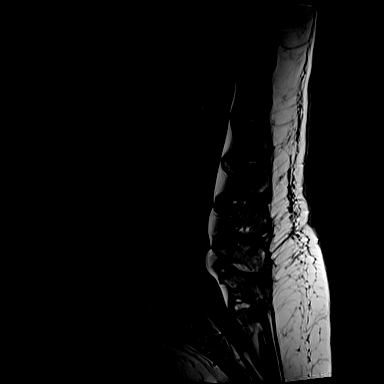
[im 15/15]
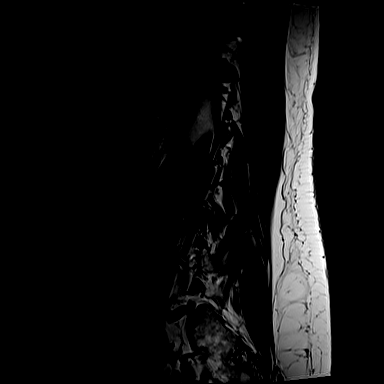

[Series 13: T2 · axial · 4.0mm · 0.28mm/px · z∈[-51,+125]mm · 6 of 39 slices shown (2 of 2)]
[im 1/39]
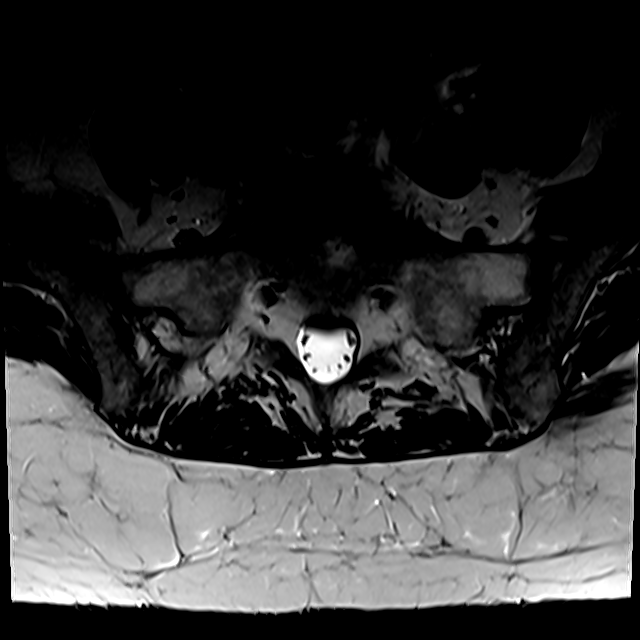
[im 6/39]
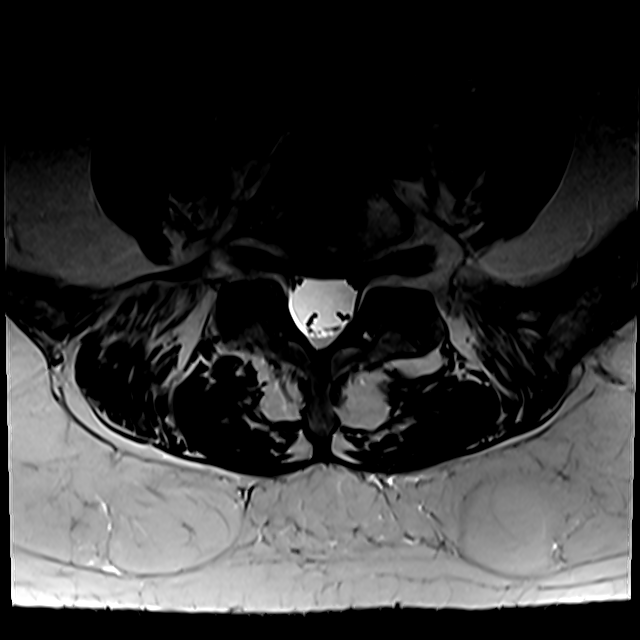
[im 11/39]
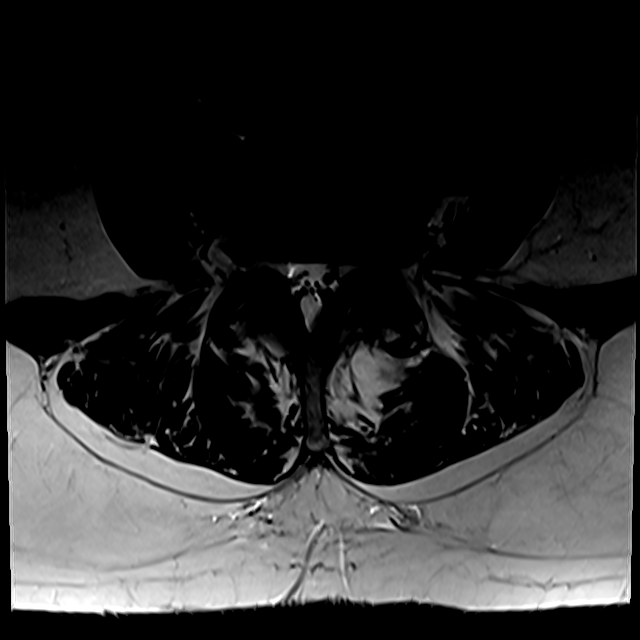
[im 17/39]
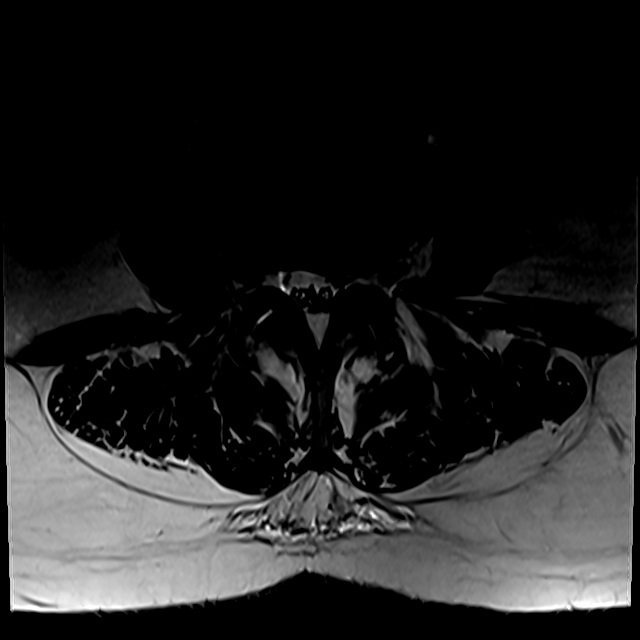
[im 20/39]
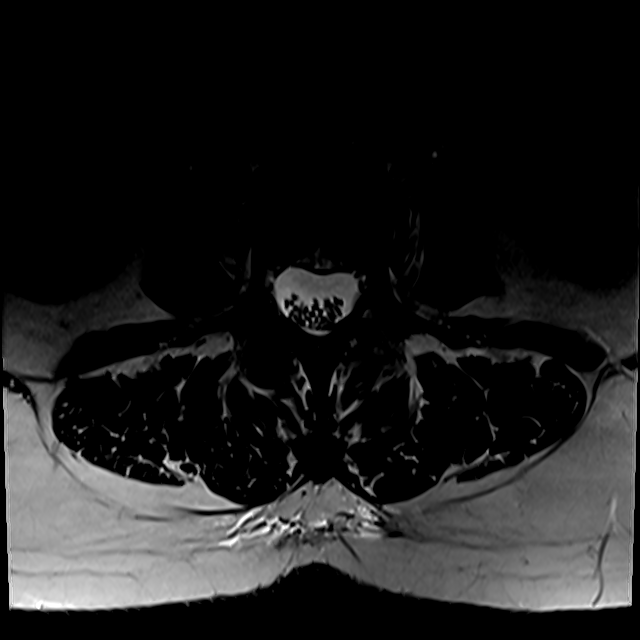
[im 33/39]
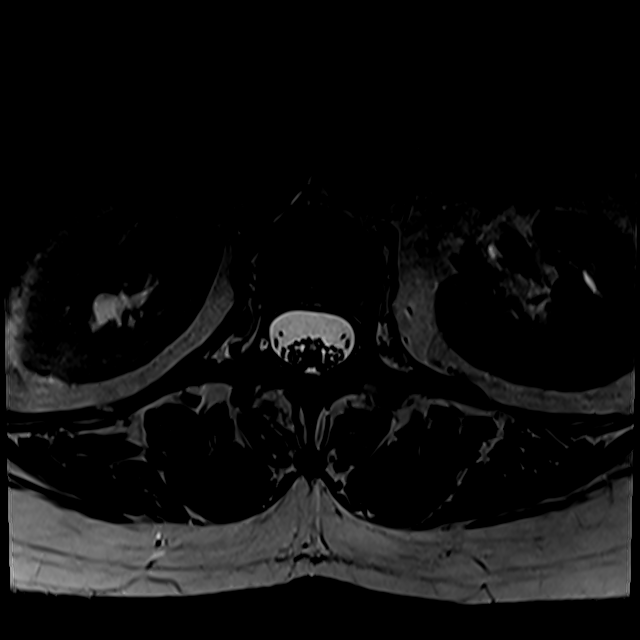

[Series 100: T1 · axial · 4.0mm · 0.28mm/px · z∈[-27,+125]mm · 3 of 39 slices shown (2 of 2)]
[im 6/39]
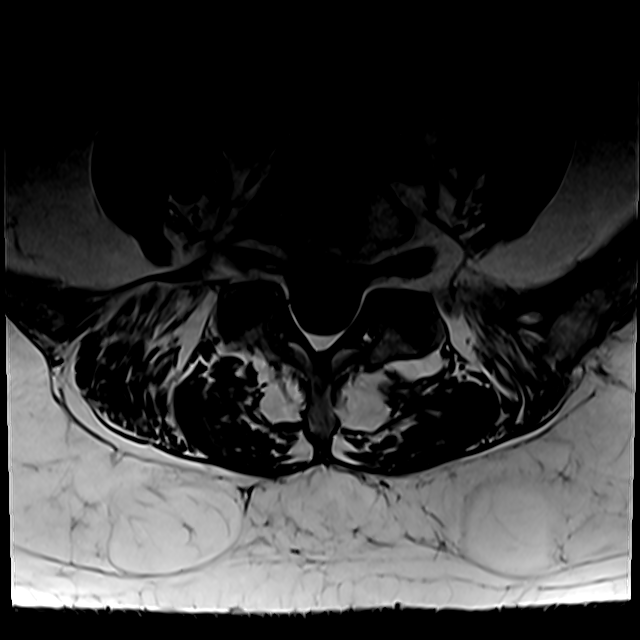
[im 20/39]
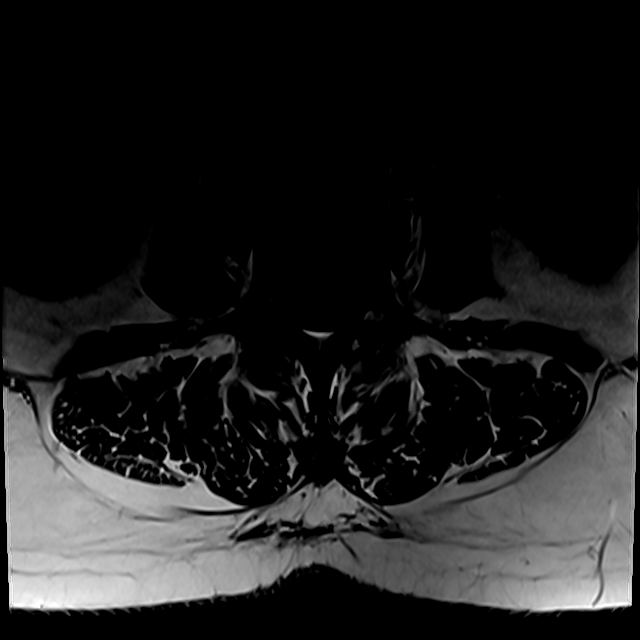
[im 33/39]
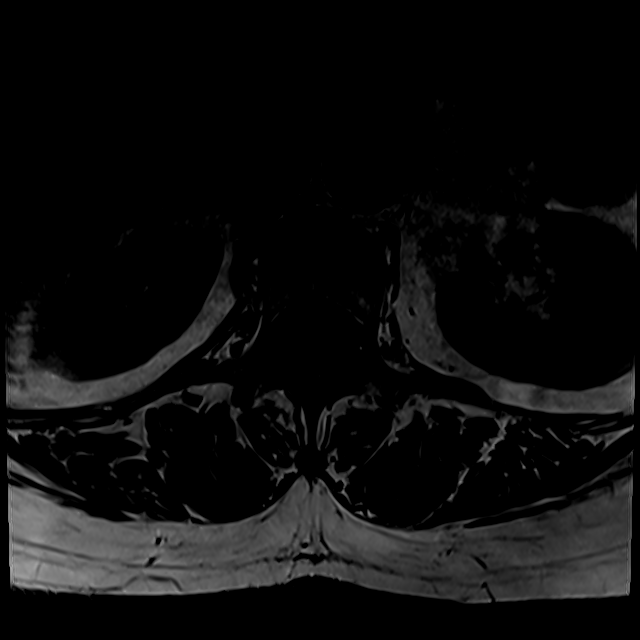

[18 of 48 positions shown; findings below may reference images not displayed]

FINDINGS: Segmentation: 5 non rib-bearing lumbar type vertebral bodies are
present. The lowest fully formed vertebral body is L5.

Alignment: AP alignment is anatomic. Rightward curvature of the
lumbar spine is centered at L4. There is leftward curvature at
L5-S1.

Vertebrae: Chronic endplate marrow changes are present on the left
at L4-5. There is fatty and edematous endplate marrow change.
Chronic fatty endplate marrow changes are present at L5-S1.

Conus medullaris and cauda equina: Conus extends to the L1 level.
Conus and cauda equina appear normal.

Paraspinal and other soft tissues: Limited imaging the abdomen is
unremarkable. There is no significant adenopathy. No solid organ
lesions are present.

Disc levels:

L1-2: Negative.

L2-3: Mild disc bulging and facet hypertrophy are present. There is
no significant stenosis.

L3-4: A leftward disc protrusion is present. There is some
distortion of the central canal without significant stenosis. Disc
extends into the left neural foramen without significant stenosis.

L4-5: A broad-based disc protrusion is present. Mild prominence of
epidural fat is noted. There is no significant central canal
stenosis. Moderate left and mild right foraminal narrowing is
present.

L5-S1: Mild broad-based disc protrusion is present. No significant
stenosis is evident.
IMPRESSION: 1. Moderate left foraminal stenosis to a leftward disc protrusion at
L4-5 secondary and asymmetric left-sided facet hypertrophy and
spurring.
2. Disc disease and facet hypertrophy at L2-3, L3-4, and L5-S1
without significant stenosis at these levels.

## 2019-12-24 ENCOUNTER — Encounter: Payer: Self-pay | Admitting: Internal Medicine

## 2019-12-24 NOTE — Telephone Encounter (Signed)
    Scheduler left message vcml to call office for appointment 

## 2019-12-31 ENCOUNTER — Telehealth (INDEPENDENT_AMBULATORY_CARE_PROVIDER_SITE_OTHER): Payer: No Typology Code available for payment source | Admitting: Family

## 2019-12-31 DIAGNOSIS — F988 Other specified behavioral and emotional disorders with onset usually occurring in childhood and adolescence: Secondary | ICD-10-CM | POA: Diagnosis not present

## 2019-12-31 MED ORDER — LISDEXAMFETAMINE DIMESYLATE 20 MG PO CAPS: 20.0000 mg | ORAL_CAPSULE | Freq: Every day | ORAL | 0 refills | Status: DC

## 2019-12-31 NOTE — Progress Notes (Signed)
ADD Note  Patient Active Problem List   Diagnosis Date Noted  . MDD (major depressive disorder), recurrent episode, moderate ([place]) 07/19/2019  . Insomnia 07/19/2019  . RA (rheumatoid arthritis) ([place]) 02/05/2019  . Degenerative disc disease, lumbar 09/25/2018  . Fibroid 09/08/2018  . Left hip pain 08/08/2018  . Greater trochanteric bursitis of left hip 08/08/2018  . OSA on CPAP 04/10/2018  . Displaced fracture of base of fifth metacarpal bone, right hand, subsequent encounter for fracture with routine healing 01/06/2017  . Overweight (BMI 25.0-29.9) 03/20/2015  . OA (osteoarthritis) of knee 02/24/2015  . Routine general medical examination at a health care facility 09/18/2014  . Attention deficit disorder 03/23/2010  . DVT 06/07/2007    Subjective:   I connected with Susan Esparza on 12/31/19 at  8:40 AM EDT by a video enabled telemedicine application and verified that I am speaking with the correct person using two identifiers.   I discussed the limitations of evaluation and management by telemedicine and the availability of in person appointments. The patient expressed understanding and agreed to proceed. Provider in office/ patient is at home; provider and patient are only 2 people on video call.    Susan Esparza presents in follow up of ADD. She is in her baseline state of health. She continues to tolerate her medicine without adverse effect. She denies palpitation, chest discomfort, appetite or weight changes. No labile mood, relationship with friends, family, coworkers is good. The medicine continues to help with concentrate and focus. She takes a break from the medication on days off.   Objective:  There were no vitals filed for this visit.  General: Well developed, well nourished, in no acute distress  Lungs: Respirations unlabored;  Psych: Appropriate affect, clear thought process, demonstrates appropriate judgement   Assessment:  1. Attention deficit disorder, unspecified  hyperactivity presence     Plan:  Symptoms are stable. 3 serial 1 month, post-dated prescriptions for the ADD medicine are provided.  Plan to see her PCP in 3-4 months;  Time spent 12 minutes  No follow-ups on file.  Requested Prescriptions   Signed Prescriptions Disp Refills  . lisdexamfetamine (VYVANSE) 20 MG capsule 90 capsule 0    Sig: Take 1 capsule (20 mg total) by mouth daily.

## 2020-01-12 ENCOUNTER — Other Ambulatory Visit: Payer: Self-pay | Admitting: Internal Medicine

## 2020-02-05 ENCOUNTER — Other Ambulatory Visit (HOSPITAL_COMMUNITY)
Admission: RE | Admit: 2020-02-05 | Discharge: 2020-02-05 | Disposition: A | Discharge status: Home / Self Care | Payer: No Typology Code available for payment source | Source: Ambulatory Visit | Attending: Obstetrics & Gynecology | Admitting: Obstetrics & Gynecology

## 2020-02-05 ENCOUNTER — Encounter: Payer: Self-pay | Admitting: Obstetrics & Gynecology

## 2020-02-05 ENCOUNTER — Ambulatory Visit (INDEPENDENT_AMBULATORY_CARE_PROVIDER_SITE_OTHER): Payer: No Typology Code available for payment source | Admitting: Obstetrics & Gynecology

## 2020-02-05 ENCOUNTER — Other Ambulatory Visit: Payer: Self-pay

## 2020-02-05 VITALS — BP 124/76 | HR 64 | Resp 14 | Ht 64.25 in | Wt 204.0 lb

## 2020-02-05 DIAGNOSIS — Z01419 Encounter for gynecological examination (general) (routine) without abnormal findings: Secondary | ICD-10-CM

## 2020-02-05 DIAGNOSIS — E559 Vitamin D deficiency, unspecified: Secondary | ICD-10-CM

## 2020-02-05 DIAGNOSIS — Z124 Encounter for screening for malignant neoplasm of cervix: Secondary | ICD-10-CM | POA: Insufficient documentation

## 2020-02-05 MED ORDER — VITAMIN D (ERGOCALCIFEROL) 1.25 MG (50000 UNIT) PO CAPS: ORAL_CAPSULE | ORAL | 4 refills | Status: DC

## 2020-02-05 NOTE — Progress Notes (Signed)
64 y.o. [id number] Married White or Caucasian female here for annual exam.  Moved to [place].  Sold home and has condo here.  Grandchildren are here so will keep condo.  Mother in law passed in January.    Saw rheumatologist at [facility] regarding rheumatoid arthritis.  She did have hand changes but no serologic markers.  Has treated with medication that hasn't helped.  She was also tested for hemochromatosis.  She's going to follow-up in one year and have repeat hand x-rays.    Denies vaginal bleeding.    Patient's last menstrual period was 01/04/2008.          Sexually active: Yes.    The current method of family planning is post menopausal status.    Exercising: Yes.    walking Smoker:  no  Health Maintenance: Pap:  05/18/16 Neg:Neg HR HPV  12/03/13 Neg History of abnormal Pap:  no MMG:  03/14/19 BIRADS 2 benign/density a Colonoscopy:  03/10/16 f/u 5 years BMD:   03/14/19 Osteopenia TDaP:  09/18/14 Pneumonia vaccine(s): no Shingrix:   completed Hep C testing: 01/28/15 Neg Screening Labs: PCP   reports that she has never smoked. She has never used smokeless tobacco. She reports current alcohol use. She reports that she does not use drugs.  Past Medical History:  Diagnosis Date  . Allergic rhinitis   . Anxiety   . Arthritis   . Basal cell carcinoma   . Chronic dryness of both eyes   . Depression   . DVT (deep vein thrombosis) in pregnancy 2009   left leg after knee Arthroscopy  . Dysrhythmia    HX of "Bigemini"  . Numbness    rt hand  . Rheumatoid arthritis ([place]) 2018  . Sleep apnea    "borderline"  uses c-pap    Past Surgical History:  Procedure Laterality Date  . ENDOMETRIAL ABLATION  2006  . FACIAL COSMETIC SURGERY  2006  . KNEE ARTHROSCOPY Right 1989  . KNEE ARTHROSCOPY Left 2009  . MOUTH SURGERY  01/2018  . NASAL POLYP SURGERY  01/2008      . TOTAL KNEE ARTHROPLASTY Right 02/24/2015   Procedure: TOTAL RIGHT KNEE ARTHROPLASTY;  Surgeon: Gaynelle Arabian, MD;   Location: [facility];  Service: Orthopedics;  Laterality: Right;    Current Outpatient Medications  Medication Sig Dispense Refill  . celecoxib (CELEBREX) 200 MG capsule Take by mouth.    . escitalopram (LEXAPRO) 10 MG tablet TAKE 1 TABLET BY MOUTH EVERY DAY 90 tablet 1  . gabapentin (NEURONTIN) 100 MG capsule TAKE 2 CAPSULES BY MOUTH AT BEDTIME. 180 capsule 2  . Vitamin D, Ergocalciferol, (DRISDOL) 1.25 MG (50000 UNIT) CAPS capsule TAKE 1 CAPSULE BY MOUTH TWICE A WEEK 24 capsule 1  . lisdexamfetamine (VYVANSE) 20 MG capsule Take 1 capsule (20 mg total) by mouth daily. (Patient not taking: Reported on 02/05/2020) 90 capsule 0   No current facility-administered medications for this visit.    Family History  Problem Relation Age of Onset  . Hypertension Mother   . Osteoarthritis Mother   . Alzheimer's disease Mother   . Cancer Father        colon cancer  . Heart disease Father        tacycardia  . Diabetes Maternal Grandfather     Review of Systems  All other systems reviewed and are negative.   Exam:   BP 124/76 (BP Location: Right Arm, Patient Position: Sitting, Cuff Size: Normal)   Pulse 64  Resp 14   Ht 5' 4.25" (1.632 m)   Wt 204 lb (92.5 kg)   LMP 01/04/2008   BMI 34.74 kg/m   Height: 5' 4.25" (163.2 cm)  General appearance: alert, cooperative and appears stated age Head: Normocephalic, without obvious abnormality, atraumatic Neck: no adenopathy, supple, symmetrical, trachea midline and thyroid normal to inspection and palpation Lungs: clear to auscultation bilaterally Breasts: normal appearance, no masses or tenderness Heart: regular rate and rhythm Abdomen: soft, non-tender; bowel sounds normal; no masses,  no organomegaly Extremities: extremities normal, atraumatic, no cyanosis or edema Skin: Skin color, texture, turgor normal. No rashes or lesions Lymph nodes: Cervical, supraclavicular, and axillary nodes normal. No abnormal inguinal nodes palpated Neurologic:  Grossly normal   Pelvic: External genitalia:  no lesions              Urethra:  normal appearing urethra with no masses, tenderness or lesions              Bartholins and Skenes: normal                 Vagina: normal appearing vagina with normal color and discharge, no lesions              Cervix: no lesions              Pap taken: Yes.   Bimanual Exam:  Uterus:  normal size, contour, position, consistency, mobility, non-tender              Adnexa: normal adnexa and no mass, fullness, tenderness               Rectovaginal: Confirms               Anus:  normal sphincter tone, no lesions  Chaperone, Terence Lux, CMA, was present for exam.  A:  Well Woman with normal exam PMP, no HRT H/o colonic polyps Vit D deficiency Possible rheumatoid arthritis Uterine  H/o DVT after knee surgery Osteopenia  P:   Mammogram guidelines reviewed pap smear with HR HPV obtained today Vit D obtained today Vit D 50K, 1 orally twice weekly.  #24/4RF Had extensive blood work at [facility] in March Colonoscopy due 2022 BMD 2019 return annually or prn

## 2020-02-06 LAB — CYTOLOGY - PAP
Comment: NEGATIVE
Diagnosis: NEGATIVE
High risk HPV: NEGATIVE

## 2020-02-06 LAB — VITAMIN D 25 HYDROXY (VIT D DEFICIENCY, FRACTURES): Vit D, 25-Hydroxy: 55.3 ng/mL (ref 30.0–100.0)

## 2020-02-11 ENCOUNTER — Other Ambulatory Visit: Payer: Self-pay | Admitting: Internal Medicine

## 2020-04-02 ENCOUNTER — Encounter: Payer: Self-pay | Admitting: Internal Medicine

## 2020-04-03 MED ORDER — GABAPENTIN 100 MG PO CAPS: 200.0000 mg | ORAL_CAPSULE | Freq: Every day | ORAL | 3 refills | Status: DC

## 2020-04-03 MED ORDER — LISDEXAMFETAMINE DIMESYLATE 20 MG PO CAPS: 20.0000 mg | ORAL_CAPSULE | Freq: Every day | ORAL | 0 refills | Status: DC

## 2020-07-04 ENCOUNTER — Encounter: Payer: Self-pay | Admitting: Internal Medicine

## 2020-07-08 ENCOUNTER — Other Ambulatory Visit: Payer: Self-pay | Admitting: Internal Medicine

## 2020-07-10 ENCOUNTER — Other Ambulatory Visit: Payer: Self-pay | Admitting: Podiatry

## 2020-07-10 ENCOUNTER — Ambulatory Visit (INDEPENDENT_AMBULATORY_CARE_PROVIDER_SITE_OTHER): Payer: No Typology Code available for payment source | Admitting: Podiatry

## 2020-07-10 ENCOUNTER — Other Ambulatory Visit: Payer: Self-pay

## 2020-07-10 ENCOUNTER — Encounter: Payer: Self-pay | Admitting: Podiatry

## 2020-07-10 ENCOUNTER — Ambulatory Visit (INDEPENDENT_AMBULATORY_CARE_PROVIDER_SITE_OTHER): Payer: No Typology Code available for payment source

## 2020-07-10 DIAGNOSIS — L603 Nail dystrophy: Secondary | ICD-10-CM

## 2020-07-10 DIAGNOSIS — M778 Other enthesopathies, not elsewhere classified: Secondary | ICD-10-CM

## 2020-07-10 DIAGNOSIS — M2021 Hallux rigidus, right foot: Secondary | ICD-10-CM | POA: Diagnosis not present

## 2020-07-10 DIAGNOSIS — M2022 Hallux rigidus, left foot: Secondary | ICD-10-CM | POA: Diagnosis not present

## 2020-07-10 DIAGNOSIS — M2042 Other hammer toe(s) (acquired), left foot: Secondary | ICD-10-CM

## 2020-07-10 MED ORDER — TRIAMCINOLONE ACETONIDE 40 MG/ML IJ SUSP
20.0000 mg | Freq: Once | INTRAMUSCULAR | Status: AC
  Administered 2020-07-10: 20 mg

## 2020-07-12 NOTE — Progress Notes (Signed)
She presents today concerned about the second toe of her left foot states the toenail is thickened at the tip and also has a callus there.  She is concerned that the toenail is ingrown as fast as the rest of the nails and it may be a bit thicker.  Objective: Vital signs are stable she is alert oriented x3 pulses are palpable.  Neurologic sensorium is intact.  Is a mild flexible hammertoe deformity second digit left foot.  She does have an area of reactive hyperkeratosis to the distal aspect of the toe and the toenail is slightly thickened and does have a transverse ridge distally.  I do not see any signs of onychomycosis there is no blistering no open lesions or wounds.  Most likely this is hammertoe deformity with a benign hyperkeratotic lesion associated with the angular deformity of the toe.  Most likely nail dystrophy is similar.  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  Plan: Discussed this with her in great detail today she understands and is amenable to it we will follow-up with [place] on an as-needed basis.

## 2020-07-14 ENCOUNTER — Telehealth (INDEPENDENT_AMBULATORY_CARE_PROVIDER_SITE_OTHER): Payer: No Typology Code available for payment source | Admitting: Internal Medicine

## 2020-07-14 ENCOUNTER — Encounter: Payer: Self-pay | Admitting: Internal Medicine

## 2020-07-14 DIAGNOSIS — F988 Other specified behavioral and emotional disorders with onset usually occurring in childhood and adolescence: Secondary | ICD-10-CM

## 2020-07-14 MED ORDER — LISDEXAMFETAMINE DIMESYLATE 20 MG PO CAPS: 20.0000 mg | ORAL_CAPSULE | Freq: Every day | ORAL | 0 refills | Status: DC

## 2020-07-14 NOTE — Assessment & Plan Note (Signed)
Doing well on vyvanse 20 mg daily. [place] narcotic database reviewed and appropriate. Refill today #90 no refills.

## 2020-07-14 NOTE — Progress Notes (Signed)
Virtual Visit via Video Note  I connected with Susan Esparza on 07/14/20 at 10:20 AM EDT by a video enabled telemedicine application and verified that I am speaking with the correct person using two identifiers.  The patient and the provider were at separate locations throughout the entire encounter. Patient location: home, Provider location: work   I discussed the limitations of evaluation and management by telemedicine and the availability of in person appointments. The patient expressed understanding and agreed to proceed. The patient and the provider were the only parties present for the visit unless noted in HPI below.  History of Present Illness: The patient is a 65 y.o. female with visit for follow up ADD. Started 20 mg vyvanse last spring and has done well with this. Feels that focus is good and appropriate. She denies loss of appetite. Denies palpitations or nausea. Weight is stable. Denies checking BP but denies it being high at any visits. Has been taking vyvanse but not every day. Also has cut on finger with kitchen knife while chopping onion.   Observations/Objective: Appearance: normal, breathing appears normal, finger with cut under the nail horizontal cut with minimal oozing, casual grooming, abdomen does not appear distended, throat normal, mental status is A and O times 3  Assessment and Plan: See problem oriented charting  Follow Up Instructions: use antibiotic ointment on finger, tdap up to date, refill vyvanse  I discussed the assessment and treatment plan with the patient. The patient was provided an opportunity to ask questions and all were answered. The patient agreed with the plan and demonstrated an understanding of the instructions.   The patient was advised to call back or seek an in-person evaluation if the symptoms worsen or if the condition fails to improve as anticipated.  Hoyt Koch, MD

## 2020-08-20 ENCOUNTER — Encounter: Payer: Self-pay | Admitting: Internal Medicine

## 2020-08-21 ENCOUNTER — Encounter: Payer: Self-pay | Admitting: Internal Medicine

## 2020-08-21 ENCOUNTER — Other Ambulatory Visit: Payer: Self-pay

## 2020-08-21 ENCOUNTER — Ambulatory Visit (INDEPENDENT_AMBULATORY_CARE_PROVIDER_SITE_OTHER): Payer: No Typology Code available for payment source | Admitting: Internal Medicine

## 2020-08-21 DIAGNOSIS — H60392 Other infective otitis externa, left ear: Secondary | ICD-10-CM | POA: Diagnosis not present

## 2020-08-21 MED ORDER — NEOMYCIN-POLYMYXIN-HC 3.5-10000-1 OT SUSP: 3.0000 [drp] | Freq: Three times a day (TID) | OTIC | 0 refills | Status: DC

## 2020-08-21 NOTE — Patient Instructions (Signed)
3 drops in the ear, 3 times a day, for 3 days to clear the fluid.   If not improving let us know

## 2020-08-21 NOTE — Progress Notes (Signed)
   Subjective:   Patient ID: Susan Esparza, female    DOB: 1955/12/12, 65 y.o.   MRN: [id number]  HPI The patient is a 65 YO female coming in for ear pain on the left. Recent sinus infection and just finished a course of antibiotics. The sinus infection symptoms have resolved other than left ear pain and reduced hearing in left ear. Denies fevers or chills.   Review of Systems  Constitutional: Negative.   HENT: Positive for ear pain and hearing loss.   Eyes: Negative.   Respiratory: Negative for cough, chest tightness and shortness of breath.   Cardiovascular: Negative for chest pain, palpitations and leg swelling.  Gastrointestinal: Negative for abdominal distention, abdominal pain, constipation, diarrhea, nausea and vomiting.  Musculoskeletal: Negative.   Skin: Negative.   Neurological: Negative.   Psychiatric/Behavioral: Negative.     Objective:  Physical Exam Constitutional:      Appearance: She is well-developed.  HENT:     Head: Normocephalic and atraumatic.     Right Ear: Tympanic membrane, ear canal and external ear normal.     Left Ear: Ear canal and external ear normal. There is no impacted cerumen.     Ears:     Comments: Left TM bulging clear fluid Cardiovascular:     Rate and Rhythm: Normal rate and regular rhythm.  Pulmonary:     Effort: Pulmonary effort is normal. No respiratory distress.     Breath sounds: Normal breath sounds. No wheezing or rales.  Abdominal:     General: Bowel sounds are normal. There is no distension.     Palpations: Abdomen is soft.     Tenderness: There is no abdominal tenderness. There is no rebound.  Musculoskeletal:     Cervical back: Normal range of motion.  Skin:    General: Skin is warm and dry.  Neurological:     Mental Status: She is alert and oriented to person, place, and time.     Coordination: Coordination normal.     Vitals:   08/21/20 1104  BP: 128/72  Pulse: (!) 51  Resp: 18  Temp: 98.4 F (36.9 C)   TempSrc: Oral  SpO2: 98%  Weight: 207 lb 6.4 oz (94.1 kg)  Height: 5' 4.25" (1.632 m)    This visit occurred during the SARS-CoV-2 public health emergency.  Safety protocols were in place, including screening questions prior to the visit, additional usage of staff PPE, and extensive cleaning of exam room while observing appropriate contact time as indicated for disinfecting solutions.   Assessment & Plan:

## 2020-08-22 ENCOUNTER — Encounter: Payer: Self-pay | Admitting: Internal Medicine

## 2020-08-22 DIAGNOSIS — H6092 Unspecified otitis externa, left ear: Secondary | ICD-10-CM | POA: Insufficient documentation

## 2020-08-22 NOTE — Assessment & Plan Note (Signed)
Rx cortisporin ear drops. If hearing does not normalize needs hearing test and reassessment.

## 2020-09-02 ENCOUNTER — Encounter: Payer: Self-pay | Admitting: Internal Medicine

## 2020-11-09 ENCOUNTER — Other Ambulatory Visit: Payer: Self-pay | Admitting: Internal Medicine

## 2020-11-17 ENCOUNTER — Encounter: Payer: Self-pay | Admitting: Internal Medicine

## 2020-11-21 NOTE — Telephone Encounter (Signed)
Last RF per controlled substance database: 07/30/20 Last OV: 08/21/20 Next OV: none scheduled

## 2020-11-21 NOTE — Telephone Encounter (Signed)
LVM instructing pt that she will need to call for appt for medication refill.  Will also respond to [employer].

## 2020-11-24 MED ORDER — LISDEXAMFETAMINE DIMESYLATE 20 MG PO CAPS: 20.0000 mg | ORAL_CAPSULE | Freq: Every day | ORAL | 0 refills | Status: DC

## 2020-11-24 NOTE — Telephone Encounter (Signed)
Refill sent, recommend every 6 month visit can be for this fall sometime.

## 2020-11-24 NOTE — Addendum Note (Signed)
Addended by: Pricilla Holm A on: 11/24/2020 11:39 AM   Modules accepted: Orders

## 2020-11-26 ENCOUNTER — Ambulatory Visit: Payer: No Typology Code available for payment source | Admitting: Internal Medicine

## 2020-12-04 ENCOUNTER — Encounter: Payer: Self-pay | Admitting: Internal Medicine

## 2021-04-30 ENCOUNTER — Encounter (HOSPITAL_BASED_OUTPATIENT_CLINIC_OR_DEPARTMENT_OTHER): Payer: Self-pay | Admitting: *Deleted

## 2021-05-20 ENCOUNTER — Encounter (HOSPITAL_BASED_OUTPATIENT_CLINIC_OR_DEPARTMENT_OTHER): Payer: Self-pay | Admitting: *Deleted

## 2021-05-25 ENCOUNTER — Encounter (HOSPITAL_BASED_OUTPATIENT_CLINIC_OR_DEPARTMENT_OTHER): Payer: Self-pay | Admitting: *Deleted

## 2022-02-09 ENCOUNTER — Encounter: Payer: Self-pay | Admitting: Internal Medicine

## 2022-02-11 ENCOUNTER — Ambulatory Visit (INDEPENDENT_AMBULATORY_CARE_PROVIDER_SITE_OTHER): Payer: Medicare Other | Admitting: Obstetrics & Gynecology

## 2022-02-11 ENCOUNTER — Encounter (HOSPITAL_BASED_OUTPATIENT_CLINIC_OR_DEPARTMENT_OTHER): Payer: Self-pay | Admitting: Obstetrics & Gynecology

## 2022-02-11 VITALS — BP 129/80 | HR 57 | Ht 64.0 in | Wt 231.4 lb

## 2022-02-11 DIAGNOSIS — Z01419 Encounter for gynecological examination (general) (routine) without abnormal findings: Secondary | ICD-10-CM

## 2022-02-11 DIAGNOSIS — Z780 Asymptomatic menopausal state: Secondary | ICD-10-CM | POA: Diagnosis not present

## 2022-02-11 DIAGNOSIS — Z86718 Personal history of other venous thrombosis and embolism: Secondary | ICD-10-CM | POA: Diagnosis not present

## 2022-02-11 DIAGNOSIS — I480 Paroxysmal atrial fibrillation: Secondary | ICD-10-CM | POA: Diagnosis not present

## 2022-02-11 DIAGNOSIS — M8580 Other specified disorders of bone density and structure, unspecified site: Secondary | ICD-10-CM

## 2022-02-11 NOTE — Progress Notes (Signed)
66 y.o. [id number] Married White or Caucasian female here for breast and pelvic exam.  Denies vaginal bleeding.  She is living in [place] now full time.  Going to [employer].  Still has condo in [place] but are considering selling it.    Is seeing rheumatologist at [facility].  After extensive testing, has been diagnosed with osteoarthritis.    Has been diagnosed with afib.  On eliquis and flecainide.  Had echo and monitoring as well as sleep study.  Would like to see more local cardiologist for second opinion.  Referral will be placed.     Patient's last menstrual period was 01/04/2008.          Sexually active: Yes.    H/O STD:  no  Health Maintenance: PCP:  [place].  Last wellness appt was this past month.  Did blood work at that appt:  yes Vaccines are up to date:  reviewed and discussed with pt Colonoscopy:  05/2021.  Follow up 3 years. MMG:  04/28/2021 BMD:  05/21/2021 with osteopenia Last pap smear:  02/05/2020 with neg HR HPV.   H/o abnormal pap smear:  no    reports that she has never smoked. She has never used smokeless tobacco. She reports current alcohol use. She reports that she does not use drugs.  Past Medical History:  Diagnosis Date   Allergic rhinitis    Anxiety    Arthritis    Basal cell carcinoma    Chronic dryness of both eyes    Depression    DVT (deep vein thrombosis) in pregnancy 2009   left leg after knee Arthroscopy   Dysrhythmia    HX of "Bigemini"   Numbness    rt hand   Rheumatoid arthritis ([place]) 2018   Sleep apnea    "borderline"  uses c-pap    Past Surgical History:  Procedure Laterality Date   ENDOMETRIAL ABLATION  2006   FACIAL COSMETIC SURGERY  2006   KNEE ARTHROSCOPY Right 1989   KNEE ARTHROSCOPY Left 2009   MOUTH SURGERY  01/2018   NASAL POLYP SURGERY  01/2008       TOTAL KNEE ARTHROPLASTY Right 02/24/2015   Procedure: TOTAL RIGHT KNEE ARTHROPLASTY;  Surgeon: Gaynelle Arabian, MD;  Location: [facility];   Service: Orthopedics;  Laterality: Right;    Current Outpatient Medications  Medication Sig Dispense Refill   Apixaban (ELIQUIS PO) Take 7.5 mg by mouth daily.     flecainide (TAMBOCOR) 50 MG tablet Take 50 mg by mouth 2 (two) times daily.     meloxicam (MOBIC) 7.5 MG tablet Take 7.5 mg by mouth daily.     Vitamin D, Ergocalciferol, (DRISDOL) 1.25 MG (50000 UNIT) CAPS capsule TAKE 1 CAPSULE BY MOUTH TWICE A WEEK 24 capsule 4   No current facility-administered medications for this visit.    Family History  Problem Relation Age of Onset   Hypertension Mother    Osteoarthritis Mother    Alzheimer's disease Mother    Cancer Father        colon cancer   Heart disease Father        tacycardia   Diabetes Maternal Grandfather     Review of Systems  Constitutional: Negative.     Exam:   BP 129/80 (BP Location: Right Arm, Patient Position: Sitting, Cuff Size: Large)   Pulse (!) 57   Ht '5\' 4"'$  (1.626 m) Comment: REported  Wt 231 lb 6.4 oz (105 kg)   LMP  01/04/2008   BMI 39.72 kg/m   Height: '5\' 4"'$  (162.6 cm) (REported)  General appearance: alert, cooperative and appears stated age Breasts: normal appearance, no masses or tenderness Abdomen: soft, non-tender; bowel sounds normal; no masses,  no organomegaly Lymph nodes: Cervical, supraclavicular, and axillary nodes normal.  No abnormal inguinal nodes palpated Neurologic: Grossly normal  Pelvic: External genitalia:  no lesions              Urethra:  normal appearing urethra with no masses, tenderness or lesions              Bartholins and Skenes: normal                 Vagina: normal appearing vagina with atrophic changes and no discharge, no lesions              Cervix: no lesions              Pap taken: No. Bimanual Exam:  Uterus:  normal size, contour, position, consistency, mobility, non-tender              Adnexa: normal adnexa and no mass, fullness, tenderness               Rectovaginal: Confirms               Anus:   normal sphincter tone, no lesions  Chaperone, Octaviano Batty, CMA, was present for exam.  Assessment/Plan: 1. Encntr for gyn exam (general) (routine) w/o abn findings - Pap smear 02/05/2020 with neg HR HPV.  Guidelines reviewed.  Not obtained today as not indicated. - Mammogram 04/28/2021 - Colonoscopy 05/2021. Follow up 3 years. - Bone mineral density 05/21/2021 - lab work done with [employer] - vaccines reviewed/updated  2. Postmenopausal - not on HRT  3. Paroxysmal atrial fibrillation ([place]) - Ambulatory referral to Cardiology  4. History of DVT (deep vein thrombosis)  5. Osteopenia, unspecified location - plan to repeat BMD 2-3 years

## 2022-02-14 DIAGNOSIS — M8580 Other specified disorders of bone density and structure, unspecified site: Secondary | ICD-10-CM | POA: Insufficient documentation

## 2022-02-23 ENCOUNTER — Encounter: Payer: Self-pay | Admitting: Podiatry

## 2022-02-23 ENCOUNTER — Ambulatory Visit (INDEPENDENT_AMBULATORY_CARE_PROVIDER_SITE_OTHER): Payer: Medicare Other | Admitting: Podiatry

## 2022-02-23 ENCOUNTER — Ambulatory Visit: Payer: Medicare Other

## 2022-02-23 DIAGNOSIS — M205X2 Other deformities of toe(s) (acquired), left foot: Secondary | ICD-10-CM

## 2022-02-23 DIAGNOSIS — M778 Other enthesopathies, not elsewhere classified: Secondary | ICD-10-CM | POA: Diagnosis not present

## 2022-02-23 DIAGNOSIS — M6740 Ganglion, unspecified site: Secondary | ICD-10-CM

## 2022-02-27 ENCOUNTER — Encounter (HOSPITAL_BASED_OUTPATIENT_CLINIC_OR_DEPARTMENT_OTHER): Payer: Self-pay | Admitting: Obstetrics & Gynecology

## 2022-02-28 NOTE — Progress Notes (Signed)
She presents today after having not seen her for a year or so with a chief concern of pain and a small nodule to the proximal nail fold of the third digit left foot.  She states that been there for about 8 months tender seems to be growing into the nail kind of.  She states that she would like to have it cut out if possible.  Objective: Vital signs are stable alert oriented x 3.  Pulses are palpable.  She has a small mucoid cyst extending from a third DIPJ which is arthritic.  This is extending to the level of the eponychium.  No erythema cellulitis drainage or odor.  I prepped and lanced the area today with mucoid cysts.  No signs of infection.  Assessment: Mucoid cyst with osteoarthritic changes DIPJ third digit left foot.  Plan: Lanced today sterile Betadine skin prep no anesthesia was necessary.  Discussed the possible need for arthroplasty.  We did dress the toe today with compression and encouraged her to do so and provided her with Coban.

## 2022-03-04 NOTE — Progress Notes (Signed)
Cardiology Office Note:    Date:  03/04/2022   ID:  Susan Esparza, DOB 1955-04-08, MRN [id number]  PCP:  Pcp, No  Cardiologist:  Buford Dresser, MD  Referring MD: Megan Salon, MD   CC: new patient evaluation for atrial fibrillation   History of Present Illness:    Susan Esparza is a 66 y.o. female with a hx of paroxysmal atrial fibrillation, DVT in pregnancy, rheumatoid arthritis, basal cell carcinoma, depression, and sleep apnea, who is seen as a new consult at the request of Megan Salon, MD for the evaluation and management of paroxysmal atrial fibrillation.  Note from Dr. Sabra Heck reviewed. Referred for history of atrial fibrillation.  Family history: dad had afib. Mom had high blood pressure, high cholesterol, smoker  Has been told she had abnormal heart rhythms years ago, told it was bigeminy by Dr. Arnoldo Morale a long time ago. Wore a monitor, told she had afib. Started on flecainide and apixaban.  I have copies of her outside records today, reviewed below. HR range 31-203, avg HR 60. 926 episodes of SVT (longest 15 beats), 1% burden of atrial fibrillation, longest episode 1 hr 24 minutes.  Echo unremarkable.  Denies any history of stroke or TIA. Has had DVT, provoked after knee arthroscopy.  She denies any palpitations, chest pain, shortness of breath, or peripheral edema. No lightheadedness, headaches, syncope, orthopnea, or PND.  Past Medical History:  Diagnosis Date   Allergic rhinitis    Anxiety    Arthritis    Basal cell carcinoma    Chronic dryness of both eyes    Depression    DVT (deep vein thrombosis) in pregnancy 2009   left leg after knee Arthroscopy   Dysrhythmia    HX of "Bigemini"   Numbness    rt hand   Rheumatoid arthritis ([place]) 2018   Sleep apnea    "borderline"  uses c-pap    Past Surgical History:  Procedure Laterality Date   ENDOMETRIAL ABLATION  2006   FACIAL COSMETIC SURGERY  2006   KNEE ARTHROSCOPY Right 1989   KNEE  ARTHROSCOPY Left 2009   MOUTH SURGERY  01/2018   NASAL POLYP SURGERY  01/2008       TOTAL KNEE ARTHROPLASTY Right 02/24/2015   Procedure: TOTAL RIGHT KNEE ARTHROPLASTY;  Surgeon: Gaynelle Arabian, MD;  Location: [facility];  Service: Orthopedics;  Laterality: Right;    Current Medications: Current Outpatient Medications on File Prior to Visit  Medication Sig   Apixaban (ELIQUIS PO) Take 7.5 mg by mouth daily.   flecainide (TAMBOCOR) 50 MG tablet Take 50 mg by mouth 2 (two) times daily.   meloxicam (MOBIC) 7.5 MG tablet Take 7.5 mg by mouth daily.   Vitamin D, Ergocalciferol, (DRISDOL) 1.25 MG (50000 UNIT) CAPS capsule TAKE 1 CAPSULE BY MOUTH TWICE A WEEK   No current facility-administered medications on file prior to visit.     Allergies:   Fibrin sealant component, Penicillins, and Bupropion hcl   Social History   Tobacco Use   Smoking status: Never   Smokeless tobacco: Never  Vaping Use   Vaping Use: Never used  Substance Use Topics   Alcohol use: Yes    Comment: occasional   Drug use: No    Family History: family history includes Alzheimer's disease in her mother; Cancer in her father; Diabetes in her maternal grandfather; Heart disease in her father; Hypertension in her mother; Osteoarthritis in her mother.  ROS:   Please see the history  of present illness.  Additional pertinent ROS: Constitutional: Negative for chills, fever, night sweats, unintentional weight loss  HENT: Negative for ear pain and hearing loss.   Eyes: Negative for loss of vision and eye pain.  Respiratory: Negative for cough, sputum, wheezing.   Cardiovascular: See HPI. Gastrointestinal: Negative for abdominal pain, melena, and hematochezia.  Genitourinary: Negative for dysuria and hematuria.  Musculoskeletal: Negative for falls and myalgias.  Skin: Negative for itching and rash.  Neurological: Negative for focal weakness, focal sensory changes and loss of consciousness.  Endo/Heme/Allergies: Does not  bruise/bleed easily.     EKGs/Labs/Other Studies Reviewed:    The following studies were reviewed today:  LE Venous Doppler  04/28/2012: venous doppler negative for DVT   Echo 06/29/21 EF 55-60%, indeterminate diastolic function Normal RV Severely dilated LA, LAVI 56 ml/m2 Moderately dilated RA Mild MR No AR/AS Mild TR Trivial PR  Zio 06/29/21 Min HR 31 bpm, max 203 bpm, avg 60 bpm. 926 SVT events, longest 15 beats. 1% burden afib (longest 1 hr 24 min).Frequent PACs (5.9%), rare PVCs. Bradycardia, frequent PVCs noted  EKG:  EKG is personally reviewed.   03/05/2022:  sinus bradycardia at 51 bpm  Recent Labs: No results found for requested labs within last 365 days.   Recent Lipid Panel    Component Value Date/Time   CHOL 145 04/10/2018 0831   TRIG 103.0 04/10/2018 0831   HDL 52.20 04/10/2018 0831   CHOLHDL 3 04/10/2018 0831   VLDL 20.6 04/10/2018 0831   LDLCALC 72 04/10/2018 0831   LDLDIRECT 107.1 04/09/2011 0801    Physical Exam:    VS:  BP 125/74 (BP Location: Left Arm, Patient Position: Sitting, Cuff Size: Normal)   Pulse (!) 51   Ht '5\' 4"'$  (1.626 m)   Wt 173 lb 9.6 oz (78.7 kg)   LMP 01/04/2008   BMI 29.80 kg/m     Wt Readings from Last 3 Encounters:  02/11/22 231 lb 6.4 oz (105 kg)  08/21/20 207 lb 6.4 oz (94.1 kg)  02/05/20 204 lb (92.5 kg)    GEN: Well nourished, well developed in no acute distress HEENT: Normal, moist mucous membranes NECK: No JVD CARDIAC: regular rhythm, normal S1 and S2, no rubs or gallops. No murmur. VASCULAR: Radial and DP pulses 2+ bilaterally. No carotid bruits RESPIRATORY:  Clear to auscultation without rales, wheezing or rhonchi  ABDOMEN: Soft, non-tender, non-distended MUSCULOSKELETAL:  Ambulates independently SKIN: Warm and dry, no edema NEUROLOGIC:  Alert and oriented x 3. No focal neuro deficits noted. PSYCHIATRIC:  Normal affect    ASSESSMENT:    1. Paroxysmal atrial fibrillation (HCC)   2. OSA (obstructive sleep  apnea)   3. PSVT (paroxysmal supraventricular tachycardia)   4. Cardiac risk counseling    PLAN:    Paroxysmal atrial fibrillation Paroxysmal SVT  -CHA2DS2/VAS Stroke Risk Points= 2 (has history of DVT, but this was provoked. No CVA/TIA). One point is for gender. -she would like to come off the flecainide and apixaban -we will repeat monitor in 3 mos off of flecainide -discussed Kardia mobile as well  OSA: had a CPAP when she was heavier, now uses oral appliance since losing weight (50 lbs). Had an updated study just last week, awaiting results.     Cardiac risk counseling and prevention recommendations: -recommend heart healthy/Mediterranean diet, with whole grains, fruits, vegetable, fish, lean meats, nuts, and olive oil. Limit salt. -recommend moderate walking, 3-5 times/week for 30-50 minutes each session. Aim for at least 150 minutes.week.  Goal should be pace of 3 miles/hours, or walking 1.5 miles in 30 minutes -recommend avoidance of tobacco products. Avoid excess alcohol. -ASCVD risk score: The 10-year ASCVD risk score (Arnett DK, et al., 2019) is: 5.5%   Values used to calculate the score:     Age: 73 years     Sex: Female     Is Non-Hispanic African American: No     Diabetic: No     Tobacco smoker: No     Systolic Blood Pressure: 480 mmHg     Is BP treated: No     HDL Cholesterol: 53 mg/dL     Total Cholesterol: 182 mg/dL    Plan for follow up: 3 months or sooner as needed.  Buford Dresser, MD, PhD, [facility]    Medication Adjustments/Labs and Tests Ordered: Current medicines are reviewed at length with the patient today.  Concerns regarding medicines are outlined above.   Orders Placed This Encounter  Procedures   EKG 12-Lead   No orders of the defined types were placed in this encounter.  Patient Instructions  Medication Instructions:  Your physician has recommended you make the following change in your medication:  STOP  Flecainide   STOP Eliquis   *If you need a refill on your cardiac medications before your next appointment, please call your pharmacy*  Follow-Up: At [facility], you and your health needs are our priority.  As part of our continuing mission to provide you with exceptional heart care, we have created designated Provider Care Teams.  These Care Teams include your primary Cardiologist (physician) and Advanced Practice Providers (APPs -  Physician Assistants and Nurse Practitioners) who all work together to provide you with the care you need, when you need it.  We recommend signing up for the patient portal called "MyChart".  Sign up information is provided on this After Visit Summary.  MyChart is used to connect with patients for Virtual Visits (Telemedicine).  Patients are able to view lab/test results, encounter notes, upcoming appointments, etc.  Non-urgent messages can be sent to your provider as well.   To learn more about what you can do with MyChart, go to [email or web address].    Your next appointment:   3 month(s)  The format for your next appointment:   In Person  Provider:   Buford Dresser, MD    Other Instructions [place] for monitoring of EKGs at home: You can look into the [facility] device by [employer].This device is purchased by you and it connects to an application you download to your smart phone.  It can detect abnormal heart rhythms and alert you to contact your doctor for further evaluation. The device is approximately $90 and the phone application is free.  The web site is:  [email or web address]://www.alivecor.com    Signed, Buford Dresser, MD PhD 03/04/2022     [place]

## 2022-03-05 ENCOUNTER — Ambulatory Visit (INDEPENDENT_AMBULATORY_CARE_PROVIDER_SITE_OTHER): Payer: Medicare Other | Admitting: Cardiology

## 2022-03-05 VITALS — BP 125/74 | HR 51 | Ht 64.0 in | Wt 173.6 lb

## 2022-03-05 DIAGNOSIS — I480 Paroxysmal atrial fibrillation: Secondary | ICD-10-CM | POA: Diagnosis not present

## 2022-03-05 DIAGNOSIS — Z7189 Other specified counseling: Secondary | ICD-10-CM

## 2022-03-05 DIAGNOSIS — I471 Supraventricular tachycardia, unspecified: Secondary | ICD-10-CM

## 2022-03-05 DIAGNOSIS — G4733 Obstructive sleep apnea (adult) (pediatric): Secondary | ICD-10-CM

## 2022-03-05 NOTE — Patient Instructions (Addendum)
Medication Instructions:  Your physician has recommended you make the following change in your medication:  STOP Flecainide   STOP Eliquis   *If you need a refill on your cardiac medications before your next appointment, please call your pharmacy*  Follow-Up: At [facility], you and your health needs are our priority.  As part of our continuing mission to provide you with exceptional heart care, we have created designated Provider Care Teams.  These Care Teams include your primary Cardiologist (physician) and Advanced Practice Providers (APPs -  Physician Assistants and Nurse Practitioners) who all work together to provide you with the care you need, when you need it.  We recommend signing up for the patient portal called "MyChart".  Sign up information is provided on this After Visit Summary.  MyChart is used to connect with patients for Virtual Visits (Telemedicine).  Patients are able to view lab/test results, encounter notes, upcoming appointments, etc.  Non-urgent messages can be sent to your provider as well.   To learn more about what you can do with MyChart, go to [email or web address].    Your next appointment:   3 month(s)  The format for your next appointment:   In Person  Provider:   Buford Dresser, MD    Other Instructions [place] for monitoring of EKGs at home: You can look into the [facility] device by [employer].This device is purchased by you and it connects to an application you download to your smart phone.  It can detect abnormal heart rhythms and alert you to contact your doctor for further evaluation. The device is approximately $90 and the phone application is free.  The web site is:  https://www.alivecor.com

## 2022-03-18 ENCOUNTER — Encounter (HOSPITAL_BASED_OUTPATIENT_CLINIC_OR_DEPARTMENT_OTHER): Payer: Self-pay

## 2022-03-18 ENCOUNTER — Encounter (HOSPITAL_BASED_OUTPATIENT_CLINIC_OR_DEPARTMENT_OTHER): Payer: Self-pay | Admitting: Cardiology

## 2022-04-19 ENCOUNTER — Encounter (HOSPITAL_BASED_OUTPATIENT_CLINIC_OR_DEPARTMENT_OTHER): Payer: Self-pay

## 2022-05-11 ENCOUNTER — Encounter (HOSPITAL_BASED_OUTPATIENT_CLINIC_OR_DEPARTMENT_OTHER): Payer: Self-pay | Admitting: *Deleted

## 2022-06-04 ENCOUNTER — Ambulatory Visit (INDEPENDENT_AMBULATORY_CARE_PROVIDER_SITE_OTHER): Payer: Medicare Other | Admitting: Cardiology

## 2022-06-04 ENCOUNTER — Encounter (HOSPITAL_BASED_OUTPATIENT_CLINIC_OR_DEPARTMENT_OTHER): Payer: Self-pay | Admitting: Cardiology

## 2022-06-04 ENCOUNTER — Ambulatory Visit (INDEPENDENT_AMBULATORY_CARE_PROVIDER_SITE_OTHER): Payer: Medicare Other

## 2022-06-04 VITALS — BP 112/76 | HR 62 | Ht 64.0 in | Wt 179.8 lb

## 2022-06-04 DIAGNOSIS — Z7189 Other specified counseling: Secondary | ICD-10-CM | POA: Diagnosis not present

## 2022-06-04 DIAGNOSIS — I480 Paroxysmal atrial fibrillation: Secondary | ICD-10-CM

## 2022-06-04 DIAGNOSIS — G4733 Obstructive sleep apnea (adult) (pediatric): Secondary | ICD-10-CM | POA: Diagnosis not present

## 2022-06-04 DIAGNOSIS — I471 Supraventricular tachycardia, unspecified: Secondary | ICD-10-CM

## 2022-06-04 NOTE — Patient Instructions (Signed)
Medication Instructions:  Your Physician recommend you continue on your current medication as directed.    *If you need a refill on your cardiac medications before your next appointment, please call your pharmacy*  Testing/Procedures: Your physician has recommended that you wear a Zio monitor.   This monitor is a medical device that records the heart's electrical activity. Doctors most often use these monitors to diagnose arrhythmias. Arrhythmias are problems with the speed or rhythm of the heartbeat. The monitor is a small device applied to your chest. You can wear one while you do your normal daily activities. While wearing this monitor if you have any symptoms to push the button and record what you felt. Once you have worn this monitor for the period of time provider prescribed (Usually 14 days), you will return the monitor device in the postage paid box. Once it is returned they will download the data collected and provide [place] with a report which the provider will then review and we will call you with those results. Important tips:  Avoid showering during the first 24 hours of wearing the monitor. Avoid excessive sweating to help maximize wear time. Do not submerge the device, no hot tubs, and no swimming pools. Keep any lotions or oils away from the patch. After 24 hours you may shower with the patch on. Take brief showers with your back facing the shower head.  Do not remove patch once it has been placed because that will interrupt data and decrease adhesive wear time. Push the button when you have any symptoms and write down what you were feeling. Once you have completed wearing your monitor, remove and place into box which has postage paid and place in your outgoing mailbox.  If for some reason you have misplaced your box then call our office and we can provide another box and/or mail it off for you   Follow-Up: At [facility], you and your health needs are our priority.  As  part of our continuing mission to provide you with exceptional heart care, we have created designated Provider Care Teams.  These Care Teams include your primary Cardiologist (physician) and Advanced Practice Providers (APPs -  Physician Assistants and Nurse Practitioners) who all work together to provide you with the care you need, when you need it.  We recommend signing up for the patient portal called "MyChart".  Sign up information is provided on this After Visit Summary.  MyChart is used to connect with patients for Virtual Visits (Telemedicine).  Patients are able to view lab/test results, encounter notes, upcoming appointments, etc.  Non-urgent messages can be sent to your provider as well.   To learn more about what you can do with MyChart, go to [email or web address].    Your next appointment:   1 year(s)  Provider:   Buford Dresser, MD

## 2022-06-04 NOTE — Progress Notes (Signed)
Cardiology Office Note:    Date:  06/04/2022   ID:  Susan Esparza, DOB Mar 09, 1956, MRN [id number]  PCP:  Keane Scrape, DO  Cardiologist:  Buford Dresser, MD  Referring MD: Keane Scrape, DO   CC:  Follow-up.   History of Present Illness:    Susan Esparza is a 67 y.o. female with a hx of paroxysmal atrial fibrillation, DVT in pregnancy, rheumatoid arthritis, basal cell carcinoma, depression, and sleep apnea, who is seen for follow-up today. She was initially seen 03/05/2022 as a new consult at the request of Keane Scrape, DO for the evaluation and management of paroxysmal atrial fibrillation.  CV history: Had been told she had abnormal heart rhythms years ago, told it was bigeminy by Dr. Arnoldo Morale a long time ago. Wore a monitor, told she had afib. Started on flecainide and apixaban.  At her last appointment, we reviewed copies of her outside records. HR range 31-203, avg HR 60. 926 episodes of SVT (longest 15 beats), 1% burden of atrial fibrillation, longest episode 1 hr 24 minutes. She confirmed prior DVT, provoked after knee arthroscopy. She denied history of stroke or TIA. She wanted to come off the flecainide and apixaban. Planned to repeat monitor in 3 mos off of flecainide.   On 05/05/2022 she underwent right reverse total shoulder arthroplasty.  Today, she is feeling well and reports her shoulder surgery went well.   She is feeling better off of the flecainide and apixaban. She has obtained a KardiaMobile for monitoring her heart rhythm. After discussion she would like to proceed with a 2 week heart monitor for peace of mind.  Generally, her palpitations are most noticeable while lying down at night. She believes they have been stable.  Over 2 years she has lost 50 lbs with dieting/Optavia. She is frustrated that her A1c and cholesterol both increased.  She denies any chest pain, shortness of breath, or peripheral edema. No lightheadedness, headaches,  syncope, orthopnea, or PND.   Past Medical History:  Diagnosis Date   Allergic rhinitis    Anxiety    Arthritis    Basal cell carcinoma    Chronic dryness of both eyes    Depression    DVT (deep vein thrombosis) in pregnancy 2009   left leg after knee Arthroscopy   Dysrhythmia    HX of "Bigemini"   Numbness    rt hand   Rheumatoid arthritis ([place]) 2018   Sleep apnea    "borderline"  uses c-pap    Past Surgical History:  Procedure Laterality Date   ENDOMETRIAL ABLATION  2006   FACIAL COSMETIC SURGERY  2006   KNEE ARTHROSCOPY Right 1989   KNEE ARTHROSCOPY Left 2009   MOUTH SURGERY  01/2018   NASAL POLYP SURGERY  01/2008       TOTAL KNEE ARTHROPLASTY Right 02/24/2015   Procedure: TOTAL RIGHT KNEE ARTHROPLASTY;  Surgeon: Gaynelle Arabian, MD;  Location: [facility];  Service: Orthopedics;  Laterality: Right;    Current Medications: Current Outpatient Medications on File Prior to Visit  Medication Sig   aspirin 325 MG tablet Take 325 mg by mouth in the morning and at bedtime.   Celecoxib (CELEBREX PO) Take 1 capsule by mouth daily.   Multiple Vitamin tablet Take 1 tablet by mouth daily.   Omega-3 Fatty Acids (FISH OIL OMEGA-3 PO) Take 1 capsule by mouth daily.   Omeprazole 20 MG TBDD Take 20 mg by mouth daily.   Vitamin D, Ergocalciferol, (DRISDOL) 1.25 MG (50000 UNIT)  CAPS capsule TAKE 1 CAPSULE BY MOUTH TWICE A WEEK   No current facility-administered medications on file prior to visit.     Allergies:   Fibrin sealant component, Penicillins, and Bupropion hcl   Social History   Tobacco Use   Smoking status: Never   Smokeless tobacco: Never  Vaping Use   Vaping Use: Never used  Substance Use Topics   Alcohol use: Yes    Comment: occasional   Drug use: No    Family History: family history includes Alzheimer's disease in her mother; Cancer in her father; Diabetes in her maternal grandfather; Heart disease in her father; Hypertension in her mother; Osteoarthritis in her  mother. dad had afib. Mom had high blood pressure, high cholesterol, smoker  ROS:   Please see the history of present illness. (+) Palpitations All other systems are reviewed and negative.   EKGs/Labs/Other Studies Reviewed:    The following studies were reviewed today:  Echo 06/29/21 EF 55-60%, indeterminate diastolic function Normal RV Severely dilated LA, LAVI 56 ml/m2 Moderately dilated RA Mild MR No AR/AS Mild TR Trivial PR  Zio 06/29/21 Min HR 31 bpm, max 203 bpm, avg 60 bpm. 926 SVT events, longest 15 beats. 1% burden afib (longest 1 hr 24 min).Frequent PACs (5.9%), rare PVCs. Bradycardia, frequent PVCs noted  LE Venous Doppler  04/28/2012: venous doppler negative for DVT   EKG:  EKG is personally reviewed.   06/04/2022:  EKG was not ordered. 03/05/2022:  sinus bradycardia at 51 bpm  Recent Labs: No results found for requested labs within last 365 days.   Recent Lipid Panel    Component Value Date/Time   CHOL 145 04/10/2018 0831   TRIG 103.0 04/10/2018 0831   HDL 52.20 04/10/2018 0831   CHOLHDL 3 04/10/2018 0831   VLDL 20.6 04/10/2018 0831   LDLCALC 72 04/10/2018 0831   LDLDIRECT 107.1 04/09/2011 0801    Physical Exam:    VS:  BP 112/76 (BP Location: Left Arm, Patient Position: Sitting, Cuff Size: Normal)   Pulse 62   Ht '5\' 4"'$  (1.626 m)   Wt 179 lb 12.8 oz (81.6 kg)   LMP 01/04/2008   SpO2 96%   BMI 30.86 kg/m     Wt Readings from Last 3 Encounters:  06/04/22 179 lb 12.8 oz (81.6 kg)  03/05/22 173 lb 9.6 oz (78.7 kg)  02/11/22 231 lb 6.4 oz (105 kg)    GEN: Well nourished, well developed in no acute distress HEENT: Normal, moist mucous membranes NECK: No JVD CARDIAC: regular rhythm, normal S1 and S2, no rubs or gallops. No murmur. VASCULAR: Radial and DP pulses 2+ bilaterally. No carotid bruits RESPIRATORY:  Clear to auscultation without rales, wheezing or rhonchi  ABDOMEN: Soft, non-tender, non-distended MUSCULOSKELETAL:  Ambulates  independently SKIN: Warm and dry, no edema NEUROLOGIC:  Alert and oriented x 3. No focal neuro deficits noted. PSYCHIATRIC:  Normal affect    ASSESSMENT:    1. Paroxysmal atrial fibrillation (HCC)   2. PSVT (paroxysmal supraventricular tachycardia)   3. OSA on CPAP   4. Cardiac risk counseling     PLAN:    Paroxysmal atrial fibrillation Paroxysmal SVT  -CHA2DS2/VAS Stroke Risk Points= 2 (has history of DVT, but this was provoked. No CVA/TIA). One point is for gender. -see prior visit; we trialed off the flecainide and apixaban. She has not had issues. -discussed watching for symptoms vs. Repeating monitor to assess for silent afib. She would like to pursue monitor. -discussed the current recommendations/uncertainty  about duration of afib and recommendations for anticoagulation  OSA: had a CPAP when she was heavier, now uses oral appliance since losing weight (50 lbs).     Cardiac risk counseling and prevention recommendations: -recommend heart healthy/Mediterranean diet, with whole grains, fruits, vegetable, fish, lean meats, nuts, and olive oil. Limit salt. -recommend moderate walking, 3-5 times/week for 30-50 minutes each session. Aim for at least 150 minutes.week. Goal should be pace of 3 miles/hours, or walking 1.5 miles in 30 minutes -recommend avoidance of tobacco products. Avoid excess alcohol. -ASCVD risk score: The 10-year ASCVD risk score (Arnett DK, et al., 2019) is: 4.7%   Values used to calculate the score:     Age: 55 years     Sex: Female     Is Non-Hispanic African American: No     Diabetic: No     Tobacco smoker: No     Systolic Blood Pressure: [id number] mmHg     Is BP treated: No     HDL Cholesterol: 53 mg/dL     Total Cholesterol: 182 mg/dL    Plan for follow up: 1 year or sooner as needed.  Buford Dresser, MD, PhD, [facility]    Medication Adjustments/Labs and Tests Ordered: Current medicines are reviewed at length with the  patient today.  Concerns regarding medicines are outlined above.   Orders Placed This Encounter  Procedures   LONG TERM MONITOR (3-14 DAYS)   No orders of the defined types were placed in this encounter.  Patient Instructions  Medication Instructions:  Your Physician recommend you continue on your current medication as directed.    *If you need a refill on your cardiac medications before your next appointment, please call your pharmacy*  Testing/Procedures: Your physician has recommended that you wear a Zio monitor.   This monitor is a medical device that records the heart's electrical activity. Doctors most often use these monitors to diagnose arrhythmias. Arrhythmias are problems with the speed or rhythm of the heartbeat. The monitor is a small device applied to your chest. You can wear one while you do your normal daily activities. While wearing this monitor if you have any symptoms to push the button and record what you felt. Once you have worn this monitor for the period of time provider prescribed (Usually 14 days), you will return the monitor device in the postage paid box. Once it is returned they will download the data collected and provide [place] with a report which the provider will then review and we will call you with those results. Important tips:  Avoid showering during the first 24 hours of wearing the monitor. Avoid excessive sweating to help maximize wear time. Do not submerge the device, no hot tubs, and no swimming pools. Keep any lotions or oils away from the patch. After 24 hours you may shower with the patch on. Take brief showers with your back facing the shower head.  Do not remove patch once it has been placed because that will interrupt data and decrease adhesive wear time. Push the button when you have any symptoms and write down what you were feeling. Once you have completed wearing your monitor, remove and place into box which has postage paid and place in your  outgoing mailbox.  If for some reason you have misplaced your box then call our office and we can provide another box and/or mail it off for you   Follow-Up: At [facility], you and your health needs  are our priority.  As part of our continuing mission to provide you with exceptional heart care, we have created designated Provider Care Teams.  These Care Teams include your primary Cardiologist (physician) and Advanced Practice Providers (APPs -  Physician Assistants and Nurse Practitioners) who all work together to provide you with the care you need, when you need it.  We recommend signing up for the patient portal called "MyChart".  Sign up information is provided on this After Visit Summary.  MyChart is used to connect with patients for Virtual Visits (Telemedicine).  Patients are able to view lab/test results, encounter notes, upcoming appointments, etc.  Non-urgent messages can be sent to your provider as well.   To learn more about what you can do with MyChart, go to [email or web address].    Your next appointment:   1 year(s)  Provider:   Buford Dresser, MD      [facility] Stumpf,acting as a scribe for Buford Dresser, MD.,have documented all relevant documentation on the behalf of Buford Dresser, MD,as directed by  Buford Dresser, MD while in the presence of Buford Dresser, MD.  I, Buford Dresser, MD, have reviewed all documentation for this visit. The documentation on 06/04/22 for the exam, diagnosis, procedures, and orders are all accurate and complete.   Signed, Buford Dresser, MD PhD 06/04/2022     [place]

## 2022-07-09 ENCOUNTER — Encounter (HOSPITAL_BASED_OUTPATIENT_CLINIC_OR_DEPARTMENT_OTHER): Payer: Self-pay

## 2022-07-09 NOTE — Telephone Encounter (Signed)
Monitor resulted 3/20, please advise results

## 2022-07-26 ENCOUNTER — Telehealth: Payer: Self-pay | Admitting: Cardiology

## 2022-07-26 NOTE — Telephone Encounter (Signed)
Preliminary monitor report reviewed:  Preliminary monitor report with predominantly NSR. No atrial fibrillation. Brief runs of SVT (fast but regular heart beat in the bottom chamber of the heart) which were short (longest episode 13.8 seconds) and only one of which was a triggered episode. The remainder of her episodes were associated with NSR.   A good result - no changes recommended  [Provider note only - one 9 beat run of VT during sleeping hours which was symptomatic.  No indication for further workup]  Alver Sorrow, NP

## 2022-07-26 NOTE — Telephone Encounter (Signed)
Patient is requesting return call in regards to [facility] results. She states she would like an update on these results and has been waiting awhile for the results. Please advise.

## 2022-07-27 NOTE — Telephone Encounter (Signed)
Released message in [place], patient viewed  Advised to call if any questions

## 2022-07-29 NOTE — Telephone Encounter (Signed)
Preliminary monitor results relayed to patient 07/26/22.   Susan Esparza

## 2022-12-16 ENCOUNTER — Encounter (HOSPITAL_BASED_OUTPATIENT_CLINIC_OR_DEPARTMENT_OTHER): Payer: Self-pay

## 2023-02-06 NOTE — Progress Notes (Unsigned)
Cardiology Office Note:    Date:  02/08/2023   ID:  Susan Esparza, DOB May 09, 1955, MRN [id number]  PCP:  Melody Comas, FNP   [facility] Providers Cardiologist:  Jodelle Red, MD Cardiology APP:  Marcelino Duster, [place]     Referring MD: Gerri Spore, DO   Chief Complaint  Patient presents with   Follow-up  HLD  History of Present Illness:    Susan Esparza is a 67 y.o. female with a hx of PAF, DVT after knee surgery (felt provoked), RA, BCC, depression, and sleep apnea. Heart monitor for palpitations showed 1% burden of Afib and she was started on flecainide and eliquis. She also had SVT on her heart monitor. She was seen by Dr. Cristal Deer and wanted to stop flecainide and eliquis - plan to repeat heart monitor after 3 months of being off flecainide. She has also had significant weight loss but an increase in her cholesterol. She uses an oral appliance for OSA.   Heart monitor did not show recurrence of Afib, but showed brief intermittent SVT. She is no longer taking 325 mg ASA. She is taking fish oil. LDL is  now 145.   She presents for concerns about her lipid panel. She is upset at communication with our office. She feels like no one is looking out for her health. She remains active - recovered from shoulder surgery with [facility]. She can walk 3.5 miles while hiking last weekend. No exertional chest pain. A1c 5.8%.    Past Medical History:  Diagnosis Date   Allergic rhinitis    Anxiety    Arthritis    Basal cell carcinoma    Chronic dryness of both eyes    Depression    DVT (deep vein thrombosis) in pregnancy 2009   left leg after knee Arthroscopy   Dysrhythmia    HX of "Bigemini"   Numbness    rt hand   Rheumatoid arthritis (HCC) 2018   Sleep apnea    "borderline"  uses c-pap    Past Surgical History:  Procedure Laterality Date   ENDOMETRIAL ABLATION  2006   FACIAL COSMETIC SURGERY  2006   KNEE ARTHROSCOPY Right 1989   KNEE  ARTHROSCOPY Left 2009   MOUTH SURGERY  01/2018   NASAL POLYP SURGERY  01/2008       TOTAL KNEE ARTHROPLASTY Right 02/24/2015   Procedure: TOTAL RIGHT KNEE ARTHROPLASTY;  Surgeon: Ollen Gross, MD;  Location: [facility];  Service: Orthopedics;  Laterality: Right;    Current Medications: Current Meds  Medication Sig   Celecoxib (CELEBREX PO) Take 1 capsule by mouth daily.   Multiple Vitamin tablet Take 1 tablet by mouth daily.   Omega-3 Fatty Acids (FISH OIL OMEGA-3 PO) Take 1 capsule by mouth daily.   Omeprazole 20 MG TBDD Take 20 mg by mouth daily.     Allergies:   Fibrin sealant component, Penicillins, and Bupropion hcl   Social History   Socioeconomic History   Marital status: Married    Spouse name: Not on file   Number of children: Not on file   Years of education: Not on file   Highest education level: Not on file  Occupational History   Not on file  Tobacco Use   Smoking status: Never   Smokeless tobacco: Never  Vaping Use   Vaping status: Never Used  Substance and Sexual Activity   Alcohol use: Yes    Comment: occasional   Drug use: No  Sexual activity: Yes    Partners: Male    Birth control/protection: Other-see comments    Comment: vasectomy  Other Topics Concern   Not on file  Social History Narrative   ** Merged History Encounter **       Social Determinants of Health   Financial Resource Strain: Low Risk  (05/19/2022)   Received from [facility], [facility]   Overall Financial Resource Strain (CARDIA)    Difficulty of Paying Living Expenses: Not hard at all  Food Insecurity: No Food Insecurity (05/19/2022)   Received from [facility], [facility]   Hunger Vital Sign    Worried About Running Out of Food in the Last Year: Never true    Ran Out of Food in the Last Year: Never true  Transportation Needs: No Transportation Needs (05/19/2022)   Received from [facility], [facility]   PRAPARE - Transportation    Lack of Transportation  (Medical): No    Lack of Transportation (Non-Medical): No  Physical Activity: Insufficiently Active (05/19/2022)   Received from [facility], [facility]   Exercise Vital Sign    Days of Exercise per Week: 4 days    Minutes of Exercise per Session: 30 min  Stress: No Stress Concern Present (05/19/2022)   Received from [facility], [facility] - Occupational Stress Questionnaire    Feeling of Stress : Not at all  Social Connections: Socially Integrated (05/19/2022)   Received from [facility], [facility]   Social Network    How would you rate your social network (family, work, friends)?: Good participation with social networks     Family History: The patient's family history includes Alzheimer's disease in her mother; Cancer in her father; Diabetes in her maternal grandfather; Heart disease in her father; Hypertension in her mother; Osteoarthritis in her mother.  ROS:   Please see the history of present illness.     All other systems reviewed and are negative.  EKGs/Labs/Other Studies Reviewed:    The following studies were reviewed today:  Cardiac Studies & Procedures         MONITORS  LONG TERM MONITOR (3-14 DAYS) 06/25/2022  Narrative Patch Wear Time:  13 days and 17 hours (2024-03-01T11:41:25-0500 to 2024-03-15T06:32:31-0400)  Patient had a min HR of 41 bpm, max HR of 214 bpm, and avg HR of 63 bpm. Predominant underlying rhythm was Sinus Rhythm. 1 run of Ventricular Tachycardia occurred lasting 9 beats with a max rate of 158 bpm (avg 137 bpm)--occurred while sleeping. 31 Supraventricular Tachycardia runs occurred, the run with the fastest interval lasting 7 beats with a max rate of 214 bpm, the longest lasting 13.8 secs with an avg rate of 105 bpm. No atrial fibrillation, high degree block, or pauses noted. Isolated atrial and ventricular ectopy was rare (<1%). There were 4 triggered events, which were sinus with ectopy.                  Recent Labs: No results found for requested labs within last 365 days.  Recent Lipid Panel    Component Value Date/Time   CHOL 145 04/10/2018 0831   TRIG 103.0 04/10/2018 0831   HDL 52.20 04/10/2018 0831   CHOLHDL 3 04/10/2018 0831   VLDL 20.6 04/10/2018 0831   LDLCALC 72 04/10/2018 0831   LDLDIRECT 107.1 04/09/2011 0801     Risk Assessment/Calculations:    CHA2DS2-VASc Score = 4   This indicates a 4.8% annual risk of  stroke. The patient's score is based upon: CHF History: 0 HTN History: 0 Diabetes History: 0 Stroke History: 2 (DVT post op) Vascular Disease History: 0 Age Score: 1 Gender Score: 1             Physical Exam:    VS:  BP 138/70 (BP Location: Left Arm, Patient Position: Sitting, Cuff Size: Normal)   Pulse 64   Ht 5\' 7"  (1.702 m)   Wt 188 lb 12.8 oz (85.6 kg)   LMP 01/04/2008   SpO2 94%   BMI 29.57 kg/m     Wt Readings from Last 3 Encounters:  02/08/23 188 lb 12.8 oz (85.6 kg)  06/04/22 179 lb 12.8 oz (81.6 kg)  03/05/22 173 lb 9.6 oz (78.7 kg)     GEN:  Well nourished, well developed in no acute distress HEENT: Normal NECK: No JVD; No carotid bruits LYMPHATICS: No lymphadenopathy CARDIAC: RRR, no murmurs, rubs, gallops RESPIRATORY:  Clear to auscultation without rales, wheezing or rhonchi  ABDOMEN: Soft, non-tender, non-distended MUSCULOSKELETAL:  No edema; No deformity  SKIN: Warm and dry NEUROLOGIC:  Alert and oriented x 3 PSYCHIATRIC:  Normal affect   ASSESSMENT:    1. Paroxysmal atrial fibrillation (HCC)   2. PSVT (paroxysmal supraventricular tachycardia) (HCC)   3. OSA (obstructive sleep apnea)   4. Overweight (BMI 25.0-29.9)   5. History of DVT (deep vein thrombosis)    PLAN:    In order of problems listed above:  PAF - no recurrence on repeat heart monitor 06/2022 - no longer on flecainide or eliquis (history of CHA2DS2-VASc Score of 2 for provoked DVT and sex) - she has an apple watch and kardia device,  no recurrence - she has 50 mg flecainide at home and questions PRN dosing, I will defer to Dr. Cristal Deer   Paroxysmal SVT - not on BB - usually palpitations at night   Hyperlipidemia with LDL goal < 100 - on fish oil - last LDL was 145 - will start 10 mg crestor twice weekly x 1 month then 3x weekly with repeat lipids and LFTs   OSA On oral device - compliant   Prediabetes Overweight BMI 29.57 A1c 5.8% She requests advice on GLP-1 for weight loss - I will consult pharmD - also recommend low sodium diet, mediterranean diet and increased fiber   Chart reviewed by pharmD and unfortunately does not qualify for GLT-1 agonist.     Follow up in 6 months.      Medication Adjustments/Labs and Tests Ordered: Current medicines are reviewed at length with the patient today.  Concerns regarding medicines are outlined above.  No orders of the defined types were placed in this encounter.  No orders of the defined types were placed in this encounter.   Patient Instructions  Medication Instructions:  Start Crestor 10 mg twice weekly for 1 month. Then take 10 mg three times weekly.  *If you need a refill on your cardiac medications before your next appointment, please call your pharmacy*   Lab Work: Your physician recommends that you return for lab work in 6 weeks. Fasting Lipid Panel & LFT    Testing/Procedures: NONE ordered at this time of appointment     Follow-Up: At [facility], you and your health needs are our priority.  As part of our continuing mission to provide you with exceptional heart care, we have created designated Provider Care Teams.  These Care Teams include your primary Cardiologist (physician) and Advanced Practice Providers (APPs -  Physician Assistants and Nurse Practitioners) who all work together to provide you with the care you need, when you need it.  We recommend signing up for the patient portal called "MyChart".  Sign up information  is provided on this After Visit Summary.  MyChart is used to connect with patients for Virtual Visits (Telemedicine).  Patients are able to view lab/test results, encounter notes, upcoming appointments, etc.  Non-urgent messages can be sent to your provider as well.   To learn more about what you can do with MyChart, go to [email or web address].    Your next appointment:   6 month(s)  Provider:   Micah Flesher, PA-C           Signed, Roe Rutherford Breanah Faddis, [place]  02/08/2023 2:14 PM    [facility]

## 2023-02-08 ENCOUNTER — Encounter: Payer: Self-pay | Admitting: Physician Assistant

## 2023-02-08 ENCOUNTER — Ambulatory Visit: Payer: Medicare Other | Attending: Physician Assistant | Admitting: Physician Assistant

## 2023-02-08 ENCOUNTER — Other Ambulatory Visit (HOSPITAL_BASED_OUTPATIENT_CLINIC_OR_DEPARTMENT_OTHER): Payer: Self-pay

## 2023-02-08 VITALS — BP 138/70 | HR 64 | Ht 67.0 in | Wt 188.8 lb

## 2023-02-08 DIAGNOSIS — Z86718 Personal history of other venous thrombosis and embolism: Secondary | ICD-10-CM | POA: Diagnosis present

## 2023-02-08 DIAGNOSIS — I480 Paroxysmal atrial fibrillation: Secondary | ICD-10-CM

## 2023-02-08 DIAGNOSIS — G4733 Obstructive sleep apnea (adult) (pediatric): Secondary | ICD-10-CM | POA: Diagnosis not present

## 2023-02-08 DIAGNOSIS — E663 Overweight: Secondary | ICD-10-CM | POA: Diagnosis present

## 2023-02-08 DIAGNOSIS — I471 Supraventricular tachycardia, unspecified: Secondary | ICD-10-CM | POA: Diagnosis not present

## 2023-02-08 MED ORDER — ROSUVASTATIN CALCIUM 10 MG PO TABS: ORAL_TABLET | ORAL | 3 refills | Status: DC

## 2023-02-08 NOTE — Patient Instructions (Addendum)
Medication Instructions:  Start Crestor 10 mg twice weekly for 1 month. Then take 10 mg three times weekly.  *If you need a refill on your cardiac medications before your next appointment, please call your pharmacy*   Lab Work: Your physician recommends that you return for lab work in 6 weeks. Fasting Lipid Panel & LFT    Testing/Procedures: NONE ordered at this time of appointment     Follow-Up: At [facility], you and your health needs are our priority.  As part of our continuing mission to provide you with exceptional heart care, we have created designated Provider Care Teams.  These Care Teams include your primary Cardiologist (physician) and Advanced Practice Providers (APPs -  Physician Assistants and Nurse Practitioners) who all work together to provide you with the care you need, when you need it.  We recommend signing up for the patient portal called "MyChart".  Sign up information is provided on this After Visit Summary.  MyChart is used to connect with patients for Virtual Visits (Telemedicine).  Patients are able to view lab/test results, encounter notes, upcoming appointments, etc.  Non-urgent messages can be sent to your provider as well.   To learn more about what you can do with MyChart, go to [email or web address].    Your next appointment:   6 month(s)  Provider:   Micah Flesher, PA-C

## 2023-02-08 NOTE — Addendum Note (Signed)
Addended by: Lamar Benes on: 02/08/2023 02:37 PM   Modules accepted: Orders

## 2023-03-09 ENCOUNTER — Telehealth: Payer: Self-pay

## 2023-03-09 NOTE — Telephone Encounter (Signed)
Called and spoke to patient who sent MyChart message concerning her BP. She reported while at the dentist yesterday her BP was 161/110. They rechecked it before she left and it was around the same. Patient states she recheck it after she got home and it was 155/86. This morning she checked it and it was 153/86. She does report a slight headache but deny any other symptoms at this time. Patient was scheduled for office visit 12/19 at 1:55 with Valinda Party, PA. Patient told to keep a log of her BP until her appointment and bring it with her. Also advise her if symptoms worsen or new symptoms develop to go to the ED. Patient verbalized understanding and agree.

## 2023-03-10 NOTE — Telephone Encounter (Signed)
Noted  

## 2023-03-19 NOTE — Progress Notes (Unsigned)
Cardiology Office Note:    Date:  03/24/2023   ID:  Susan Esparza, DOB [birth year], MRN [id number]  PCP:  Melody Comas, FNP   [facility] Providers Cardiologist:  Jodelle Red, MD Cardiology APP:  Marcelino Duster, PA     Referring MD: Melody Comas, FNP   Chief Complaint  Patient presents with   Follow-up    HLD, recent diverticulitis, diet recommendations    History of Present Illness:    Susan Esparza is a 67 y.o. female with a hx of PAF, DVT after knee surgery (felt provoked), RA, BCC, depression, and sleep apnea. Heart monitor for palpitations showed 1% burden of Afib and she was started on flecainide and eliquis. She also had SVT on her heart monitor. She was seen by Dr. Cristal Deer and wanted to stop flecainide and eliquis - plan to repeat heart monitor after 3 months of being off flecainide. She has also had significant weight loss but an increase in her cholesterol. She uses an oral appliance for OSA.  Heart monitor did not show recurrence of Afib, but showed brief intermittent SVT. She is on 325 mg ASA and fish oil.   I saw her in clinic 02/08/2023 with concerns about her lipid panel.  She was able to walk 3.5 miles hiking with no exertional chest pain.  I opted to start crestor with excellent results.  She returns today to mainly discuss diet. She was recently diagnosed with diverticulitis on CT in the setting of abdominal pain. She is very confused about dietary recommendations. I continue to recommend mediterranean diet and will place a referral to nutritionist. She also inquires again about GLT-1 agonist - BMI now greater than 30, I have referred to pharmD. She has joined a gym but not gone yet. I think weight training may benefit her best.   Past Medical History:  Diagnosis Date   Allergic rhinitis    Anxiety    Arthritis    Basal cell carcinoma    Chronic dryness of both eyes    Depression    DVT (deep vein thrombosis) in pregnancy  2009   left leg after knee Arthroscopy   Dysrhythmia    HX of "Bigemini"   Numbness    rt hand   Rheumatoid arthritis (HCC) 2018   Sleep apnea    "borderline"  uses c-pap    Past Surgical History:  Procedure Laterality Date   ENDOMETRIAL ABLATION  2006   FACIAL COSMETIC SURGERY  2006   KNEE ARTHROSCOPY Right 1989   KNEE ARTHROSCOPY Left 2009   MOUTH SURGERY  01/2018   NASAL POLYP SURGERY  01/2008       TOTAL KNEE ARTHROPLASTY Right 02/24/2015   Procedure: TOTAL RIGHT KNEE ARTHROPLASTY;  Surgeon: Ollen Gross, MD;  Location: [facility];  Service: Orthopedics;  Laterality: Right;    Current Medications: Current Meds  Medication Sig   Celecoxib (CELEBREX PO) Take 1 capsule by mouth daily.   Multiple Vitamin tablet Take 1 tablet by mouth daily.   Omega-3 Fatty Acids (FISH OIL OMEGA-3 PO) Take 1 capsule by mouth daily.   Omeprazole 20 MG TBDD Take 20 mg by mouth daily.   rosuvastatin (CRESTOR) 10 MG tablet Take 10 mg twice weekly for 1 month, then take 10 mg three times weekly.   [DISCONTINUED] Vitamin D, Ergocalciferol, (DRISDOL) 1.25 MG (50000 UNIT) CAPS capsule TAKE 1 CAPSULE BY MOUTH TWICE A WEEK     Allergies:   Fibrin sealant component,  Penicillins, and Bupropion hcl   Social History   Socioeconomic History   Marital status: Married    Spouse name: Not on file   Number of children: Not on file   Years of education: Not on file   Highest education level: Not on file  Occupational History   Not on file  Tobacco Use   Smoking status: Never   Smokeless tobacco: Never  Vaping Use   Vaping status: Never Used  Substance and Sexual Activity   Alcohol use: Yes    Comment: occasional   Drug use: No   Sexual activity: Yes    Partners: Male    Birth control/protection: Other-see comments    Comment: vasectomy  Other Topics Concern   Not on file  Social History Narrative   ** Merged History Encounter **       Social Drivers of Health   Financial Resource Strain:  Low Risk  (05/19/2022)   Received from [facility], [facility]   Overall Financial Resource Strain (CARDIA)    Difficulty of Paying Living Expenses: Not hard at all  Food Insecurity: No Food Insecurity (05/19/2022)   Received from [facility], [facility]   Hunger Vital Sign    Worried About Running Out of Food in the Last Year: Never true    Ran Out of Food in the Last Year: Never true  Transportation Needs: No Transportation Needs (05/19/2022)   Received from [facility], [facility]   PRAPARE - Transportation    Lack of Transportation (Medical): No    Lack of Transportation (Non-Medical): No  Physical Activity: Insufficiently Active (05/19/2022)   Received from [facility]   Exercise Vital Sign    Days of Exercise per Week: 4 days    Minutes of Exercise per Session: 30 min  Stress: No Stress Concern Present (05/19/2022)   Received from [facility] - Occupational Stress Questionnaire    Feeling of Stress : Not at all  Social Connections: Socially Integrated (05/19/2022)   Received from [facility], [facility]   Social Network    How would you rate your social network (family, work, friends)?: Good participation with social networks     Family History: The patient's family history includes Alzheimer's disease in her mother; Cancer in her father; Diabetes in her maternal grandfather; Heart disease in her father; Hypertension in her mother; Osteoarthritis in her mother.  ROS:   Please see the history of present illness.     All other systems reviewed and are negative.  EKGs/Labs/Other Studies Reviewed:    The following studies were reviewed today:  EKG Interpretation Date/Time:  Thursday March 24 2023 13:45:22 EST Ventricular Rate:  61 PR Interval:  156 QRS Duration:  78 QT Interval:  410 QTC Calculation: 412 R Axis:   23  Text Interpretation: Normal sinus rhythm Normal ECG When  compared with ECG of 17-Feb-2015 11:29, No significant change was found Confirmed by Micah Flesher ([id number]) on 03/24/2023 1:48:57 PM    Recent Labs: 03/22/2023: ALT 15  Recent Lipid Panel    Component Value Date/Time   CHOL 159 03/22/2023 0836   TRIG 79 03/22/2023 0836   HDL 75 03/22/2023 0836   CHOLHDL 2.1 03/22/2023 0836   CHOLHDL 3 04/10/2018 0831   VLDL 20.6 04/10/2018 0831   LDLCALC 69 03/22/2023 0836   LDLDIRECT 107.1 04/09/2011 0801     Risk Assessment/Calculations:    CHA2DS2-VASc Score = 4  This indicates a 4.8% annual risk of stroke. The patient's score is based upon: CHF History: 0 HTN History: 0 Diabetes History: 0 Stroke History: 2 (DVT post op) Vascular Disease History: 0 Age Score: 1 Gender Score: 1             Physical Exam:    VS:  BP 136/88   Pulse 61   Ht 5\' 6"  (1.676 m)   Wt 192 lb 6.4 oz (87.3 kg)   LMP 01/04/2008   SpO2 98%   BMI 31.05 kg/m     Wt Readings from Last 3 Encounters:  03/24/23 192 lb 6.4 oz (87.3 kg)  02/08/23 188 lb 12.8 oz (85.6 kg)  06/04/22 179 lb 12.8 oz (81.6 kg)     GEN:  Well nourished, well developed in no acute distress HEENT: Normal NECK: No JVD; No carotid bruits LYMPHATICS: No lymphadenopathy CARDIAC: RRR, no murmurs, rubs, gallops RESPIRATORY:  Clear to auscultation without rales, wheezing or rhonchi  ABDOMEN: Soft, non-tender, non-distended MUSCULOSKELETAL:  No edema; No deformity  SKIN: Warm and dry NEUROLOGIC:  Alert and oriented x 3 PSYCHIATRIC:  Normal affect   ASSESSMENT:    1. Paroxysmal atrial fibrillation (HCC)   2. Overweight (BMI 25.0-29.9)   3. OSA (obstructive sleep apnea)   4. PSVT (paroxysmal supraventricular tachycardia) (HCC)    PLAN:    In order of problems listed above:  PAF - no recurrence on repeat heart monitor 06/2022 - no longer on flecainide or eliquis (history of CHA2DS2-VASc Score of 4 for provoked DVT, age, and sex) -Has 50 mg of flecainide at home and questions as  needed dosing - bouts of palpitations are infrequent and not bothersome, may in fact be PSVT rather than Afib   Paroxysmal SVT - not on BB - usually palpitations at night - not bothersome   Hyperlipidemia with LDL goal less than 100 - on fish oil - Last LDL was 145, I started 10 mg Crestor twice weekly with up titration to 3 times weekly 03/22/2023: Cholesterol, Total 159; HDL 75; LDL Chol Calc (NIH) 69; Triglycerides 79 Repeat lipid panel greatly improved and she is doing well on 10 mg crestor three times weekly   OSA On oral device   Prediabetes Overweight A1c 5.8%, BMI 29.57 She requests advice on GLP-1 for weight loss at last visit.  I consulted Pharm.D. and she did not qualify. However, he rBMI is now greater than 30 and I have reached back out to pharmD. They will be in touch.   Follow up in 6 months.     Medication Adjustments/Labs and Tests Ordered: Current medicines are reviewed at length with the patient today.  Concerns regarding medicines are outlined above.  Orders Placed This Encounter  Procedures   Amb ref to Medical Nutrition Therapy-MNT   AMB Referral to [facility] Pharm-D   EKG 12-Lead   No orders of the defined types were placed in this encounter.   Patient Instructions  Medication Instructions:  Your physician recommends that you continue on your current medications as directed. Please refer to the Current Medication list given to you today.  *If you need a refill on your cardiac medications before your next appointment, please call your pharmacy*   Lab Work: NONE ordered at this time of appointment    Testing/Procedures: NONE ordered at this time of appointment     Follow-Up: At [facility], you and your health needs are our priority.  As part of our continuing mission to  provide you with exceptional heart care, we have created designated Provider Care Teams.  These Care Teams include your primary Cardiologist (physician) and  Advanced Practice Providers (APPs -  Physician Assistants and Nurse Practitioners) who all work together to provide you with the care you need, when you need it.  We recommend signing up for the patient portal called "MyChart".  Sign up information is provided on this After Visit Summary.  MyChart is used to connect with patients for Virtual Visits (Telemedicine).  Patients are able to view lab/test results, encounter notes, upcoming appointments, etc.  Non-urgent messages can be sent to your provider as well.   To learn more about what you can do with MyChart, go to [email or web address].    Your next appointment:   6 month(s)  Provider:   Jodelle Red, MD    Other Instructions         Signed, Marcelino Duster, PA  03/24/2023 2:47 PM    [facility]

## 2023-03-23 LAB — HEPATIC FUNCTION PANEL
ALT: 15 [IU]/L (ref 0–32)
AST: 17 [IU]/L (ref 0–40)
Albumin: 4.6 g/dL (ref 3.9–4.9)
Alkaline Phosphatase: 102 [IU]/L (ref 44–121)
Bilirubin Total: 1.1 mg/dL (ref 0.0–1.2)
Bilirubin, Direct: 0.32 mg/dL (ref 0.00–0.40)
Total Protein: 7.2 g/dL (ref 6.0–8.5)

## 2023-03-23 LAB — LIPID PANEL
Chol/HDL Ratio: 2.1 {ratio} (ref 0.0–4.4)
Cholesterol, Total: 159 mg/dL (ref 100–199)
HDL: 75 mg/dL (ref >39)
LDL Chol Calc (NIH): 69 mg/dL (ref 0–99)
Triglycerides: 79 mg/dL (ref 0–149)
VLDL Cholesterol Cal: 15 mg/dL (ref 5–40)

## 2023-03-24 ENCOUNTER — Encounter: Payer: Self-pay | Admitting: Physician Assistant

## 2023-03-24 ENCOUNTER — Ambulatory Visit: Payer: Medicare Other | Attending: Physician Assistant | Admitting: Physician Assistant

## 2023-03-24 ENCOUNTER — Telehealth: Payer: Self-pay | Admitting: Pharmacy Technician

## 2023-03-24 ENCOUNTER — Telehealth: Payer: Self-pay

## 2023-03-24 ENCOUNTER — Telehealth: Payer: Self-pay | Admitting: Pharmacist

## 2023-03-24 ENCOUNTER — Other Ambulatory Visit (HOSPITAL_COMMUNITY): Payer: Self-pay

## 2023-03-24 VITALS — BP 136/88 | HR 61 | Ht 66.0 in | Wt 192.4 lb

## 2023-03-24 DIAGNOSIS — I480 Paroxysmal atrial fibrillation: Secondary | ICD-10-CM | POA: Diagnosis not present

## 2023-03-24 DIAGNOSIS — I471 Supraventricular tachycardia, unspecified: Secondary | ICD-10-CM

## 2023-03-24 DIAGNOSIS — G4733 Obstructive sleep apnea (adult) (pediatric): Secondary | ICD-10-CM

## 2023-03-24 DIAGNOSIS — E663 Overweight: Secondary | ICD-10-CM | POA: Diagnosis present

## 2023-03-24 NOTE — Telephone Encounter (Addendum)
Results viewed by patient via Mychart.----- Message from Roe Rutherford Duke sent at 03/23/2023  9:52 AM EST ----- Your labs show good liver function and improved lipid panel. Your LDL reduced from 145 to 69 - this is a fantastic reduction. Please keep taking your statin medication.

## 2023-03-24 NOTE — Telephone Encounter (Signed)
Pharmacy Patient Advocate Encounter   Received notification from Pt Calls Messages that prior authorization for zepbound is required/requested.   Insurance verification completed.   The patient is insured through [facility] .   Per test claim: PA required; PA submitted to above mentioned insurance via CoverMyMeds Key/confirmation #/EOC [id number] Status is pending

## 2023-03-24 NOTE — Patient Instructions (Signed)
Medication Instructions:  Your physician recommends that you continue on your current medications as directed. Please refer to the Current Medication list given to you today.  *If you need a refill on your cardiac medications before your next appointment, please call your pharmacy*   Lab Work: NONE ordered at this time of appointment    Testing/Procedures: NONE ordered at this time of appointment     Follow-Up: At [facility], you and your health needs are our priority.  As part of our continuing mission to provide you with exceptional heart care, we have created designated Provider Care Teams.  These Care Teams include your primary Cardiologist (physician) and Advanced Practice Providers (APPs -  Physician Assistants and Nurse Practitioners) who all work together to provide you with the care you need, when you need it.  We recommend signing up for the patient portal called "MyChart".  Sign up information is provided on this After Visit Summary.  MyChart is used to connect with patients for Virtual Visits (Telemedicine).  Patients are able to view lab/test results, encounter notes, upcoming appointments, etc.  Non-urgent messages can be sent to your provider as well.   To learn more about what you can do with MyChart, go to [email or web address].    Your next appointment:   6 month(s)  Provider:   Jodelle Red, MD    Other Instructions

## 2023-03-24 NOTE — Telephone Encounter (Signed)
Pharmacy Patient Advocate Encounter   Received notification from Pt Calls Messages that prior authorization for wegovy is required/requested.   Insurance verification completed.   The patient is insured through [facility] .   Per test claim: PA required; PA submitted to above mentioned insurance via CoverMyMeds Key/confirmation #/EOC [id number] Status is pending

## 2023-03-24 NOTE — Telephone Encounter (Signed)
Weight loss Glp 1- coverage assessment   sent to the pool

## 2023-03-25 ENCOUNTER — Telehealth: Payer: Self-pay | Admitting: Physician Assistant

## 2023-03-25 NOTE — Telephone Encounter (Signed)
Pharmacy Patient Advocate Encounter  Received notification from [facility] that Prior Authorization for zepbound has been DENIED.  Full denial letter will be uploaded to the media tab. See denial reason below.   PA #/Case ID/Reference #: [id number]

## 2023-03-25 NOTE — Telephone Encounter (Signed)
Call to patient and she stated she was returning call to pharmD

## 2023-03-25 NOTE — Telephone Encounter (Signed)
Returning call to nurse °

## 2023-03-28 NOTE — Telephone Encounter (Signed)
Pharmacy Patient Advocate Encounter  Received notification from [facility] that Prior Authorization for wegovy has been DENIED.  See denial reason below. No denial letter attached in CMM. Will attach denial letter to Media tab once received.   PA #/Case ID/Reference #: [id number]

## 2023-04-01 NOTE — Telephone Encounter (Signed)
Spoke to pt and made aware of denials for Wegovy and Zepbound.

## 2023-07-23 ENCOUNTER — Encounter (HOSPITAL_BASED_OUTPATIENT_CLINIC_OR_DEPARTMENT_OTHER): Payer: Self-pay | Admitting: Obstetrics & Gynecology

## 2024-03-06 ENCOUNTER — Telehealth: Payer: Self-pay | Admitting: Physician Assistant

## 2024-03-09 ENCOUNTER — Encounter (HOSPITAL_BASED_OUTPATIENT_CLINIC_OR_DEPARTMENT_OTHER): Payer: Self-pay

## 2024-03-09 ENCOUNTER — Other Ambulatory Visit: Payer: Self-pay

## 2024-03-09 MED ORDER — ROSUVASTATIN CALCIUM 10 MG PO TABS: ORAL_TABLET | ORAL | 0 refills | Status: AC

## 2024-03-09 NOTE — Telephone Encounter (Signed)
 Refill sent

## 2024-03-09 NOTE — Telephone Encounter (Signed)
 Pt scheduled for 05/03/23

## 2024-03-12 ENCOUNTER — Ambulatory Visit (HOSPITAL_BASED_OUTPATIENT_CLINIC_OR_DEPARTMENT_OTHER): Admitting: Cardiology

## 2024-03-14 MED ORDER — ROSUVASTATIN CALCIUM 10 MG PO TABS: 10.0000 mg | ORAL_TABLET | ORAL | 0 refills | Status: AC

## 2024-05-14 ENCOUNTER — Ambulatory Visit (HOSPITAL_BASED_OUTPATIENT_CLINIC_OR_DEPARTMENT_OTHER): Admitting: Cardiology

## 2024-05-24 ENCOUNTER — Ambulatory Visit (HOSPITAL_BASED_OUTPATIENT_CLINIC_OR_DEPARTMENT_OTHER): Admitting: Cardiology

## 2024-08-06 ENCOUNTER — Ambulatory Visit (HOSPITAL_BASED_OUTPATIENT_CLINIC_OR_DEPARTMENT_OTHER): Admitting: Cardiology
